# Patient Record
Sex: Female | Born: 1970 | Race: Black or African American | Hispanic: No | Smoking: Former smoker
Health system: Southern US, Community
[De-identification: ages and names within clinical notes are randomized; demographics above are authoritative.]

## PROBLEM LIST (undated history)

## (undated) ENCOUNTER — Emergency Department (HOSPITAL_COMMUNITY): Disposition: A | Discharge status: Home / Self Care | Payer: Self-pay

## (undated) DIAGNOSIS — Z8711 Personal history of peptic ulcer disease: Secondary | ICD-10-CM

## (undated) DIAGNOSIS — G8929 Other chronic pain: Secondary | ICD-10-CM

## (undated) DIAGNOSIS — J45909 Unspecified asthma, uncomplicated: Secondary | ICD-10-CM

## (undated) DIAGNOSIS — Z8719 Personal history of other diseases of the digestive system: Secondary | ICD-10-CM

## (undated) DIAGNOSIS — F4310 Post-traumatic stress disorder, unspecified: Secondary | ICD-10-CM

## (undated) DIAGNOSIS — M549 Dorsalgia, unspecified: Secondary | ICD-10-CM

## (undated) DIAGNOSIS — M1990 Unspecified osteoarthritis, unspecified site: Secondary | ICD-10-CM

## (undated) DIAGNOSIS — A749 Chlamydial infection, unspecified: Secondary | ICD-10-CM

## (undated) DIAGNOSIS — R0789 Other chest pain: Secondary | ICD-10-CM

## (undated) DIAGNOSIS — M542 Cervicalgia: Secondary | ICD-10-CM

## (undated) DIAGNOSIS — K922 Gastrointestinal hemorrhage, unspecified: Secondary | ICD-10-CM

## (undated) DIAGNOSIS — F419 Anxiety disorder, unspecified: Secondary | ICD-10-CM

## (undated) DIAGNOSIS — K219 Gastro-esophageal reflux disease without esophagitis: Secondary | ICD-10-CM

## (undated) HISTORY — DX: Anxiety disorder, unspecified: F41.9

## (undated) HISTORY — PX: ENDOMETRIAL ABLATION: SHX621

## (undated) HISTORY — PX: OTHER SURGICAL HISTORY: SHX169

---

## 2003-11-22 ENCOUNTER — Inpatient Hospital Stay (HOSPITAL_COMMUNITY): Admission: AD | Admit: 2003-11-22 | Discharge: 2003-11-22 | Payer: Self-pay | Admitting: *Deleted

## 2004-01-09 ENCOUNTER — Other Ambulatory Visit: Admission: RE | Admit: 2004-01-09 | Discharge: 2004-01-09 | Payer: Self-pay | Admitting: *Deleted

## 2004-10-14 ENCOUNTER — Emergency Department (HOSPITAL_COMMUNITY): Admission: EM | Admit: 2004-10-14 | Discharge: 2004-10-15 | Payer: Self-pay

## 2004-11-09 ENCOUNTER — Emergency Department (HOSPITAL_COMMUNITY): Admission: EM | Admit: 2004-11-09 | Discharge: 2004-11-09 | Payer: Self-pay | Admitting: Emergency Medicine

## 2004-12-30 DIAGNOSIS — F4310 Post-traumatic stress disorder, unspecified: Secondary | ICD-10-CM

## 2004-12-30 HISTORY — PX: OTHER SURGICAL HISTORY: SHX169

## 2004-12-30 HISTORY — DX: Post-traumatic stress disorder, unspecified: F43.10

## 2005-03-25 ENCOUNTER — Emergency Department (HOSPITAL_COMMUNITY): Admission: EM | Admit: 2005-03-25 | Discharge: 2005-03-26 | Payer: Self-pay | Admitting: Emergency Medicine

## 2005-06-17 ENCOUNTER — Emergency Department (HOSPITAL_COMMUNITY): Admission: EM | Admit: 2005-06-17 | Discharge: 2005-06-17 | Payer: Self-pay | Admitting: Emergency Medicine

## 2005-09-23 ENCOUNTER — Emergency Department (HOSPITAL_COMMUNITY): Admission: EM | Admit: 2005-09-23 | Discharge: 2005-09-23 | Payer: Self-pay | Admitting: Emergency Medicine

## 2005-11-10 ENCOUNTER — Inpatient Hospital Stay (HOSPITAL_COMMUNITY): Admission: EM | Admit: 2005-11-10 | Discharge: 2005-11-13 | Payer: Self-pay | Admitting: *Deleted

## 2005-11-13 ENCOUNTER — Emergency Department (HOSPITAL_COMMUNITY): Admission: EM | Admit: 2005-11-13 | Discharge: 2005-11-13 | Payer: Self-pay | Admitting: Emergency Medicine

## 2010-11-11 ENCOUNTER — Emergency Department (HOSPITAL_COMMUNITY): Admission: EM | Admit: 2010-11-11 | Discharge: 2010-11-11 | Payer: Self-pay | Admitting: Emergency Medicine

## 2011-01-08 ENCOUNTER — Emergency Department (HOSPITAL_COMMUNITY)
Admission: EM | Admit: 2011-01-08 | Discharge: 2011-01-08 | Payer: Self-pay | Source: Home / Self Care | Admitting: Emergency Medicine

## 2011-01-14 LAB — URINALYSIS, ROUTINE W REFLEX MICROSCOPIC
Bilirubin Urine: NEGATIVE
Hgb urine dipstick: NEGATIVE
Ketones, ur: NEGATIVE mg/dL
Nitrite: NEGATIVE
Protein, ur: NEGATIVE mg/dL
Specific Gravity, Urine: 1.017 (ref 1.005–1.030)
Urine Glucose, Fasting: NEGATIVE mg/dL
Urobilinogen, UA: 0.2 mg/dL (ref 0.0–1.0)
pH: 7 (ref 5.0–8.0)

## 2011-03-08 ENCOUNTER — Emergency Department (HOSPITAL_COMMUNITY)
Admission: EM | Admit: 2011-03-08 | Discharge: 2011-03-08 | Disposition: A | Discharge status: Home / Self Care | Payer: Medicaid Other | Attending: Emergency Medicine | Admitting: Emergency Medicine

## 2011-03-08 DIAGNOSIS — S335XXA Sprain of ligaments of lumbar spine, initial encounter: Secondary | ICD-10-CM | POA: Insufficient documentation

## 2011-03-08 DIAGNOSIS — K219 Gastro-esophageal reflux disease without esophagitis: Secondary | ICD-10-CM | POA: Insufficient documentation

## 2011-03-08 DIAGNOSIS — M542 Cervicalgia: Secondary | ICD-10-CM | POA: Insufficient documentation

## 2011-03-08 DIAGNOSIS — F3289 Other specified depressive episodes: Secondary | ICD-10-CM | POA: Insufficient documentation

## 2011-03-08 DIAGNOSIS — F329 Major depressive disorder, single episode, unspecified: Secondary | ICD-10-CM | POA: Insufficient documentation

## 2011-03-08 DIAGNOSIS — M545 Low back pain, unspecified: Secondary | ICD-10-CM | POA: Insufficient documentation

## 2011-03-08 DIAGNOSIS — S139XXA Sprain of joints and ligaments of unspecified parts of neck, initial encounter: Secondary | ICD-10-CM | POA: Insufficient documentation

## 2011-04-10 ENCOUNTER — Ambulatory Visit
Payer: No Typology Code available for payment source | Attending: Physical Medicine and Rehabilitation | Admitting: Physical Therapy

## 2011-04-10 DIAGNOSIS — M545 Low back pain, unspecified: Secondary | ICD-10-CM | POA: Insufficient documentation

## 2011-04-10 DIAGNOSIS — M2560 Stiffness of unspecified joint, not elsewhere classified: Secondary | ICD-10-CM | POA: Insufficient documentation

## 2011-04-10 DIAGNOSIS — IMO0001 Reserved for inherently not codable concepts without codable children: Secondary | ICD-10-CM | POA: Insufficient documentation

## 2011-04-18 ENCOUNTER — Emergency Department (HOSPITAL_COMMUNITY)
Admission: EM | Admit: 2011-04-18 | Discharge: 2011-04-18 | Disposition: A | Discharge status: Home / Self Care | Payer: No Typology Code available for payment source | Attending: Emergency Medicine | Admitting: Emergency Medicine

## 2011-04-18 ENCOUNTER — Emergency Department (HOSPITAL_COMMUNITY): Payer: No Typology Code available for payment source

## 2011-04-18 DIAGNOSIS — R209 Unspecified disturbances of skin sensation: Secondary | ICD-10-CM | POA: Insufficient documentation

## 2011-04-18 DIAGNOSIS — Z87828 Personal history of other (healed) physical injury and trauma: Secondary | ICD-10-CM | POA: Insufficient documentation

## 2011-04-18 DIAGNOSIS — M545 Low back pain, unspecified: Secondary | ICD-10-CM | POA: Insufficient documentation

## 2011-04-18 DIAGNOSIS — F17200 Nicotine dependence, unspecified, uncomplicated: Secondary | ICD-10-CM | POA: Insufficient documentation

## 2011-04-18 DIAGNOSIS — M549 Dorsalgia, unspecified: Secondary | ICD-10-CM | POA: Insufficient documentation

## 2011-04-18 DIAGNOSIS — G8929 Other chronic pain: Secondary | ICD-10-CM | POA: Insufficient documentation

## 2011-04-18 LAB — URINALYSIS, ROUTINE W REFLEX MICROSCOPIC
Bilirubin Urine: NEGATIVE
Glucose, UA: NEGATIVE mg/dL
Ketones, ur: NEGATIVE mg/dL
Leukocytes, UA: NEGATIVE
Nitrite: NEGATIVE
Protein, ur: NEGATIVE mg/dL
Specific Gravity, Urine: 1.007 (ref 1.005–1.030)
Urobilinogen, UA: 0.2 mg/dL (ref 0.0–1.0)
pH: 6.5 (ref 5.0–8.0)

## 2011-04-18 LAB — POCT I-STAT, CHEM 8
BUN: 11 mg/dL (ref 6–23)
Calcium, Ion: 1.04 mmol/L — ABNORMAL LOW (ref 1.12–1.32)
Chloride: 105 mEq/L (ref 96–112)
Creatinine, Ser: 1 mg/dL (ref 0.4–1.2)
Glucose, Bld: 82 mg/dL (ref 70–99)
HCT: 46 % (ref 36.0–46.0)
Hemoglobin: 15.6 g/dL — ABNORMAL HIGH (ref 12.0–15.0)
Potassium: 4.4 mEq/L (ref 3.5–5.1)
Sodium: 139 mEq/L (ref 135–145)
TCO2: 22 mmol/L (ref 0–100)

## 2011-04-18 LAB — URINE MICROSCOPIC-ADD ON

## 2011-04-19 LAB — POCT PREGNANCY, URINE: Preg Test, Ur: NEGATIVE

## 2011-04-22 ENCOUNTER — Ambulatory Visit: Payer: No Typology Code available for payment source | Admitting: Physical Therapy

## 2011-04-24 ENCOUNTER — Encounter: Payer: No Typology Code available for payment source | Admitting: Physical Therapy

## 2011-04-29 ENCOUNTER — Ambulatory Visit: Payer: No Typology Code available for payment source | Admitting: Physical Therapy

## 2011-05-01 ENCOUNTER — Encounter: Payer: No Typology Code available for payment source | Admitting: Physical Therapy

## 2011-05-06 ENCOUNTER — Ambulatory Visit
Payer: No Typology Code available for payment source | Attending: Physical Medicine and Rehabilitation | Admitting: Physical Therapy

## 2011-05-06 DIAGNOSIS — IMO0001 Reserved for inherently not codable concepts without codable children: Secondary | ICD-10-CM | POA: Insufficient documentation

## 2011-05-06 DIAGNOSIS — M2560 Stiffness of unspecified joint, not elsewhere classified: Secondary | ICD-10-CM | POA: Insufficient documentation

## 2011-05-06 DIAGNOSIS — M545 Low back pain, unspecified: Secondary | ICD-10-CM | POA: Insufficient documentation

## 2011-05-15 ENCOUNTER — Inpatient Hospital Stay (INDEPENDENT_AMBULATORY_CARE_PROVIDER_SITE_OTHER)
Admission: RE | Admit: 2011-05-15 | Discharge: 2011-05-15 | Disposition: A | Discharge status: Home / Self Care | Payer: Self-pay | Source: Ambulatory Visit | Attending: Family Medicine | Admitting: Family Medicine

## 2011-05-15 DIAGNOSIS — R6889 Other general symptoms and signs: Secondary | ICD-10-CM

## 2011-05-17 NOTE — Discharge Summary (Signed)
NAME:  MEDRITH, VEILLON NO.:  [id number]   MEDICAL RECORD NO.:  [id number]          PATIENT TYPE:  INP   LOCATION:  5005                         FACILITY:  [facility]   PHYSICIAN:  Sheppard Penton. Stacie Acres, M.D.  DATE OF BIRTH:  07-Oct-1971   DATE OF ADMISSION:  11/10/2005  DATE OF DISCHARGE:  11/13/2005                                 DISCHARGE SUMMARY   ADDENDUM:  Ms. Christell Constant did not to discharge yesterday as planned because of  lack of placement. Today the plan is to discharge her to the [facility] where she can check herself into rehab if she so chooses.  Nothing else changed in her hospital course nor in her discharge planning.  Thank you      Earney Hamburg, P.A.    ______________________________  Sheppard Penton. Stacie Acres, M.D.    MJ/MEDQ  D:  11/13/2005  T:  11/13/2005  Job:  [id number]
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
## 2011-06-03 ENCOUNTER — Emergency Department (HOSPITAL_COMMUNITY)
Admission: EM | Admit: 2011-06-03 | Discharge: 2011-06-03 | Disposition: A | Discharge status: Home / Self Care | Payer: Medicaid Other | Attending: Emergency Medicine | Admitting: Emergency Medicine

## 2011-06-03 ENCOUNTER — Emergency Department (HOSPITAL_COMMUNITY): Payer: Medicaid Other

## 2011-06-03 DIAGNOSIS — R062 Wheezing: Secondary | ICD-10-CM | POA: Insufficient documentation

## 2011-06-03 DIAGNOSIS — R6883 Chills (without fever): Secondary | ICD-10-CM | POA: Insufficient documentation

## 2011-06-03 DIAGNOSIS — J40 Bronchitis, not specified as acute or chronic: Secondary | ICD-10-CM | POA: Insufficient documentation

## 2011-06-03 DIAGNOSIS — R059 Cough, unspecified: Secondary | ICD-10-CM | POA: Insufficient documentation

## 2011-06-03 DIAGNOSIS — R0602 Shortness of breath: Secondary | ICD-10-CM | POA: Insufficient documentation

## 2011-06-03 DIAGNOSIS — R05 Cough: Secondary | ICD-10-CM | POA: Insufficient documentation

## 2011-06-03 DIAGNOSIS — R0989 Other specified symptoms and signs involving the circulatory and respiratory systems: Secondary | ICD-10-CM | POA: Insufficient documentation

## 2011-06-03 DIAGNOSIS — J069 Acute upper respiratory infection, unspecified: Secondary | ICD-10-CM | POA: Insufficient documentation

## 2011-06-03 DIAGNOSIS — F3289 Other specified depressive episodes: Secondary | ICD-10-CM | POA: Insufficient documentation

## 2011-06-03 DIAGNOSIS — R0982 Postnasal drip: Secondary | ICD-10-CM | POA: Insufficient documentation

## 2011-06-03 DIAGNOSIS — F329 Major depressive disorder, single episode, unspecified: Secondary | ICD-10-CM | POA: Insufficient documentation

## 2011-06-03 DIAGNOSIS — R0609 Other forms of dyspnea: Secondary | ICD-10-CM | POA: Insufficient documentation

## 2011-06-03 DIAGNOSIS — J3489 Other specified disorders of nose and nasal sinuses: Secondary | ICD-10-CM | POA: Insufficient documentation

## 2011-06-03 LAB — CBC
HCT: 40.7 % (ref 36.0–46.0)
Hemoglobin: 13.9 g/dL (ref 12.0–15.0)
MCH: 30.5 pg (ref 26.0–34.0)
MCHC: 34.2 g/dL (ref 30.0–36.0)
MCV: 89.5 fL (ref 78.0–100.0)

## 2011-06-03 LAB — BASIC METABOLIC PANEL
BUN: 11 mg/dL (ref 6–23)
CO2: 26 mEq/L (ref 19–32)
Calcium: 8.9 mg/dL (ref 8.4–10.5)
Creatinine, Ser: 0.76 mg/dL (ref 0.4–1.2)
Glucose, Bld: 98 mg/dL (ref 70–99)

## 2011-06-03 LAB — DIFFERENTIAL
Basophils Absolute: 0 10*3/uL (ref 0.0–0.1)
Basophils Relative: 0 % (ref 0–1)
Eosinophils Absolute: 0.2 10*3/uL (ref 0.0–0.7)
Eosinophils Relative: 3 % (ref 0–5)
Lymphocytes Relative: 35 % (ref 12–46)
Lymphs Abs: 2.6 10*3/uL (ref 0.7–4.0)
Monocytes Absolute: 1 10*3/uL (ref 0.1–1.0)
Monocytes Relative: 13 % — ABNORMAL HIGH (ref 3–12)
Neutro Abs: 3.8 10*3/uL (ref 1.7–7.7)
Neutrophils Relative %: 50 % (ref 43–77)

## 2011-06-05 ENCOUNTER — Institutional Professional Consult (permissible substitution): Payer: Self-pay | Admitting: Internal Medicine

## 2011-06-13 ENCOUNTER — Ambulatory Visit: Payer: No Typology Code available for payment source | Admitting: Physical Therapy

## 2011-06-18 ENCOUNTER — Ambulatory Visit
Payer: No Typology Code available for payment source | Attending: Physical Medicine and Rehabilitation | Admitting: Physical Therapy

## 2011-06-18 DIAGNOSIS — M545 Low back pain, unspecified: Secondary | ICD-10-CM | POA: Insufficient documentation

## 2011-06-18 DIAGNOSIS — IMO0001 Reserved for inherently not codable concepts without codable children: Secondary | ICD-10-CM | POA: Insufficient documentation

## 2011-06-18 DIAGNOSIS — M2560 Stiffness of unspecified joint, not elsewhere classified: Secondary | ICD-10-CM | POA: Insufficient documentation

## 2011-06-21 ENCOUNTER — Ambulatory Visit: Payer: No Typology Code available for payment source | Admitting: Physical Therapy

## 2011-06-29 ENCOUNTER — Emergency Department (HOSPITAL_COMMUNITY)
Admission: EM | Admit: 2011-06-29 | Discharge: 2011-06-29 | Disposition: A | Discharge status: Home / Self Care | Payer: Medicaid Other | Attending: Emergency Medicine | Admitting: Emergency Medicine

## 2011-06-29 DIAGNOSIS — R51 Headache: Secondary | ICD-10-CM | POA: Insufficient documentation

## 2011-06-29 DIAGNOSIS — R110 Nausea: Secondary | ICD-10-CM | POA: Insufficient documentation

## 2011-06-29 DIAGNOSIS — H53149 Visual discomfort, unspecified: Secondary | ICD-10-CM | POA: Insufficient documentation

## 2011-07-02 ENCOUNTER — Ambulatory Visit
Payer: No Typology Code available for payment source | Attending: Physical Medicine and Rehabilitation | Admitting: Physical Therapy

## 2011-07-02 DIAGNOSIS — M545 Low back pain, unspecified: Secondary | ICD-10-CM | POA: Insufficient documentation

## 2011-07-02 DIAGNOSIS — IMO0001 Reserved for inherently not codable concepts without codable children: Secondary | ICD-10-CM | POA: Insufficient documentation

## 2011-07-02 DIAGNOSIS — M2560 Stiffness of unspecified joint, not elsewhere classified: Secondary | ICD-10-CM | POA: Insufficient documentation

## 2011-07-05 ENCOUNTER — Ambulatory Visit: Payer: No Typology Code available for payment source | Admitting: Physical Therapy

## 2011-07-09 ENCOUNTER — Ambulatory Visit: Payer: No Typology Code available for payment source | Admitting: Physical Therapy

## 2011-07-22 ENCOUNTER — Emergency Department (HOSPITAL_COMMUNITY)
Admission: EM | Admit: 2011-07-22 | Discharge: 2011-07-22 | Disposition: A | Discharge status: Home / Self Care | Payer: No Typology Code available for payment source | Attending: Emergency Medicine | Admitting: Emergency Medicine

## 2011-07-22 DIAGNOSIS — M542 Cervicalgia: Secondary | ICD-10-CM | POA: Insufficient documentation

## 2011-07-22 DIAGNOSIS — R197 Diarrhea, unspecified: Secondary | ICD-10-CM | POA: Insufficient documentation

## 2011-07-22 DIAGNOSIS — G8929 Other chronic pain: Secondary | ICD-10-CM | POA: Insufficient documentation

## 2011-08-01 ENCOUNTER — Ambulatory Visit
Payer: No Typology Code available for payment source | Attending: Physical Medicine and Rehabilitation | Admitting: Physical Therapy

## 2011-08-01 DIAGNOSIS — M2560 Stiffness of unspecified joint, not elsewhere classified: Secondary | ICD-10-CM | POA: Insufficient documentation

## 2011-08-01 DIAGNOSIS — IMO0001 Reserved for inherently not codable concepts without codable children: Secondary | ICD-10-CM | POA: Insufficient documentation

## 2011-08-01 DIAGNOSIS — M545 Low back pain, unspecified: Secondary | ICD-10-CM | POA: Insufficient documentation

## 2011-08-07 ENCOUNTER — Ambulatory Visit: Payer: No Typology Code available for payment source | Admitting: Physical Therapy

## 2011-08-09 ENCOUNTER — Encounter: Payer: Self-pay | Admitting: Physical Therapy

## 2011-08-21 ENCOUNTER — Inpatient Hospital Stay (HOSPITAL_COMMUNITY)
Admission: AD | Admit: 2011-08-21 | Discharge: 2011-08-21 | Disposition: A | Discharge status: Home / Self Care | Payer: Medicaid Other | Source: Ambulatory Visit | Attending: Obstetrics & Gynecology | Admitting: Obstetrics & Gynecology

## 2011-08-21 ENCOUNTER — Encounter (HOSPITAL_COMMUNITY): Payer: Self-pay | Admitting: *Deleted

## 2011-08-21 DIAGNOSIS — M545 Low back pain, unspecified: Secondary | ICD-10-CM | POA: Insufficient documentation

## 2011-08-21 DIAGNOSIS — R519 Headache, unspecified: Secondary | ICD-10-CM

## 2011-08-21 DIAGNOSIS — M542 Cervicalgia: Secondary | ICD-10-CM | POA: Insufficient documentation

## 2011-08-21 DIAGNOSIS — R51 Headache: Secondary | ICD-10-CM

## 2011-08-21 DIAGNOSIS — G44209 Tension-type headache, unspecified, not intractable: Secondary | ICD-10-CM | POA: Insufficient documentation

## 2011-08-21 HISTORY — DX: Gastro-esophageal reflux disease without esophagitis: K21.9

## 2011-08-21 MED ORDER — KETOROLAC TROMETHAMINE 60 MG/2ML IM SOLN
60.0000 mg | Freq: Once | INTRAMUSCULAR | Status: AC
  Administered 2011-08-21: 60 mg via INTRAMUSCULAR

## 2011-08-21 MED ORDER — NAPROXEN SODIUM 550 MG PO TABS: 550.0000 mg | ORAL_TABLET | Freq: Two times a day (BID) | ORAL | Status: DC

## 2011-08-21 NOTE — Progress Notes (Signed)
Pt in c/o severe migraine, neck ache, and lower back pain.  States she has been dealing with this since January.  States she has been going to hospital every other week for pain shots.  States the pain this time started about a hour ago,  [facility] daughter that just had a baby.
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
## 2011-08-21 NOTE — Progress Notes (Signed)
Pt states she has had a headache  For about an hour pt states she has been up her with her daughter who just had a baby. Pt states she has not been able to sleep.Pt states she has a new doctor who has been trying to get all her information from all her doctors and hospitals before she will prescribe pain medication.Pt states she was told to come to the ER.

## 2011-09-06 ENCOUNTER — Emergency Department (HOSPITAL_COMMUNITY)
Admission: EM | Admit: 2011-09-06 | Discharge: 2011-09-06 | Disposition: A | Discharge status: Home / Self Care | Payer: No Typology Code available for payment source | Attending: Emergency Medicine | Admitting: Emergency Medicine

## 2011-09-06 DIAGNOSIS — M549 Dorsalgia, unspecified: Secondary | ICD-10-CM | POA: Insufficient documentation

## 2011-09-06 DIAGNOSIS — K219 Gastro-esophageal reflux disease without esophagitis: Secondary | ICD-10-CM | POA: Insufficient documentation

## 2011-09-06 DIAGNOSIS — G8929 Other chronic pain: Secondary | ICD-10-CM | POA: Insufficient documentation

## 2011-09-06 DIAGNOSIS — M542 Cervicalgia: Secondary | ICD-10-CM | POA: Insufficient documentation

## 2011-10-31 ENCOUNTER — Emergency Department (HOSPITAL_COMMUNITY)
Admission: EM | Admit: 2011-10-31 | Discharge: 2011-10-31 | Disposition: A | Discharge status: Home / Self Care | Payer: Medicaid Other | Attending: Emergency Medicine | Admitting: Emergency Medicine

## 2011-10-31 DIAGNOSIS — R0789 Other chest pain: Secondary | ICD-10-CM | POA: Insufficient documentation

## 2011-10-31 DIAGNOSIS — R209 Unspecified disturbances of skin sensation: Secondary | ICD-10-CM | POA: Insufficient documentation

## 2011-10-31 DIAGNOSIS — M549 Dorsalgia, unspecified: Secondary | ICD-10-CM | POA: Insufficient documentation

## 2011-10-31 DIAGNOSIS — G8929 Other chronic pain: Secondary | ICD-10-CM | POA: Insufficient documentation

## 2011-10-31 DIAGNOSIS — M542 Cervicalgia: Secondary | ICD-10-CM | POA: Insufficient documentation

## 2011-11-26 ENCOUNTER — Encounter (HOSPITAL_COMMUNITY): Payer: Self-pay | Admitting: *Deleted

## 2011-11-26 ENCOUNTER — Emergency Department (HOSPITAL_COMMUNITY)
Admission: EM | Admit: 2011-11-26 | Discharge: 2011-11-26 | Discharge status: Against Medical Advice | Payer: Medicaid Other | Attending: Emergency Medicine | Admitting: Emergency Medicine

## 2011-11-26 DIAGNOSIS — R070 Pain in throat: Secondary | ICD-10-CM | POA: Insufficient documentation

## 2011-11-26 DIAGNOSIS — M542 Cervicalgia: Secondary | ICD-10-CM | POA: Insufficient documentation

## 2011-11-26 NOTE — ED Notes (Signed)
She has a cold for 2-3 days with sorethroat.  She has also had neck pain for 1-2 years

## 2011-11-26 NOTE — ED Notes (Signed)
Called and no answer.

## 2011-12-05 ENCOUNTER — Emergency Department (HOSPITAL_COMMUNITY)
Admission: EM | Admit: 2011-12-05 | Discharge: 2011-12-05 | Disposition: A | Discharge status: Home / Self Care | Payer: Medicaid Other | Attending: Emergency Medicine | Admitting: Emergency Medicine

## 2011-12-05 ENCOUNTER — Encounter (HOSPITAL_COMMUNITY): Payer: Self-pay | Admitting: Emergency Medicine

## 2011-12-05 DIAGNOSIS — R220 Localized swelling, mass and lump, head: Secondary | ICD-10-CM | POA: Insufficient documentation

## 2011-12-05 DIAGNOSIS — M545 Low back pain, unspecified: Secondary | ICD-10-CM | POA: Insufficient documentation

## 2011-12-05 DIAGNOSIS — F329 Major depressive disorder, single episode, unspecified: Secondary | ICD-10-CM | POA: Insufficient documentation

## 2011-12-05 DIAGNOSIS — M542 Cervicalgia: Secondary | ICD-10-CM | POA: Insufficient documentation

## 2011-12-05 DIAGNOSIS — G8929 Other chronic pain: Secondary | ICD-10-CM

## 2011-12-05 DIAGNOSIS — J45909 Unspecified asthma, uncomplicated: Secondary | ICD-10-CM | POA: Insufficient documentation

## 2011-12-05 DIAGNOSIS — F3289 Other specified depressive episodes: Secondary | ICD-10-CM | POA: Insufficient documentation

## 2011-12-05 DIAGNOSIS — R209 Unspecified disturbances of skin sensation: Secondary | ICD-10-CM | POA: Insufficient documentation

## 2011-12-05 DIAGNOSIS — R070 Pain in throat: Secondary | ICD-10-CM | POA: Insufficient documentation

## 2011-12-05 DIAGNOSIS — M549 Dorsalgia, unspecified: Secondary | ICD-10-CM

## 2011-12-05 DIAGNOSIS — K219 Gastro-esophageal reflux disease without esophagitis: Secondary | ICD-10-CM | POA: Insufficient documentation

## 2011-12-05 HISTORY — DX: Dorsalgia, unspecified: M54.9

## 2011-12-05 HISTORY — DX: Other chronic pain: G89.29

## 2011-12-05 MED ORDER — OXYCODONE-ACETAMINOPHEN 5-325 MG PO TABS
ORAL_TABLET | ORAL | Status: AC
  Filled 2011-12-05: qty 2

## 2011-12-05 MED ORDER — DIPHENHYDRAMINE HCL 50 MG/ML IJ SOLN
25.0000 mg | Freq: Once | INTRAMUSCULAR | Status: AC
  Administered 2011-12-05: 25 mg via INTRAMUSCULAR

## 2011-12-05 MED ORDER — OXYCODONE-ACETAMINOPHEN 5-325 MG PO TABS: 2.0000 | ORAL_TABLET | Freq: Once | ORAL | Status: AC

## 2011-12-05 MED ORDER — HYDROMORPHONE HCL PF 2 MG/ML IJ SOLN
2.0000 mg | Freq: Once | INTRAMUSCULAR | Status: AC
  Administered 2011-12-05: 2 mg via INTRAMUSCULAR
  Filled 2011-12-05: qty 1

## 2011-12-05 MED ORDER — DIPHENHYDRAMINE HCL 50 MG/ML IJ SOLN: 25.0000 mg | Freq: Once | INTRAMUSCULAR | Status: DC

## 2011-12-05 MED ORDER — DIPHENHYDRAMINE HCL 50 MG/ML IJ SOLN
INTRAMUSCULAR | Status: AC
  Administered 2011-12-05: 25 mg via INTRAMUSCULAR
  Filled 2011-12-05: qty 1
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
## 2011-12-05 NOTE — ED Notes (Signed)
Discussed dilaudid allergy with pt, Md and pharmacy,  Pt states normally does well with benadryl.

## 2011-12-05 NOTE — ED Notes (Signed)
Pt ate all of bagged meal and tolerated well. No distress. Waiting for cab voucher.
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
## 2011-12-05 NOTE — Consult Note (Signed)
Clinical Social Work received consult for transportation issue.  Patient reports she cannot walk 2 blocks due to falling frequently, thus needs a cab voucher. CSW approved cab voucher and spoke with supervisor. Called [employer] and patient was dc by cab to home.  No other needs.  Ashley Jacobs, MSW LCSWA [phone]  1133am

## 2011-12-16 ENCOUNTER — Emergency Department (HOSPITAL_COMMUNITY)
Admission: EM | Admit: 2011-12-16 | Discharge: 2011-12-16 | Disposition: A | Discharge status: Home / Self Care | Payer: Medicaid Other | Attending: Emergency Medicine | Admitting: Emergency Medicine

## 2011-12-16 ENCOUNTER — Encounter (HOSPITAL_COMMUNITY): Payer: Self-pay | Admitting: Emergency Medicine

## 2011-12-16 DIAGNOSIS — M545 Low back pain, unspecified: Secondary | ICD-10-CM | POA: Insufficient documentation

## 2011-12-16 DIAGNOSIS — G8929 Other chronic pain: Secondary | ICD-10-CM | POA: Insufficient documentation

## 2011-12-16 DIAGNOSIS — K219 Gastro-esophageal reflux disease without esophagitis: Secondary | ICD-10-CM | POA: Insufficient documentation

## 2011-12-16 DIAGNOSIS — M542 Cervicalgia: Secondary | ICD-10-CM | POA: Insufficient documentation

## 2011-12-16 MED ORDER — DIPHENHYDRAMINE HCL 50 MG/ML IJ SOLN
INTRAMUSCULAR | Status: AC
  Administered 2011-12-16: 25 mg via INTRAVENOUS
  Filled 2011-12-16: qty 1

## 2011-12-16 MED ORDER — DIPHENHYDRAMINE HCL 50 MG/ML IJ SOLN
25.0000 mg | Freq: Once | INTRAMUSCULAR | Status: AC
  Administered 2011-12-16: 25 mg via INTRAVENOUS

## 2011-12-16 MED ORDER — OXYCODONE HCL 15 MG PO TB12: 15.0000 mg | ORAL_TABLET | Freq: Two times a day (BID) | ORAL | Status: DC

## 2011-12-16 MED ORDER — DIPHENHYDRAMINE HCL 50 MG/ML IJ SOLN
25.0000 mg | Freq: Once | INTRAMUSCULAR | Status: AC
  Administered 2011-12-16: 25 mg via INTRAVENOUS
  Filled 2011-12-16: qty 1

## 2011-12-16 MED ORDER — HYDROMORPHONE HCL PF 1 MG/ML IJ SOLN
2.0000 mg | Freq: Once | INTRAMUSCULAR | Status: AC
  Administered 2011-12-16: 2 mg via INTRAVENOUS
  Filled 2011-12-16: qty 2

## 2011-12-16 MED ORDER — HYDROMORPHONE HCL PF 1 MG/ML IJ SOLN
1.0000 mg | Freq: Once | INTRAMUSCULAR | Status: AC
  Administered 2011-12-16: 1 mg via INTRAVENOUS
  Filled 2011-12-16: qty 1

## 2011-12-16 NOTE — ED Notes (Signed)
Pt c/o neck pain; pt sts hx of chronic pain; pt sts lower back pain also
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
## 2011-12-16 NOTE — ED Notes (Signed)
Rx x 1, pt voiced understanding to f/u with PCP. 

## 2011-12-16 NOTE — ED Notes (Signed)
Pt c/o itching after dilaudid.  Asking for food and cab voucher.

## 2011-12-28 ENCOUNTER — Emergency Department (HOSPITAL_COMMUNITY)
Admission: EM | Admit: 2011-12-28 | Discharge: 2011-12-29 | Disposition: A | Discharge status: Home / Self Care | Payer: Medicaid Other | Attending: Emergency Medicine | Admitting: Emergency Medicine

## 2011-12-28 ENCOUNTER — Encounter (HOSPITAL_COMMUNITY): Payer: Self-pay | Admitting: *Deleted

## 2011-12-28 DIAGNOSIS — G8929 Other chronic pain: Secondary | ICD-10-CM

## 2011-12-28 DIAGNOSIS — H53149 Visual discomfort, unspecified: Secondary | ICD-10-CM | POA: Insufficient documentation

## 2011-12-28 DIAGNOSIS — M542 Cervicalgia: Secondary | ICD-10-CM | POA: Insufficient documentation

## 2011-12-28 DIAGNOSIS — R51 Headache: Secondary | ICD-10-CM

## 2011-12-28 DIAGNOSIS — K219 Gastro-esophageal reflux disease without esophagitis: Secondary | ICD-10-CM | POA: Insufficient documentation

## 2011-12-28 DIAGNOSIS — G43909 Migraine, unspecified, not intractable, without status migrainosus: Secondary | ICD-10-CM | POA: Insufficient documentation

## 2011-12-28 MED ORDER — SODIUM CHLORIDE 0.9 % IV BOLUS (SEPSIS)
1000.0000 mL | Freq: Once | INTRAVENOUS | Status: AC
  Administered 2011-12-28: 1000 mL via INTRAVENOUS

## 2011-12-28 MED ORDER — DIPHENHYDRAMINE HCL 50 MG/ML IJ SOLN
25.0000 mg | Freq: Once | INTRAMUSCULAR | Status: AC
  Administered 2011-12-28: 25 mg via INTRAVENOUS
  Filled 2011-12-28: qty 1

## 2011-12-28 MED ORDER — PROMETHAZINE HCL 25 MG/ML IJ SOLN
12.5000 mg | Freq: Once | INTRAMUSCULAR | Status: AC
  Administered 2011-12-28: 12.5 mg via INTRAVENOUS
  Filled 2011-12-28: qty 1

## 2011-12-28 MED ORDER — DEXAMETHASONE SODIUM PHOSPHATE 4 MG/ML IJ SOLN
10.0000 mg | Freq: Once | INTRAMUSCULAR | Status: AC
  Administered 2011-12-28: 10 mg via INTRAVENOUS
  Filled 2011-12-28: qty 3

## 2011-12-28 MED ORDER — HYDROMORPHONE HCL PF 1 MG/ML IJ SOLN
1.0000 mg | Freq: Once | INTRAMUSCULAR | Status: AC
  Administered 2011-12-28: 1 mg via INTRAVENOUS
  Filled 2011-12-28: qty 1
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
## 2011-12-28 NOTE — ED Provider Notes (Signed)
History     CSN: [id number]  Arrival date & time 12/28/11  1910   First MD Initiated Contact with Patient 12/28/11 2045      Chief Complaint  Patient presents with  . Headache    HPI  History provided by the patient. Patient is a 40 year old female with chronic upper neck pains and history of migraines who presents with complaints of uncontrolled neck pain and right-sided migraine headache for the past 2-3 days. Patient has had multiple recent visits to her room for similar complaints of neck pains. She states she has an upcoming appointment on January 2 with her orthopedic specialist. She denies any new symptoms such as the headache is typical for her. Patient tried taking over-the-counter migraine medications and her prescribed Percocets at home for pains without improvement. Patient reports some associated photophobia. She denies any nausea vomiting with decreased appetite. She denies any weakness, numbness or tingling in extremities. Patient has no other significant past medical history.    Past Medical History  Diagnosis Date  . Acid reflux   . Migraine   . Back pain, chronic     History reviewed. No pertinent past surgical history.  No family history on file.  History  Substance Use Topics  . Smoking status: Current Everyday Smoker  . Smokeless tobacco: Never Used  . Alcohol Use: No    OB History    Grav Para Term Preterm Abortions TAB SAB Ect Mult Living                  Review of Systems  Constitutional: Negative for fever and chills.  Eyes: Positive for photophobia.  Gastrointestinal: Negative for nausea and vomiting.  Neurological: Positive for headaches. Negative for weakness and numbness.    Allergies  Dilaudid; Tramadol; Naproxen; and Darvocet  Home Medications   Current Outpatient Rx  Name Route Sig Dispense Refill  . OMEPRAZOLE 40 MG PO CPDR Oral Take 40 mg by mouth daily.      . OXYCODONE-ACETAMINOPHEN 10-325 MG PO TABS Oral Take 1-2 tablets  by mouth 2 (two) times daily as needed. For pain      BP 121/81  Pulse 67  Temp(Src) 98.1 F (36.7 C) (Oral)  Resp 20  SpO2 96%  Physical Exam  Nursing note and vitals reviewed. Constitutional: She is oriented to person, place, and time. She appears well-developed and well-nourished. No distress.  HENT:  Head: Normocephalic.  Eyes: Conjunctivae and EOM are normal. Pupils are equal, round, and reactive to light.  Neck: Normal range of motion. Neck supple. No tracheal deviation present.       Bilateral posterior tenderness to palpation. Mild to moderate pains long cervical spine. No obvious deformity. No crepitus.  Cardiovascular: Normal rate and regular rhythm.   Pulmonary/Chest: Effort normal and breath sounds normal.  Abdominal: Soft.  Musculoskeletal:       Thoracic back: Normal.       Lumbar back: Normal.  Neurological: She is alert and oriented to person, place, and time. She has normal strength. No cranial nerve deficit or sensory deficit.       Normal strength in all extremities. All distal sensations and pulses normal.  Skin: Skin is warm and dry. No rash noted.  Psychiatric: She has a normal mood and affect. Her behavior is normal.    ED Course  Procedures     1. Chronic neck pain   2. Headache       MDM  9:30 PM patient seen and  evaluated. Patient no acute distress.  Patient returns for exacerbation of chronic pains with the addition of right migraine headache. Patient reports having upcoming appointment with her orthopedic back specialists on January 2. Patient states she has prescriptions for Percocets at home but this has not helped. Plan to treat patient's chronic pains with one dose of medications here. She agrees to this plan.  Medical screening examination/treatment/procedure(s) were performed by non-physician practitioner and as supervising physician I was immediately available for consultation/collaboration. Osvaldo Human, M.D.        Angus Seller, PA 12/28/11 2303  Carleene Cooper III, MD 12/29/11 [phone]

## 2011-12-29 ENCOUNTER — Emergency Department (HOSPITAL_COMMUNITY)
Admission: EM | Admit: 2011-12-29 | Discharge: 2011-12-29 | Disposition: A | Discharge status: Home / Self Care | Payer: Medicaid Other | Attending: Emergency Medicine | Admitting: Emergency Medicine

## 2011-12-29 ENCOUNTER — Emergency Department (HOSPITAL_COMMUNITY): Payer: Medicaid Other

## 2011-12-29 ENCOUNTER — Encounter (HOSPITAL_COMMUNITY): Payer: Self-pay | Admitting: *Deleted

## 2011-12-29 DIAGNOSIS — R51 Headache: Secondary | ICD-10-CM

## 2011-12-29 DIAGNOSIS — K219 Gastro-esophageal reflux disease without esophagitis: Secondary | ICD-10-CM | POA: Insufficient documentation

## 2011-12-29 DIAGNOSIS — R110 Nausea: Secondary | ICD-10-CM | POA: Insufficient documentation

## 2011-12-29 DIAGNOSIS — G8929 Other chronic pain: Secondary | ICD-10-CM | POA: Insufficient documentation

## 2011-12-29 DIAGNOSIS — F17200 Nicotine dependence, unspecified, uncomplicated: Secondary | ICD-10-CM | POA: Insufficient documentation

## 2011-12-29 DIAGNOSIS — M542 Cervicalgia: Secondary | ICD-10-CM | POA: Insufficient documentation

## 2011-12-29 DIAGNOSIS — H53149 Visual discomfort, unspecified: Secondary | ICD-10-CM | POA: Insufficient documentation

## 2011-12-29 MED ORDER — HYDROMORPHONE HCL PF 1 MG/ML IJ SOLN
1.0000 mg | Freq: Once | INTRAMUSCULAR | Status: AC
  Administered 2011-12-29: 1 mg via INTRAVENOUS
  Filled 2011-12-29: qty 1

## 2011-12-29 MED ORDER — DIPHENHYDRAMINE HCL 25 MG PO CAPS
25.0000 mg | ORAL_CAPSULE | Freq: Once | ORAL | Status: AC
  Administered 2011-12-29: 25 mg via ORAL
  Filled 2011-12-29: qty 1

## 2011-12-29 MED ORDER — SODIUM CHLORIDE 0.9 % IV BOLUS (SEPSIS)
1000.0000 mL | Freq: Once | INTRAVENOUS | Status: AC
  Administered 2011-12-29: 1000 mL via INTRAVENOUS

## 2011-12-29 MED ORDER — DIPHENHYDRAMINE HCL 50 MG/ML IJ SOLN
25.0000 mg | Freq: Once | INTRAMUSCULAR | Status: AC
  Administered 2011-12-29: 25 mg via INTRAVENOUS
  Filled 2011-12-29: qty 1

## 2011-12-29 MED ORDER — METOCLOPRAMIDE HCL 5 MG/ML IJ SOLN
10.0000 mg | Freq: Once | INTRAMUSCULAR | Status: AC
  Administered 2011-12-29: 10 mg via INTRAVENOUS
  Filled 2011-12-29: qty 2

## 2011-12-29 MED ORDER — DEXAMETHASONE SODIUM PHOSPHATE 10 MG/ML IJ SOLN
10.0000 mg | Freq: Once | INTRAMUSCULAR | Status: AC
  Administered 2011-12-29: 10 mg via INTRAVENOUS
  Filled 2011-12-29: qty 1

## 2011-12-29 NOTE — ED Notes (Signed)
Patient transported to CT 

## 2011-12-29 NOTE — ED Notes (Signed)
Patient with migraine.  Seen last night for same.  Complaining of headache since a day ago, with nausea, no vomiting at this time.  Patient is photophobic and bothered by noises.

## 2011-12-29 NOTE — ED Notes (Signed)
Pt here for same last night, having headache x 4 days. Having n/v and reports nose bleed today. Arrived by [facility].
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
## 2012-01-01 NOTE — ED Provider Notes (Signed)
I saw and evaluated the patient, reviewed the resident's note and I agree with the findings and plan. Migraine. History same. sHe is on narcotics, awakening him from her pain specialist. Also been today's with him. Discharged home. CT unremarkable.  Juliet Rude. Rubin Payor, MD 01/01/12 1438

## 2012-01-18 ENCOUNTER — Encounter (HOSPITAL_COMMUNITY): Payer: Self-pay | Admitting: Emergency Medicine

## 2012-01-18 DIAGNOSIS — G8929 Other chronic pain: Secondary | ICD-10-CM | POA: Insufficient documentation

## 2012-01-18 DIAGNOSIS — M549 Dorsalgia, unspecified: Secondary | ICD-10-CM | POA: Insufficient documentation

## 2012-01-18 DIAGNOSIS — K219 Gastro-esophageal reflux disease without esophagitis: Secondary | ICD-10-CM | POA: Insufficient documentation

## 2012-01-18 DIAGNOSIS — M542 Cervicalgia: Secondary | ICD-10-CM | POA: Insufficient documentation

## 2012-01-19 ENCOUNTER — Emergency Department (HOSPITAL_COMMUNITY): Payer: Medicaid Other

## 2012-01-19 ENCOUNTER — Encounter (HOSPITAL_COMMUNITY): Payer: Self-pay | Admitting: *Deleted

## 2012-01-19 ENCOUNTER — Emergency Department (HOSPITAL_COMMUNITY)
Admission: EM | Admit: 2012-01-19 | Discharge: 2012-01-19 | Disposition: A | Discharge status: Home / Self Care | Payer: Medicaid Other | Attending: Emergency Medicine | Admitting: Emergency Medicine

## 2012-01-19 DIAGNOSIS — Y92009 Unspecified place in unspecified non-institutional (private) residence as the place of occurrence of the external cause: Secondary | ICD-10-CM | POA: Insufficient documentation

## 2012-01-19 DIAGNOSIS — G8929 Other chronic pain: Secondary | ICD-10-CM | POA: Insufficient documentation

## 2012-01-19 DIAGNOSIS — M542 Cervicalgia: Secondary | ICD-10-CM | POA: Insufficient documentation

## 2012-01-19 DIAGNOSIS — K219 Gastro-esophageal reflux disease without esophagitis: Secondary | ICD-10-CM | POA: Insufficient documentation

## 2012-01-19 DIAGNOSIS — M545 Low back pain, unspecified: Secondary | ICD-10-CM | POA: Insufficient documentation

## 2012-01-19 DIAGNOSIS — IMO0001 Reserved for inherently not codable concepts without codable children: Secondary | ICD-10-CM | POA: Insufficient documentation

## 2012-01-19 DIAGNOSIS — W010XXA Fall on same level from slipping, tripping and stumbling without subsequent striking against object, initial encounter: Secondary | ICD-10-CM | POA: Insufficient documentation

## 2012-01-19 DIAGNOSIS — N390 Urinary tract infection, site not specified: Secondary | ICD-10-CM | POA: Insufficient documentation

## 2012-01-19 DIAGNOSIS — R29898 Other symptoms and signs involving the musculoskeletal system: Secondary | ICD-10-CM | POA: Insufficient documentation

## 2012-01-19 DIAGNOSIS — M549 Dorsalgia, unspecified: Secondary | ICD-10-CM

## 2012-01-19 DIAGNOSIS — R109 Unspecified abdominal pain: Secondary | ICD-10-CM | POA: Insufficient documentation

## 2012-01-19 DIAGNOSIS — R350 Frequency of micturition: Secondary | ICD-10-CM | POA: Insufficient documentation

## 2012-01-19 LAB — URINALYSIS, ROUTINE W REFLEX MICROSCOPIC
Glucose, UA: NEGATIVE mg/dL
Ketones, ur: NEGATIVE mg/dL
Protein, ur: NEGATIVE mg/dL
Urobilinogen, UA: 1 mg/dL (ref 0.0–1.0)

## 2012-01-19 MED ORDER — MORPHINE SULFATE 4 MG/ML IJ SOLN
4.0000 mg | Freq: Once | INTRAMUSCULAR | Status: AC
  Administered 2012-01-19: 4 mg via INTRAMUSCULAR
  Filled 2012-01-19: qty 1

## 2012-01-19 MED ORDER — DIPHENHYDRAMINE HCL 50 MG/ML IJ SOLN
25.0000 mg | Freq: Once | INTRAMUSCULAR | Status: AC
  Administered 2012-01-19: 25 mg via INTRAVENOUS
  Filled 2012-01-19: qty 1

## 2012-01-19 MED ORDER — SODIUM CHLORIDE 0.9 % IV SOLN
Freq: Once | INTRAVENOUS | Status: AC
  Administered 2012-01-19: 18:00:00 via INTRAVENOUS

## 2012-01-19 MED ORDER — HYDROMORPHONE HCL PF 2 MG/ML IJ SOLN
2.0000 mg | Freq: Once | INTRAMUSCULAR | Status: AC
  Administered 2012-01-19: 2 mg via INTRAVENOUS
  Filled 2012-01-19: qty 1

## 2012-01-19 MED ORDER — HYDROXYZINE HCL 50 MG PO TABS: 50.0000 mg | ORAL_TABLET | Freq: Every evening | ORAL | Status: DC | PRN

## 2012-01-19 MED ORDER — ONDANSETRON 4 MG PO TBDP
4.0000 mg | ORAL_TABLET | Freq: Once | ORAL | Status: DC
  Filled 2012-01-19: qty 1

## 2012-01-19 MED ORDER — HYDROXYZINE HCL 25 MG PO TABS: 25.0000 mg | ORAL_TABLET | Freq: Once | ORAL | Status: DC

## 2012-01-19 MED ORDER — FENTANYL CITRATE 0.05 MG/ML IJ SOLN
100.0000 ug | Freq: Once | INTRAMUSCULAR | Status: AC
  Administered 2012-01-19: 100 ug via INTRAVENOUS
  Filled 2012-01-19: qty 2

## 2012-01-19 MED ORDER — SODIUM CHLORIDE 0.9 % IV BOLUS (SEPSIS)
1000.0000 mL | Freq: Once | INTRAVENOUS | Status: AC
  Administered 2012-01-19: 1000 mL via INTRAVENOUS

## 2012-01-19 NOTE — ED Notes (Signed)
MD at bedside. 

## 2012-01-19 NOTE — ED Notes (Signed)
Pt presents to department for evaluation of fall this morning. States she was at home and slipped on water in kitchen. States she has chronic back and neck problems. Now states 9/10 neck and back pain. Able to move all extremities without difficulty. No obvious deformities noted. She is conscious alert and oriented x4. Respirations unlabored. Skin warm and dry.

## 2012-01-19 NOTE — ED Notes (Signed)
[id number]<AV> Expected date:01/18/12<BR> Expected time:11:40 PM<BR> Means of arrival:Ambulance<BR> Comments:<BR> Neck pain

## 2012-01-19 NOTE — ED Notes (Signed)
Pt wanted to call 911 for transportation back home. Charge RN contacted [facility], and explained to dispatch that pt, at this she states,with limited ability to ambulate. Pt using cane and walker, for ambulation. Pt, whit +1 assist and cane, was able to go from room to the bathroom. Pt waiting for the [facility] to arrive.
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
## 2012-01-19 NOTE — ED Notes (Signed)
Medicated pt per orders, pt requested medication be pushed in lowest port. notified pt that pain meds will be administered in highest port and pushed slow. Pt states she "cant feel it when you push it high."  Informed pt that the meds are given to control pain not to feel a rush when being administered. Notified provider of pts request.

## 2012-01-19 NOTE — ED Notes (Signed)
Notified provider that pt is requesting pain meds and of itching

## 2012-01-19 NOTE — ED Notes (Signed)
Pt given Fentanyl for pain as ordered.  Pt states she wanted Dilaudid and she does not understand why she wasn't given that instead.  Pt states her throat feels funny as she is eating salad, chicken and ice cream.  Pulse ox applied - O2 sat 100%.  Pt respirations even, unlabored.  Pt noted to be continuously talking.

## 2012-01-19 NOTE — ED Provider Notes (Signed)
History     CSN: [id number]  Arrival date & time 01/19/12  1436   First MD Initiated Contact with Patient 01/19/12 1532      Chief Complaint  Patient presents with  . Fall  . Back Pain    (Consider location/radiation/quality/duration/timing/severity/associated sxs/prior treatment) HPI  Past Medical History  Diagnosis Date  . Acid reflux   . Migraine   . Back pain, chronic     History reviewed. No pertinent past surgical history.  History reviewed. No pertinent family history.  History  Substance Use Topics  . Smoking status: Current Everyday Smoker  . Smokeless tobacco: Never Used  . Alcohol Use: No    OB History    Grav Para Term Preterm Abortions TAB SAB Ect Mult Living                  Review of Systems  Allergies  Tramadol; Naproxen; and Darvocet  Home Medications   Current Outpatient Rx  Name Route Sig Dispense Refill  . HYDROMORPHONE HCL 4 MG PO TABS Oral Take 4 mg by mouth every 4 (four) hours as needed.    Marland Kitchen OMEPRAZOLE 40 MG PO CPDR Oral Take 40 mg by mouth daily.        BP 115/79  Pulse 62  Temp(Src) 97.3 F (36.3 C) (Oral)  Resp 20  SpO2 97%  Physical Exam  ED Course  Procedures (including critical care time)  Labs Reviewed  URINALYSIS, ROUTINE W REFLEX MICROSCOPIC - Abnormal; Notable for the following:    Hgb urine dipstick MODERATE (*)    Leukocytes, UA SMALL (*)    All other components within normal limits  URINE MICROSCOPIC-ADD ON   Dg Cervical Spine Complete  01/19/2012  *RADIOLOGY REPORT*  Clinical Data: Fall, posterior neck pain  CERVICAL SPINE - COMPLETE 4+ VIEW  Comparison: None.  Findings: C1 through the cervical thoracic junction is visualized in its entirety.  Normal alignment. No precervical soft tissue widening is present.  Minimal anterior disc degenerative change at C5-C6 noted.  Neural foramina are widely patent.  No fracture or dislocation. The dens is intact and well situated between the lateral masses.   IMPRESSION: No acute finding.  Original Report Authenticated By: Harrel Lemon, M.D.   Dg Lumbar Spine Complete  01/19/2012  *RADIOLOGY REPORT*  Clinical Data: Fall, low back pain  LUMBAR SPINE - COMPLETE 4+ VIEW  Comparison: CT 04/18/2011  Findings: Normal alignment. Five non-rib bearing lumbar type vertebral bodies are identified.  Intervertebral disc spaces and vertebral body heights are preserved.  No fracture or dislocation.  IMPRESSION: Normal exam.  Original Report Authenticated By: Harrel Lemon, M.D.     1. Back pain     5:02 PM patient from stretcher triaged to CDU. Patient discussed with Dr. Alto Denver. Patient has a history of back pain. She was seen at [facility] yesterday and treated with pain medicine and discharged home. After discharge patient reports she had a fall this morning and now has neck and back pain. X-rays are pending. Patient has been ordered pain medication.  Patient informed of negative results. Patient given additional dose of pain medicine. She has had itching with this that was unresolved with Benadryl however she states some relief of her back pain. Patient has pain medication at home and is requesting discharge. Patient requested medication to help itching and sleep and I prescribed Atarax.  Patient was counseled on back pain precautions and told to do activity as tolerated but do  not lift, push, or pull heavy objects more than 10 pounds.  Patient counseled to use ice or heat on back for no longer than 15 minutes every hour.  Questions answered.  Patient verbalized understanding.  Urged followup with her pain management/rehabilitation physician.   MDM  Back pain after fall. X-rays negative. Patient has chronic back pain. No red flag signs and symptoms of lower back pain. Pain improved in emergency department. Discharged home in good condition.        Eustace Moore [place], [place] 01/19/12 2329

## 2012-01-19 NOTE — ED Provider Notes (Signed)
History     CSN: [id number]  Arrival date & time 01/19/12  1436   First MD Initiated Contact with Patient 01/19/12 1532      Chief Complaint  Patient presents with  . Fall  . Back Pain    (Consider location/radiation/quality/duration/timing/severity/associated sxs/prior treatment) HPI Patient is a 41 yo female with a history of chronic neck pain who presents with neck and back pain that occurred as the result of a mechanical fall when she slipped in a puddle of water in her kitchen.  Patient reports landing flat on her back with no LOC and no reported other injuries.  She has no new neurologic symptoms including incontinence or new focal weakness or paresthesias.  She indicates 10/10 pain in her lumbar and cervical spine as well as the adjacent musculature as well as in her groin region bilaterally.  She also endorses weakness in all of her extremities.  Of note, she indicates this in our conversation while moving both of her upper extremities extensively.  Patient was seen at 0200 today at [facility] for neck pain.  She was transported home by [facility] and then fell in her kitchen.  She then got a ride to be evaluated here.  Patient has her home dosage of dilaudid but reports that this was not helping and she was concerned something was really wrong.  She has no risk factors for pathologic fractures but is morbidly obese and reports hard fall.  Her pain is described as sharp.  There are no other associated or modifying factors. Past Medical History  Diagnosis Date  . Acid reflux   . Migraine   . Back pain, chronic     History reviewed. No pertinent past surgical history.  History reviewed. No pertinent family history.  History  Substance Use Topics  . Smoking status: Current Everyday Smoker  . Smokeless tobacco: Never Used  . Alcohol Use: No    OB History    Grav Para Term Preterm Abortions TAB SAB Ect Mult Living                  Review of Systems  Constitutional:  Negative.   HENT: Positive for neck pain.   Eyes: Negative.   Respiratory: Negative.   Cardiovascular: Negative.   Gastrointestinal: Negative.   Genitourinary: Positive for frequency.  Musculoskeletal: Positive for myalgias and back pain.  Skin: Negative.   Neurological: Negative.   Hematological: Negative.   Psychiatric/Behavioral: Negative.   All other systems reviewed and are negative.    Allergies  Tramadol; Naproxen; and Darvocet  Home Medications   Current Outpatient Rx  Name Route Sig Dispense Refill  . HYDROMORPHONE HCL 4 MG PO TABS Oral Take 4 mg by mouth every 4 (four) hours as needed.    Marland Kitchen OMEPRAZOLE 40 MG PO CPDR Oral Take 40 mg by mouth daily.        BP 115/79  Pulse 62  Temp(Src) 97.3 F (36.3 C) (Oral)  Resp 20  SpO2 97%  Physical Exam  Nursing note and vitals reviewed. Constitutional: She is oriented to person, place, and time. She appears well-developed and well-nourished. No distress.       Morbidly obese  HENT:  Head: Normocephalic and atraumatic.  Eyes: Conjunctivae and EOM are normal. Pupils are equal, round, and reactive to light.  Neck: Normal range of motion.       TTP throughout entire neck   Cardiovascular: Normal rate, regular rhythm, normal heart sounds and intact distal  pulses.  Exam reveals no gallop and no friction rub.   No murmur heard. Pulmonary/Chest: Effort normal and breath sounds normal. No respiratory distress. She has no wheezes. She has no rales.  Abdominal: Soft. Bowel sounds are normal. She exhibits no distension. There is no tenderness. There is no rebound and no guarding.  Musculoskeletal: She exhibits tenderness. She exhibits no edema.       TTP in lumbar spine and adjacent musculature  Neurological: She is alert and oriented to person, place, and time. No cranial nerve deficit. She exhibits normal muscle tone. Coordination normal.  Skin: Skin is warm and dry. No rash noted.  Psychiatric: She has a normal mood and  affect.    ED Course  Procedures (including critical care time)  Labs Reviewed  URINALYSIS, ROUTINE W REFLEX MICROSCOPIC - Abnormal; Notable for the following:    Hgb urine dipstick MODERATE (*)    Leukocytes, UA SMALL (*)    All other components within normal limits  URINE MICROSCOPIC-ADD ON   Dg Cervical Spine Complete  01/19/2012  *RADIOLOGY REPORT*  Clinical Data: Fall, posterior neck pain  CERVICAL SPINE - COMPLETE 4+ VIEW  Comparison: None.  Findings: C1 through the cervical thoracic junction is visualized in its entirety.  Normal alignment. No precervical soft tissue widening is present.  Minimal anterior disc degenerative change at C5-C6 noted.  Neural foramina are widely patent.  No fracture or dislocation. The dens is intact and well situated between the lateral masses.  IMPRESSION: No acute finding.  Original Report Authenticated By: Harrel Lemon, M.D.   Dg Lumbar Spine Complete  01/19/2012  *RADIOLOGY REPORT*  Clinical Data: Fall, low back pain  LUMBAR SPINE - COMPLETE 4+ VIEW  Comparison: CT 04/18/2011  Findings: Normal alignment. Five non-rib bearing lumbar type vertebral bodies are identified.  Intervertebral disc spaces and vertebral body heights are preserved.  No fracture or dislocation.  IMPRESSION: Normal exam.  Original Report Authenticated By: Harrel Lemon, M.D.     1. Back pain   2. UTI (urinary tract infection)       MDM  Patient was in no acute distress but complained of acute injury from mechanical fall following previous ER visit.  There were no red flag symptoms but patient could not control her symptoms with home meds.  She also noted urinary frequency.  Patient had plain films of c-spine and l-spine that were negative.  Urinalysis was ordered from stretcher triage.  Patient was written for IVF as well as IV dilaudid and benadryl as she has resolved previously with 2 doses of this with exacerbations.  Patient was transferred to CDU for back pain  protocol.  UA returned with 11-20 WBC and hematuria.  Patient was cared for in CDU by PA and dispositioned per his note.  Review of this chart during completion of documentation revealed a possible early UTI that was not treated in ED.  Rx for keflex eprescribed to patient pharmacy.  I spoke with patient personally over the phone to notify her of this.         Cyndra Numbers, MD 01/20/12 [phone]

## 2012-01-19 NOTE — ED Notes (Signed)
Pt meal ordered from [facility]. Pt requested grilled chicken with salad and ice cream.

## 2012-01-19 NOTE — ED Notes (Signed)
[facility] arrived, pt placed on the stretcher, report and reason for transfer filled and given to the [facility], explained to pt that this ambulance transportation might not be covered by her insurance. Pt verbalized understanding.

## 2012-01-19 NOTE — ED Notes (Signed)
Received report from Janet, RN.
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
## 2012-01-19 NOTE — ED Notes (Signed)
Received pt after repot pt sitting up in bed watching tv and talking on the telephone. Pt in no acute distress. After being in pts room for aproximately 7 mins pt states she has uncontrolled itching. No rash noted. Pt requesting additional pain meds. Will notify provider.

## 2012-01-20 MED ORDER — CEPHALEXIN 500 MG PO CAPS: 500.0000 mg | ORAL_CAPSULE | Freq: Four times a day (QID) | ORAL | Status: AC

## 2012-01-20 NOTE — ED Provider Notes (Signed)
Medical screening examination/treatment/procedure(s) were performed by non-physician practitioner and as supervising physician I was immediately available for consultation/collaboration.  Cyndra Numbers, MD 01/20/12 [phone]

## 2012-01-23 ENCOUNTER — Encounter (HOSPITAL_COMMUNITY): Payer: Self-pay

## 2012-01-23 ENCOUNTER — Emergency Department (HOSPITAL_COMMUNITY)
Admission: EM | Admit: 2012-01-23 | Discharge: 2012-01-23 | Disposition: A | Discharge status: Home / Self Care | Payer: Medicaid Other | Attending: Emergency Medicine | Admitting: Emergency Medicine

## 2012-01-23 DIAGNOSIS — M545 Low back pain, unspecified: Secondary | ICD-10-CM | POA: Insufficient documentation

## 2012-01-23 DIAGNOSIS — Z79899 Other long term (current) drug therapy: Secondary | ICD-10-CM | POA: Insufficient documentation

## 2012-01-23 DIAGNOSIS — M79609 Pain in unspecified limb: Secondary | ICD-10-CM | POA: Insufficient documentation

## 2012-01-23 DIAGNOSIS — R21 Rash and other nonspecific skin eruption: Secondary | ICD-10-CM | POA: Insufficient documentation

## 2012-01-23 DIAGNOSIS — N390 Urinary tract infection, site not specified: Secondary | ICD-10-CM | POA: Insufficient documentation

## 2012-01-23 DIAGNOSIS — W19XXXA Unspecified fall, initial encounter: Secondary | ICD-10-CM | POA: Insufficient documentation

## 2012-01-23 DIAGNOSIS — M542 Cervicalgia: Secondary | ICD-10-CM | POA: Insufficient documentation

## 2012-01-23 DIAGNOSIS — B009 Herpesviral infection, unspecified: Secondary | ICD-10-CM | POA: Insufficient documentation

## 2012-01-23 DIAGNOSIS — G8929 Other chronic pain: Secondary | ICD-10-CM | POA: Insufficient documentation

## 2012-01-23 DIAGNOSIS — K219 Gastro-esophageal reflux disease without esophagitis: Secondary | ICD-10-CM | POA: Insufficient documentation

## 2012-01-23 LAB — URINALYSIS, ROUTINE W REFLEX MICROSCOPIC
Bilirubin Urine: NEGATIVE
Glucose, UA: NEGATIVE mg/dL
Ketones, ur: NEGATIVE mg/dL
Ketones, ur: NEGATIVE mg/dL
Nitrite: NEGATIVE
Nitrite: NEGATIVE
Protein, ur: NEGATIVE mg/dL
Protein, ur: NEGATIVE mg/dL
Specific Gravity, Urine: 1.028 (ref 1.005–1.030)
Urobilinogen, UA: 1 mg/dL (ref 0.0–1.0)
Urobilinogen, UA: 1 mg/dL (ref 0.0–1.0)
pH: 6 (ref 5.0–8.0)

## 2012-01-23 LAB — URINE MICROSCOPIC-ADD ON

## 2012-01-23 MED ORDER — METRONIDAZOLE 500 MG PO TABS
2000.0000 mg | ORAL_TABLET | Freq: Once | ORAL | Status: AC
  Administered 2012-01-23: 2000 mg via ORAL
  Filled 2012-01-23: qty 4

## 2012-01-23 MED ORDER — ONDANSETRON 4 MG PO TBDP
ORAL_TABLET | ORAL | Status: AC
  Administered 2012-01-23: 8 mg
  Filled 2012-01-23: qty 2

## 2012-01-23 MED ORDER — KETOROLAC TROMETHAMINE 60 MG/2ML IM SOLN
60.0000 mg | Freq: Once | INTRAMUSCULAR | Status: AC
  Administered 2012-01-23: 60 mg via INTRAMUSCULAR
  Filled 2012-01-23: qty 2

## 2012-01-23 MED ORDER — OXYCODONE-ACETAMINOPHEN 5-325 MG PO TABS
2.0000 | ORAL_TABLET | Freq: Once | ORAL | Status: AC
  Administered 2012-01-23: 2 via ORAL
  Filled 2012-01-23: qty 2

## 2012-01-23 MED ORDER — DIPHENHYDRAMINE HCL 50 MG/ML IJ SOLN
INTRAMUSCULAR | Status: AC
  Administered 2012-01-23: 25 mg
  Filled 2012-01-23: qty 1

## 2012-01-23 MED ORDER — HYDROMORPHONE HCL PF 2 MG/ML IJ SOLN
2.0000 mg | Freq: Once | INTRAMUSCULAR | Status: AC
  Administered 2012-01-23: 2 mg via INTRAMUSCULAR
  Filled 2012-01-23: qty 1

## 2012-01-23 MED ORDER — DIPHENHYDRAMINE HCL 25 MG PO CAPS
ORAL_CAPSULE | ORAL | Status: AC
  Administered 2012-01-23: 25 mg
  Filled 2012-01-23: qty 1

## 2012-01-23 NOTE — ED Provider Notes (Signed)
History     CSN: [id number]  Arrival date & time 01/23/12  4010   First MD Initiated Contact with Patient 01/23/12 [phone]      Chief Complaint  Patient presents with  . Back Pain    (Consider location/radiation/quality/duration/timing/severity/associated sxs/prior treatment) HPI Patient is a 41 yo F with chronic neck and lower back pain who was last seen in this ED on 1/20 by myself for exacerbation of her chronic pain following a mechanical fall that day.  She is here as she continues to have persistent pain.  This is despite having oral dilaudid available to her at home.  Patient denies new injury but endorses 10 out of 10 lower back pain as well as pain in her bilateral posterior thighs.  Patient denies incontinence or other neurologic symptoms.  Patient did have UTI at last presentation and was having increased urinary frequency at that time.  A prescription for keflex was called in by myself after the visit but the patient has never picked it up or taken it.  She is scheduled to follow-up with the orthopedist who follows her chronic neck and back issues in 2 weeks.  There are no other associated or modifying factors. Past Medical History  Diagnosis Date  . Acid reflux   . Migraine   . Back pain, chronic     History reviewed. No pertinent past surgical history.  History reviewed. No pertinent family history.  History  Substance Use Topics  . Smoking status: Current Everyday Smoker  . Smokeless tobacco: Never Used  . Alcohol Use: No    OB History    Grav Para Term Preterm Abortions TAB SAB Ect Mult Living                  Review of Systems  Constitutional: Negative.   HENT: Positive for neck pain.   Eyes: Negative.   Respiratory: Negative.   Cardiovascular: Negative.   Gastrointestinal: Negative.   Genitourinary: Negative.   Musculoskeletal: Positive for back pain.  Skin: Positive for rash.       Patient has cold sores over her upper lip  Neurological: Negative.     Hematological: Negative.   Psychiatric/Behavioral: Negative.   All other systems reviewed and are negative.    Allergies  Naproxen; Tramadol hcl; and Darvocet  Home Medications   Current Outpatient Rx  Name Route Sig Dispense Refill  . AMOXICILLIN 500 MG PO TABS Oral Take 500 mg by mouth 2 (two) times daily.    Marland Kitchen HYDROMORPHONE HCL 4 MG PO TABS Oral Take 4 mg by mouth every 4 (four) hours as needed. pain    . NAPROXEN SODIUM 220 MG PO TABS Oral Take 1,760-2,200 mg by mouth daily.    Marland Kitchen OMEPRAZOLE 40 MG PO CPDR Oral Take 40 mg by mouth daily.      . CEPHALEXIN 500 MG PO CAPS Oral Take 1 capsule (500 mg total) by mouth 4 (four) times daily. 40 capsule 0  . HYDROXYZINE HCL 50 MG PO TABS Oral Take 50 mg by mouth at bedtime as needed. itching      BP 108/81  Pulse 71  Temp(Src) 98.2 F (36.8 C) (Oral)  Resp 16  SpO2 95%  Physical Exam  Nursing note and vitals reviewed. Constitutional: She is oriented to person, place, and time. She appears well-developed and well-nourished.  HENT:  Head: Normocephalic and atraumatic.       Patient with 4 small vesicular lesions over the upper lip consistent with  the appearance of cold sores  Eyes: Conjunctivae and EOM are normal. Pupils are equal, round, and reactive to light.  Neck:       TTP over the paraspinal muscles   Cardiovascular: Normal rate, regular rhythm, normal heart sounds and intact distal pulses.  Exam reveals no gallop and no friction rub.   No murmur heard. Pulmonary/Chest: Effort normal and breath sounds normal. No respiratory distress. She has no wheezes. She has no rales.  Abdominal: Soft. Bowel sounds are normal. She exhibits no distension. There is no tenderness. There is no rebound and no guarding.  Musculoskeletal: She exhibits tenderness.       Bilateral lumbar back pain reproducible on palpation of paraspinal musculature.  Patient moves all 4 extremities symmetrically.  Patient has tenderness to palpation over the  hamstrings bilaterally  Neurological: She is alert and oriented to person, place, and time. No cranial nerve deficit. She exhibits normal muscle tone. Coordination normal.  Skin: Skin is warm and dry.  Psychiatric: She has a normal mood and affect.    ED Course  Procedures (including critical care time)  Labs Reviewed  URINALYSIS, ROUTINE W REFLEX MICROSCOPIC - Abnormal; Notable for the following:    APPearance CLOUDY (*)    Hgb urine dipstick SMALL (*)    Leukocytes, UA SMALL (*)    All other components within normal limits  URINE MICROSCOPIC-ADD ON - Abnormal; Notable for the following:    Squamous Epithelial / LPF MANY (*)    Crystals CA OXALATE CRYSTALS (*)    All other components within normal limits  URINALYSIS, ROUTINE W REFLEX MICROSCOPIC - Abnormal; Notable for the following:    Leukocytes, UA SMALL (*)    All other components within normal limits  URINE MICROSCOPIC-ADD ON - Abnormal; Notable for the following:    Squamous Epithelial / LPF FEW (*)    Bacteria, UA FEW (*)    All other components within normal limits   No results found.   1. Chronic back pain   2. UTI (urinary tract infection)       MDM  Patient was here again for treatment of her chronic neck and back issues.  As opposed to last visit patient denies acute injury.  She was neurologically intact without any symptoms or history to require repeat imaging.  Patient no longer has urinary symptoms but has also not had any of the antibiotic she was prescribed after last visit.  Patient had a UA to evaluate for continued presence of UTI.  Patient's initial micro showed trichomonas.  She absolutely denied any abdominal pain, vaginal discharge, or any concern for possible STD after being notified of result.  She requested repeat testing which was performed.  The second sample showed no evidence of trichomonas.  This was also not present on previous micro.  At last visit.  Patient declined pelvic exam but did want to  be treated for trich.  She was given flagyl 2 gm po and told to follow-up on this at her next OBGYN exam.  Patient symptoms were treated today with toradol IM, percocet po and 1 IM dose of dilaudid.  She was able to move her orthopedic follow-up to tomorrow.  Patient was discharged in good condition and can continue her home meds for pain control.        Cyndra Numbers, MD 01/23/12 2212

## 2012-01-23 NOTE — ED Notes (Signed)
Pt presents with complaints of chronic neck and back pain. She states that she was recently told she has uti but has not gotten her abx medication filled. She states that due to the pain and mobility issues she was unable to obtain the prescriptions. Pt states that she normally uses a cane and/or walker to move about. Pt is alert and oriented.
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
## 2012-01-23 NOTE — ED Notes (Signed)
Patient requesting to speak with edp again about rash in nose and under lip. md aware of request and that patient is also requesting additional pain meds. She states that the po and im meds have not helped her back/neck pain.

## 2012-01-23 NOTE — ED Notes (Signed)
Pt also c/o "sores" in bilateral nares, and upper lip since early this morning.

## 2012-03-04 ENCOUNTER — Emergency Department (HOSPITAL_COMMUNITY): Payer: Medicaid Other

## 2012-03-04 ENCOUNTER — Encounter (HOSPITAL_COMMUNITY): Payer: Self-pay | Admitting: *Deleted

## 2012-03-04 ENCOUNTER — Emergency Department (HOSPITAL_COMMUNITY)
Admission: EM | Admit: 2012-03-04 | Discharge: 2012-03-04 | Disposition: A | Discharge status: Home Health | Payer: Medicaid Other | Attending: Emergency Medicine | Admitting: Emergency Medicine

## 2012-03-04 DIAGNOSIS — R0602 Shortness of breath: Secondary | ICD-10-CM | POA: Insufficient documentation

## 2012-03-04 DIAGNOSIS — K219 Gastro-esophageal reflux disease without esophagitis: Secondary | ICD-10-CM | POA: Insufficient documentation

## 2012-03-04 DIAGNOSIS — R079 Chest pain, unspecified: Secondary | ICD-10-CM | POA: Insufficient documentation

## 2012-03-04 DIAGNOSIS — G8929 Other chronic pain: Secondary | ICD-10-CM | POA: Insufficient documentation

## 2012-03-04 DIAGNOSIS — J40 Bronchitis, not specified as acute or chronic: Secondary | ICD-10-CM | POA: Insufficient documentation

## 2012-03-04 DIAGNOSIS — R05 Cough: Secondary | ICD-10-CM | POA: Insufficient documentation

## 2012-03-04 DIAGNOSIS — J029 Acute pharyngitis, unspecified: Secondary | ICD-10-CM | POA: Insufficient documentation

## 2012-03-04 DIAGNOSIS — R197 Diarrhea, unspecified: Secondary | ICD-10-CM | POA: Insufficient documentation

## 2012-03-04 DIAGNOSIS — R059 Cough, unspecified: Secondary | ICD-10-CM | POA: Insufficient documentation

## 2012-03-04 DIAGNOSIS — J3489 Other specified disorders of nose and nasal sinuses: Secondary | ICD-10-CM | POA: Insufficient documentation

## 2012-03-04 DIAGNOSIS — R509 Fever, unspecified: Secondary | ICD-10-CM | POA: Insufficient documentation

## 2012-03-04 DIAGNOSIS — R6889 Other general symptoms and signs: Secondary | ICD-10-CM | POA: Insufficient documentation

## 2012-03-04 DIAGNOSIS — M542 Cervicalgia: Secondary | ICD-10-CM | POA: Insufficient documentation

## 2012-03-04 DIAGNOSIS — M549 Dorsalgia, unspecified: Secondary | ICD-10-CM | POA: Insufficient documentation

## 2012-03-04 DIAGNOSIS — F17200 Nicotine dependence, unspecified, uncomplicated: Secondary | ICD-10-CM | POA: Insufficient documentation

## 2012-03-04 HISTORY — DX: Other chronic pain: G89.29

## 2012-03-04 HISTORY — DX: Cervicalgia: M54.2

## 2012-03-04 LAB — DIFFERENTIAL
Lymphocytes Relative: 25 % (ref 12–46)
Lymphs Abs: 2.3 10*3/uL (ref 0.7–4.0)
Monocytes Relative: 8 % (ref 3–12)
Neutro Abs: 6 10*3/uL (ref 1.7–7.7)
Neutrophils Relative %: 66 % (ref 43–77)

## 2012-03-04 LAB — CBC
HCT: 40.9 % (ref 36.0–46.0)
Hemoglobin: 14 g/dL (ref 12.0–15.0)
MCH: 30.8 pg (ref 26.0–34.0)
MCV: 89.9 fL (ref 78.0–100.0)
RBC: 4.55 MIL/uL (ref 3.87–5.11)

## 2012-03-04 MED ORDER — BENZONATATE 100 MG PO CAPS: 100.0000 mg | ORAL_CAPSULE | Freq: Three times a day (TID) | ORAL | Status: AC

## 2012-03-04 NOTE — ED Provider Notes (Signed)
History     CSN: [id number]  Arrival date & time 03/04/12  0818   First MD Initiated Contact with Patient 03/04/12 [phone]      Chief Complaint  Patient presents with  . Cough, running nose and chest pain for 10 days      (Consider location/radiation/quality/duration/timing/severity/associated sxs/prior treatment) HPI  [name] with PMH of GERD, migraine, chronic back pain, and neck pain, who presents with cough, running nose, sore throat and chest pain. Per patient, her symptoms started 10 days ago. At the beginning, she has URI symptoms with coughing, sneezing, congestion, sore throat. She also had several days of watery diarrhea which resolved now. It is associated with fever, chills and mild SOB. She coughs up teaspoonful yellow-colored sputum without blood in it, which changed to green colored sputum. Patient also reports having chest pain which started on last Friday. The chest pain is located at her frontal chest, pleuritic in nature, worse with deep breath and coughing, but not with exertion. She denies pain or swelling in her calf area.  Patient states that her cough is worsening her pre-existing neck pain.  Denies abdominal pain,diarrhea, constipation, dysuria, urgency, frequency, hematuria or leg swelling.   Past Medical History  Diagnosis Date  . Acid reflux   . Migraine   . Back pain, chronic   . Neck pain, chronic     History reviewed. No pertinent past surgical history.  No family history on file.  History  Substance Use Topics  . Smoking status: Current Everyday Smoker  . Smokeless tobacco: Never Used  . Alcohol Use: No    OB History    Grav Para Term Preterm Abortions TAB SAB Ect Mult Living                  Review of Systems  Constitutional: Positive for fever and chills.  HENT: Positive for congestion, sore throat, rhinorrhea, neck pain, postnasal drip and sinus pressure. Negative for hearing loss, ear pain, nosebleeds, facial swelling,  trouble swallowing, neck stiffness, voice change and ear discharge.   Eyes: Negative for photophobia, pain, discharge and visual disturbance.  Respiratory: Positive for cough and shortness of breath. Negative for apnea, choking, chest tightness, wheezing and stridor.   Cardiovascular: Positive for chest pain. Negative for palpitations and leg swelling.  Gastrointestinal: Negative for nausea, vomiting, abdominal pain, diarrhea, constipation, blood in stool and abdominal distention.  Genitourinary: Negative for dysuria, urgency, frequency, hematuria and difficulty urinating.  Musculoskeletal: Positive for back pain. Negative for myalgias, joint swelling and gait problem.  Skin: Negative for color change and wound.  Neurological: Negative for dizziness, tremors, seizures, syncope, facial asymmetry, weakness and numbness.  Hematological: Negative for adenopathy.  Psychiatric/Behavioral: Negative for behavioral problems and agitation.     Allergies  Naproxen; Tramadol hcl; and Darvocet  Home Medications   Current Outpatient Rx  Name Route Sig Dispense Refill  . OMEPRAZOLE 40 MG PO CPDR Oral Take 40 mg by mouth daily.      . OXYCODONE-ACETAMINOPHEN 10-325 MG PO TABS Oral Take 1 tablet by mouth every 4 (four) hours as needed. For pain      BP 113/75  Pulse 64  Temp(Src) 97.9 F (36.6 C) (Oral)  Resp 16  SpO2 97%  Physical Exam  General: resting in bed, not in acute distress, speaks in full sentences. HEENT: PERRL, EOMI, no scleral icterus. No lymphadenopathy, mild right tonsillar enlargement without exudate. Cardiac: S1/S2, RRR, No murmurs, gallops or rubs Pulm: Good  air movement bilaterally, Clear to auscultation bilaterally, No rales, wheezing, rhonchi or rubs. Chest wall: there is tenderness upon palpation over her chest wall anteriorly.  Abd: Soft,  nondistended, nontender, no rebound pain, no organomegaly, BS present Ext: No rashes or edema, 2+DP/PT pulse bilaterally. No leg  swelling or redness in calf area Neuro: alert and oriented X3, cranial nerves II-XII grossly intact, muscle strength 5/5 in all extremeties,  sensation to light touch intact.    ED Course  Procedures (including critical care time)  CXR Rapid strep screen CBC with diff    Labs Reviewed  RAPID STREP SCREEN   No results found.   No diagnosis found.    MDM          Lorretta Harp, MD 03/04/12 1001  Lorretta Harp, MD 03/04/12 1003

## 2012-03-04 NOTE — ED Notes (Signed)
Pt has had URI symptoms with coughing, sneezing, congestion, sore throat and neck and chest pain (sneezing is worsening pre-existing neck injury).  Pt states that "my coughing has made my chest sore".  Pt has had fatigue, generalized weakness and body aches for a week.  Pt has had fevers and chill

## 2012-03-04 NOTE — ED Provider Notes (Signed)
Patient with uri symptoms complaining of chronic neck pain exacerbated with sneezing and cough. Patient states anterior chest pain for five days with complete reproducibility with palpation.    Hilario Quarry, MD 03/04/12 [phone]

## 2012-03-04 NOTE — ED Notes (Signed)
MD at bedside. 

## 2012-03-04 NOTE — Discharge Instructions (Signed)
Bronchitis Bronchitis is a problem of the air tubes leading to your lungs. This problem makes it hard for air to get in and out of the lungs. You may cough a lot because your air tubes are narrow. Going without care can cause lasting (chronic) bronchitis. HOME CARE   Drink enough fluids to keep your pee (urine) clear or pale yellow.   Use a cool mist humidifier.   Quit smoking if you smoke. If you keep smoking, the bronchitis might not get better.   Only take medicine as told by your doctor.  GET HELP RIGHT AWAY IF:   Coughing keeps you awake.   You start to wheeze.   You become more sick or weak.   You have a hard time breathing or get short of breath.   You cough up blood.   Coughing lasts more than 2 weeks.   You have a fever.   Your baby is older than 3 months with a rectal temperature of 102 F (38.9 C) or higher.   Your baby is 23 months old or younger with a rectal temperature of 100.4 F (38 C) or higher.  MAKE SURE YOU:  Understand these instructions.   Will watch your condition.   Will get help right away if you are not doing well or get worse.  Document Released: 06/03/2008 Document Revised: 12/05/2011 Document Reviewed: 11/17/2009 [facility] Patient Information 2012 [place], [place].Antibiotic Nonuse  Your caregiver felt that the infection or problem was not one that would be helped with an antibiotic. Infections may be caused by viruses or bacteria. Only a caregiver can tell which one of these is the likely cause of an illness. A cold is the most common cause of infection in both adults and children. A cold is a virus. Antibiotic treatment will have no effect on a viral infection. Viruses can lead to many lost days of work caring for sick children and many missed days of school. Children may catch as many as 10 "colds" or "flus" per year during which they can be tearful, cranky, and uncomfortable. The goal of treating a virus is aimed at keeping the ill person  comfortable. Antibiotics are medications used to help the body fight bacterial infections. There are relatively few types of bacteria that cause infections but there are hundreds of viruses. While both viruses and bacteria cause infection they are very different types of germs. A viral infection will typically go away by itself within 7 to 10 days. Bacterial infections may spread or get worse without antibiotic treatment. Examples of bacterial infections are:  Sore throats (like strep throat or tonsillitis).   Infection in the lung (pneumonia).   Ear and skin infections.  Examples of viral infections are:  Colds or flus.   Most coughs and bronchitis.   Sore throats not caused by Strep.   Runny noses.  It is often best not to take an antibiotic when a viral infection is the cause of the problem. Antibiotics can kill off the helpful bacteria that we have inside our body and allow harmful bacteria to start growing. Antibiotics can cause side effects such as allergies, nausea, and diarrhea without helping to improve the symptoms of the viral infection. Additionally, repeated uses of antibiotics can cause bacteria inside of our body to become resistant. That resistance can be passed onto harmful bacterial. The next time you have an infection it may be harder to treat if antibiotics are used when they are not needed. Not treating with antibiotics  allows our own immune system to develop and take care of infections more efficiently. Also, antibiotics will work better for [place] when they are prescribed for bacterial infections. Treatments for a child that is ill may include:  Give extra fluids throughout the day to stay hydrated.   Get plenty of rest.   Only give your child over-the-counter or prescription medicines for pain, discomfort, or fever as directed by your caregiver.   The use of a cool mist humidifier may help stuffy noses.   Cold medications if suggested by your caregiver.  Your  caregiver may decide to start you on an antibiotic if:  The problem you were seen for today continues for a longer length of time than expected.   You develop a secondary bacterial infection.  SEEK MEDICAL CARE IF:  Fever lasts longer than 5 days.   Symptoms continue to get worse after 5 to 7 days or become severe.   Difficulty in breathing develops.   Signs of dehydration develop (poor drinking, rare urinating, dark colored urine).   Changes in behavior or worsening tiredness (listlessness or lethargy).  Document Released: 02/24/2002 Document Revised: 12/05/2011 Document Reviewed: 08/23/2009 [facility] Patient Information 2012 [place], [place].

## 2012-03-06 ENCOUNTER — Emergency Department (HOSPITAL_COMMUNITY)
Admission: EM | Admit: 2012-03-06 | Discharge: 2012-03-06 | Disposition: A | Discharge status: Home / Self Care | Payer: Medicaid Other | Attending: Emergency Medicine | Admitting: Emergency Medicine

## 2012-03-06 ENCOUNTER — Encounter (HOSPITAL_COMMUNITY): Payer: Self-pay | Admitting: Family Medicine

## 2012-03-06 DIAGNOSIS — F17200 Nicotine dependence, unspecified, uncomplicated: Secondary | ICD-10-CM | POA: Insufficient documentation

## 2012-03-06 DIAGNOSIS — R0982 Postnasal drip: Secondary | ICD-10-CM | POA: Insufficient documentation

## 2012-03-06 DIAGNOSIS — R0602 Shortness of breath: Secondary | ICD-10-CM | POA: Insufficient documentation

## 2012-03-06 DIAGNOSIS — K219 Gastro-esophageal reflux disease without esophagitis: Secondary | ICD-10-CM | POA: Insufficient documentation

## 2012-03-06 DIAGNOSIS — R05 Cough: Secondary | ICD-10-CM | POA: Insufficient documentation

## 2012-03-06 DIAGNOSIS — R070 Pain in throat: Secondary | ICD-10-CM | POA: Insufficient documentation

## 2012-03-06 DIAGNOSIS — J069 Acute upper respiratory infection, unspecified: Secondary | ICD-10-CM | POA: Insufficient documentation

## 2012-03-06 DIAGNOSIS — R059 Cough, unspecified: Secondary | ICD-10-CM | POA: Insufficient documentation

## 2012-03-06 DIAGNOSIS — J3489 Other specified disorders of nose and nasal sinuses: Secondary | ICD-10-CM | POA: Insufficient documentation

## 2012-03-06 DIAGNOSIS — R079 Chest pain, unspecified: Secondary | ICD-10-CM | POA: Insufficient documentation

## 2012-03-06 DIAGNOSIS — M549 Dorsalgia, unspecified: Secondary | ICD-10-CM | POA: Insufficient documentation

## 2012-03-06 DIAGNOSIS — M542 Cervicalgia: Secondary | ICD-10-CM | POA: Insufficient documentation

## 2012-03-06 MED ORDER — PSEUDOEPH-BROMPHEN-DM 30-2-10 MG/5ML PO SYRP: 2.5000 mL | ORAL_SOLUTION | Freq: Four times a day (QID) | ORAL | Status: AC | PRN

## 2012-03-06 MED ORDER — DOXYCYCLINE HYCLATE 50 MG PO CAPS: 100.0000 mg | ORAL_CAPSULE | Freq: Two times a day (BID) | ORAL | Status: AC

## 2012-03-06 NOTE — ED Notes (Signed)
MD at bedside talking with pt about discharge status

## 2012-03-06 NOTE — ED Notes (Signed)
Reports cough and congestion x2 weeks. Pt is A/O x4. Skin warm and dry. Respirations even and unlabored. NAD noted at this time.

## 2012-03-06 NOTE — ED Notes (Signed)
Pt refusing to take rx and discharge instructions; refusing vital signs; refusing help out with a wheelchair. Diane, Consulting civil engineer notified.

## 2012-03-06 NOTE — ED Notes (Signed)
MD at bedside. 

## 2012-03-06 NOTE — ED Provider Notes (Signed)
History     CSN: [id number]  Arrival date & time 03/06/12  4098   First MD Initiated Contact with Patient 03/06/12 [phone]      Chief Complaint  Patient presents with  . Cough  . Nasal Congestion    (Consider location/radiation/quality/duration/timing/severity/associated sxs/prior treatment) HPI  [name] with PMH of GERD, migraine, chronic back pain, and neck pain, who presents with cough, running nose, sore throat and chest pain. I saw this patient two days ago in [facility] ED because of similar presentation. Her symptoms started 12 days ago. At the beginning, she has URI symptoms with coughing, sneezing, congestion, sore throat.  It is associated with fever, chills and mild SOB. She coughs up teaspoonful yellow-colored sputum without blood in it, which changed to green colored sputum. Patient also reports having chest pain which started on 7 days ago. Her chest pain is located at her frontal chest, pleuritic in nature, worse with deep breath and coughing, but not with exertion.  She denies pain or swelling in her calf area.  At [facility] ED, she had negative CXR and rapid strep screen test, did not have leukocytosis.  She was discharged home on tessalon. Today, she comes back with worsening symptoms. She states that her cough medication did not help. Patient states that her cough is worsening pre-existing neck pain and asks for pain medications.    Past Medical History  Diagnosis Date  . Acid reflux   . Migraine   . Back pain, chronic   . Neck pain, chronic     History reviewed. No pertinent past surgical history.  History reviewed. No pertinent family history.  History  Substance Use Topics  . Smoking status: Current Everyday Smoker  . Smokeless tobacco: Never Used  . Alcohol Use: No    OB History    Grav Para Term Preterm Abortions TAB SAB Ect Mult Living                  Review of Systems  Constitutional: Positive for fever and chills.  HENT: Positive for  congestion, sore throat, rhinorrhea, neck pain, postnasal drip and sinus pressure. Negative for hearing loss, ear pain, nosebleeds, facial swelling, trouble swallowing, neck stiffness, voice change and ear discharge.   Eyes: Negative for photophobia, pain, discharge and visual disturbance.  Respiratory: Positive for cough and shortness of breath. Negative for apnea, choking, chest tightness, wheezing and stridor.   Cardiovascular: Positive for chest pain. Negative for palpitations and leg swelling.  Gastrointestinal: Negative for nausea, vomiting, abdominal pain, diarrhea, constipation, blood in stool and abdominal distention.  Genitourinary: Negative for dysuria, urgency, frequency, hematuria and difficulty urinating.  Musculoskeletal: Positive for back pain. Negative for myalgias, joint swelling and gait problem. She has neck pain posteriorly. Skin: Negative for color change and wound.  Neurological: Negative for dizziness, tremors, seizures, syncope, facial asymmetry, weakness and numbness.  Hematological: Negative for adenopathy.  Psychiatric/Behavioral: Negative for behavioral problems and agitation.    Allergies  Naproxen; Tramadol hcl; and Darvocet  Home Medications   Current Outpatient Rx  Name Route Sig Dispense Refill  . BENZONATATE 100 MG PO CAPS Oral Take 1 capsule (100 mg total) by mouth every 8 (eight) hours. 10 capsule 0  . OMEPRAZOLE 40 MG PO CPDR Oral Take 40 mg by mouth daily.      . OXYCODONE-ACETAMINOPHEN 10-325 MG PO TABS Oral Take 1 tablet by mouth every 4 (four) hours as needed. For pain      BP  120/83  Pulse 75  Temp(Src) 98 F (36.7 C) (Oral)  Resp 20  SpO2 97%  Physical Exam  General: resting in bed, not in acute distress, speaks in full sentences. HEENT: PERRL, EOMI, no scleral icterus. No lymphadenopathy, mild right tonsillar enlargement without exudate. Cardiac: S1/S2, RRR, No murmurs, gallops or rubs Pulm: Good air movement bilaterally, Clear to  auscultation bilaterally, No rales, wheezing, rhonchi or rubs. Chest wall: there is tenderness upon palpation over her chest wall anteriorly.   Abd: Soft,  nondistended, nontender, no rebound pain, no organomegaly, BS present Ext: No rashes or edema, 2+DP/PT pulse bilaterally. No leg swelling or redness in calf area Neuro: alert and oriented X3, cranial nerves II-XII grossly intact, muscle strength 5/5 in all extremeties,  sensation to light touch intact.    ED Course  Procedures (including critical care time)  Patient's symptoms are most likely caused by URI. She had negative CXR and rapid strep screen test, did not have leukocytosis two days ago. Her lung ausculation is clear. Her temperature is normal. Other vital signs are normal. Patient will be discharged home on doxycycline and cough med.   Labs Reviewed - No data to display Dg Chest 2 View  03/04/2012  *RADIOLOGY REPORT*  Clinical Data: Flu-like symptoms.  CHEST - 2 VIEW  Comparison: 06/03/2011  Findings: Slight peribronchial thickening. Heart and mediastinal contours are within normal limits.  No focal opacities or effusions.  No acute bony abnormality.  IMPRESSION: Slight bronchitic changes.  Original Report Authenticated By: Cyndie Chime, M.D.     No diagnosis found.    MDM          Lorretta Harp, MD 03/06/12 [phone]

## 2012-03-06 NOTE — ED Provider Notes (Signed)
I saw and evaluated the patient, reviewed the resident's note and I agree with the findings and plan.   .Face to face Exam:  General:  Awake HEENT:  Atraumatic Resp:  Normal effort Abd:  Nondistended Neuro:No focal weakness Lymph: No adenopathy   Nelia Shi, MD 03/06/12 [phone]

## 2012-03-16 DIAGNOSIS — K219 Gastro-esophageal reflux disease without esophagitis: Secondary | ICD-10-CM | POA: Insufficient documentation

## 2012-03-16 DIAGNOSIS — G8929 Other chronic pain: Secondary | ICD-10-CM | POA: Insufficient documentation

## 2012-03-26 ENCOUNTER — Emergency Department (HOSPITAL_COMMUNITY): Payer: Medicaid Other

## 2012-03-26 ENCOUNTER — Encounter (HOSPITAL_COMMUNITY): Payer: Self-pay

## 2012-03-26 ENCOUNTER — Emergency Department (HOSPITAL_COMMUNITY)
Admission: EM | Admit: 2012-03-26 | Discharge: 2012-03-26 | Disposition: A | Discharge status: Home / Self Care | Payer: Medicaid Other | Attending: Emergency Medicine | Admitting: Emergency Medicine

## 2012-03-26 DIAGNOSIS — S20219A Contusion of unspecified front wall of thorax, initial encounter: Secondary | ICD-10-CM

## 2012-03-26 DIAGNOSIS — M542 Cervicalgia: Secondary | ICD-10-CM | POA: Insufficient documentation

## 2012-03-26 DIAGNOSIS — K219 Gastro-esophageal reflux disease without esophagitis: Secondary | ICD-10-CM | POA: Insufficient documentation

## 2012-03-26 DIAGNOSIS — M7989 Other specified soft tissue disorders: Secondary | ICD-10-CM | POA: Insufficient documentation

## 2012-03-26 DIAGNOSIS — M25579 Pain in unspecified ankle and joints of unspecified foot: Secondary | ICD-10-CM | POA: Insufficient documentation

## 2012-03-26 DIAGNOSIS — S139XXA Sprain of joints and ligaments of unspecified parts of neck, initial encounter: Secondary | ICD-10-CM | POA: Insufficient documentation

## 2012-03-26 DIAGNOSIS — M79609 Pain in unspecified limb: Secondary | ICD-10-CM | POA: Insufficient documentation

## 2012-03-26 DIAGNOSIS — W19XXXA Unspecified fall, initial encounter: Secondary | ICD-10-CM | POA: Insufficient documentation

## 2012-03-26 DIAGNOSIS — S93401A Sprain of unspecified ligament of right ankle, initial encounter: Secondary | ICD-10-CM

## 2012-03-26 DIAGNOSIS — S93409A Sprain of unspecified ligament of unspecified ankle, initial encounter: Secondary | ICD-10-CM | POA: Insufficient documentation

## 2012-03-26 DIAGNOSIS — F17200 Nicotine dependence, unspecified, uncomplicated: Secondary | ICD-10-CM | POA: Insufficient documentation

## 2012-03-26 DIAGNOSIS — S161XXA Strain of muscle, fascia and tendon at neck level, initial encounter: Secondary | ICD-10-CM

## 2012-03-26 MED ORDER — OXYCODONE-ACETAMINOPHEN 5-325 MG PO TABS: 2.0000 | ORAL_TABLET | ORAL | Status: DC | PRN

## 2012-03-26 MED ORDER — OXYCODONE-ACETAMINOPHEN 5-325 MG PO TABS: 1.0000 | ORAL_TABLET | Freq: Once | ORAL | Status: DC

## 2012-03-26 MED ORDER — OXYCODONE-ACETAMINOPHEN 5-325 MG PO TABS
1.0000 | ORAL_TABLET | Freq: Once | ORAL | Status: AC
  Administered 2012-03-26: 1 via ORAL
  Filled 2012-03-26: qty 1

## 2012-03-26 MED ORDER — DIAZEPAM 5 MG PO TABS: 5.0000 mg | ORAL_TABLET | Freq: Three times a day (TID) | ORAL | Status: AC | PRN

## 2012-03-26 MED ORDER — MORPHINE SULFATE 4 MG/ML IJ SOLN
6.0000 mg | Freq: Once | INTRAMUSCULAR | Status: AC
  Administered 2012-03-26: 6 mg via INTRAMUSCULAR
  Filled 2012-03-26: qty 2

## 2012-03-26 MED ORDER — OXYCODONE-ACETAMINOPHEN 5-325 MG PO TABS
ORAL_TABLET | ORAL | Status: AC
  Filled 2012-03-26: qty 1

## 2012-03-26 NOTE — ED Notes (Signed)
C-,collar was discontinued by C. Lawyer PAC

## 2012-03-26 NOTE — ED Notes (Signed)
Pt received to [facility] with c/o neck,chest  And rt ankle pain onset yesterday after she fell. Swelling noted to the rt ankle. Pt is NAD

## 2012-03-26 NOTE — Discharge Instructions (Signed)
Return here as needed. Ice and heat to your neck and ankle. Call [facility] or [facility] to obtain an Primary Care Doctor.
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
## 2012-03-26 NOTE — ED Provider Notes (Signed)
Medical screening examination/treatment/procedure(s) were performed by non-physician practitioner and as supervising physician I was immediately available for consultation/collaboration.  Geoffery Lyons, MD 03/26/12 [phone]

## 2012-03-26 NOTE — ED Notes (Signed)
Pt claimed that 1 tablet of Percocet will not do anything for her pain. Clover Mealy PAC was made aware.

## 2012-03-26 NOTE — ED Notes (Signed)
C-collar applied

## 2012-03-26 NOTE — ED Notes (Signed)
Pt presented to the ER, S/P fall yesterday with neck pain, chest pain and rt ankle pain. Pt denies any LOC after the fall.

## 2012-03-26 NOTE — Progress Notes (Signed)
Orthopedic Tech Progress Note Patient Details:  Victoria Collier 08/29/1971 [id number]  Other Ortho Devices Type of Ortho Device: Crutches Ortho Device Location: air cast  Ortho Device Interventions: Application   Cammer, Mickie Bail 03/26/2012, 11:21 AM

## 2012-04-11 ENCOUNTER — Encounter (HOSPITAL_COMMUNITY): Payer: Self-pay

## 2012-04-11 ENCOUNTER — Emergency Department (HOSPITAL_COMMUNITY): Payer: Medicaid Other

## 2012-04-11 ENCOUNTER — Emergency Department (HOSPITAL_COMMUNITY)
Admission: EM | Admit: 2012-04-11 | Discharge: 2012-04-11 | Disposition: A | Discharge status: Home / Self Care | Payer: Medicaid Other | Attending: Emergency Medicine | Admitting: Emergency Medicine

## 2012-04-11 DIAGNOSIS — G8929 Other chronic pain: Secondary | ICD-10-CM

## 2012-04-11 DIAGNOSIS — R079 Chest pain, unspecified: Secondary | ICD-10-CM | POA: Insufficient documentation

## 2012-04-11 DIAGNOSIS — S93409A Sprain of unspecified ligament of unspecified ankle, initial encounter: Secondary | ICD-10-CM | POA: Insufficient documentation

## 2012-04-11 DIAGNOSIS — R109 Unspecified abdominal pain: Secondary | ICD-10-CM | POA: Insufficient documentation

## 2012-04-11 DIAGNOSIS — S139XXA Sprain of joints and ligaments of unspecified parts of neck, initial encounter: Secondary | ICD-10-CM

## 2012-04-11 DIAGNOSIS — W19XXXA Unspecified fall, initial encounter: Secondary | ICD-10-CM | POA: Insufficient documentation

## 2012-04-11 MED ORDER — OXYCODONE-ACETAMINOPHEN 5-325 MG PO TABS
2.0000 | ORAL_TABLET | Freq: Once | ORAL | Status: AC
  Administered 2012-04-11: 2 via ORAL
  Filled 2012-04-11: qty 2

## 2012-04-11 MED ORDER — DIAZEPAM 5 MG PO TABS: 5.0000 mg | ORAL_TABLET | Freq: Four times a day (QID) | ORAL | Status: AC | PRN

## 2012-04-11 MED ORDER — DIAZEPAM 5 MG PO TABS
5.0000 mg | ORAL_TABLET | Freq: Once | ORAL | Status: AC
  Administered 2012-04-11: 5 mg via ORAL
  Filled 2012-04-11: qty 1

## 2012-04-11 MED ORDER — OXYCODONE-ACETAMINOPHEN 5-325 MG PO TABS: 2.0000 | ORAL_TABLET | ORAL | Status: AC | PRN

## 2012-04-11 NOTE — Progress Notes (Signed)
Orthopedic Tech Progress Note Patient Details:  Victoria Collier 03/24/1971 [id number]  Other Ortho Devices Type of Ortho Device: ASO Ortho Device Location: right ankle Ortho Device Interventions: Application   Capone Schwinn T 04/11/2012, 1:52 PM
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
## 2012-04-11 NOTE — Discharge Instructions (Signed)
Follow with the pain clinic  Ankle Sprain An ankle sprain is an injury to the strong, fibrous tissues (ligaments) that hold the bones of your ankle joint together.  CAUSES Ankle sprain usually is caused by a fall or by twisting your ankle. People who participate in sports are more prone to these types of injuries.  SYMPTOMS  Symptoms of ankle sprain include:  Pain in your ankle. The pain may be present at rest or only when you are trying to stand or walk.   Swelling.   Bruising. Bruising may develop immediately or within 1 to 2 days after your injury.   Difficulty standing or walking.  DIAGNOSIS  Your caregiver will ask you details about your injury and perform a physical exam of your ankle to determine if you have an ankle sprain. During the physical exam, your caregiver will press and squeeze specific areas of your foot and ankle. Your caregiver will try to move your ankle in certain ways. An X-ray exam may be done to be sure a bone was not broken or a ligament did not separate from one of the bones in your ankle (avulsion).  TREATMENT  Certain types of braces can help stabilize your ankle. Your caregiver can make a recommendation for this. Your caregiver may recommend the use of medication for pain. If your sprain is severe, your caregiver may refer you to a surgeon who helps to restore function to parts of your skeletal system (orthopedist) or a physical therapist. HOME CARE INSTRUCTIONS  Apply ice to your injury for 1 to 2 days or as directed by your caregiver. Applying ice helps to reduce inflammation and pain.  Put ice in a plastic bag.   Place a towel between your skin and the bag.   Leave the ice on for 15 to 20 minutes at a time, every 2 hours while you are awake.   Take over-the-counter or prescription medicines for pain, discomfort, or fever only as directed by your caregiver.   Keep your injured leg elevated, when possible, to lessen swelling.   If your caregiver  recommends crutches, use them as instructed. Gradually, put weight on the affected ankle. Continue to use crutches or a cane until you can walk without feeling pain in your ankle.   If you have a plaster splint, wear the splint as directed by your caregiver. Do not rest it on anything harder than a pillow the first 24 hours. Do not put weight on it. Do not get it wet. You may take it off to take a shower or bath.   You may have been given an elastic bandage to wear around your ankle to provide support. If the elastic bandage is too tight (you have numbness or tingling in your foot or your foot becomes cold and blue), adjust the bandage to make it comfortable.   If you have an air splint, you may blow more air into it or let air out to make it more comfortable. You may take your splint off at night and before taking a shower or bath.   Wiggle your toes in the splint several times per day if you are able.  SEEK MEDICAL CARE IF:   You have an increase in bruising, swelling, or pain.   Your toes feel cold.   Pain relief is not achieved with medication.  SEEK IMMEDIATE MEDICAL CARE IF: Your toes are numb or blue or you have severe pain. MAKE SURE YOU:   Understand these instructions.  Will watch your condition.   Will get help right away if you are not doing well or get worse.  Document Released: 12/16/2005 Document Revised: 12/05/2011 Document Reviewed: 07/20/2008 [facility] Patient Information 2012 [place], [place].Cervical Sprain A cervical sprain is an injury in the neck in which the ligaments are stretched or torn. The ligaments are the tissues that hold the bones of the neck (vertebrae) in place.Cervical sprains can range from very mild to very severe. Most cervical sprains get better in 1 to 3 weeks, but it depends on the cause and extent of the injury. Severe cervical sprains can cause the neck vertebrae to be unstable. This can lead to damage of the spinal cord and can result in serious  nervous system problems. Your caregiver will determine whether your cervical sprain is mild or severe. CAUSES  Severe cervical sprains may be caused by:  Contact sport injuries (football, rugby, wrestling, hockey, auto racing, gymnastics, diving, martial arts, boxing).   Motor vehicle collisions.   Whiplash injuries. This means the neck is forcefully whipped backward and forward.   Falls.  Mild cervical sprains may be caused by:   Awkward positions, such as cradling a telephone between your ear and shoulder.   Sitting in a chair that does not offer proper support.   Working at a poorly Marketing executive station.   Activities that require looking up or down for long periods of time.  SYMPTOMS   Pain, soreness, stiffness, or a burning sensation in the front, back, or sides of the neck. This discomfort may develop immediately after injury or it may develop slowly and not begin for 24 hours or more after an injury.   Pain or tenderness directly in the middle of the back of the neck.   Shoulder or upper back pain.   Limited ability to move the neck.   Headache.   Dizziness.   Weakness, numbness, or tingling in the hands or arms.   Muscle spasms.   Difficulty swallowing or chewing.   Tenderness and swelling of the neck.  DIAGNOSIS  Most of the time, your caregiver can diagnose this problem by taking your history and doing a physical exam. Your caregiver will ask about any known problems, such as arthritis in the neck or a previous neck injury. X-rays may be taken to find out if there are any other problems, such as problems with the bones of the neck. However, an X-ray often does not reveal the full extent of a cervical sprain. Other tests such as a computed tomography (CT) scan or magnetic resonance imaging (MRI) may be needed. TREATMENT  Treatment depends on the severity of the cervical sprain. Mild sprains can be treated with rest, keeping the neck in place (immobilization),  and pain medicines. Severe cervical sprains need immediate immobilization and an appointment with an orthopedist or neurosurgeon. Several treatment options are available to help with pain, muscle spasms, and other symptoms. Your caregiver may prescribe:  Medicines, such as pain relievers, numbing medicines, or muscle relaxants.   Physical therapy. This can include stretching exercises, strengthening exercises, and posture training. Exercises and improved posture can help stabilize the neck, strengthen muscles, and help stop symptoms from returning.   A neck collar to be worn for short periods of time. Often, these collars are worn for comfort. However, certain collars may be worn to protect the neck and prevent further worsening of a serious cervical sprain.  HOME CARE INSTRUCTIONS   Put ice on the injured area.  Put ice in a plastic bag.   Place a towel between your skin and the bag.   Leave the ice on for 15 to 20 minutes, 3 to 4 times a day.   Only take over-the-counter or prescription medicines for pain, discomfort, or fever as directed by your caregiver.   Keep all follow-up appointments as directed by your caregiver.   Keep all physical therapy appointments as directed by your caregiver.   If a neck collar is prescribed, wear it as directed by your caregiver.   Do not drive while wearing a neck collar.   Make any needed adjustments to your work station to promote good posture.   Avoid positions and activities that make your symptoms worse.   Warm up and stretch before being active to help prevent problems.  SEEK MEDICAL CARE IF:   Your pain is not controlled with medicine.   You are unable to decrease your pain medicine over time as planned.   Your activity level is not improving as expected.  SEEK IMMEDIATE MEDICAL CARE IF:   You develop any bleeding, stomach upset, or signs of an allergic reaction to your medicine.   Your symptoms get worse.   You develop new,  unexplained symptoms.   You have numbness, tingling, weakness, or paralysis in any part of your body.  MAKE SURE YOU:   Understand these instructions.   Will watch your condition.   Will get help right away if you are not doing well or get worse.  Document Released: 10/13/2007 Document Revised: 12/05/2011 Document Reviewed: 09/18/2011 [facility] Patient Information 2012 [place], [place].Chronic Pain Chronic pain can be defined as pain that is lasting, off and on, and lasts for 3 to 6 months or longer. Many things cause chronic pain, which can make it difficult to make a discrete diagnosis. There are many treatment options available for chronic pain. However, finding a treatment that works well for you may require trying various approaches until a suitable one is found. CAUSES  In some types of chronic medical conditions, the pain is caused by a normal pain response within the body. A normal pain response helps the body identify illness or injury and prevent further damage from being done. In these cases, the cause of the pain may be identified and treated, even if it may not be cured completely. Examples of chronic conditions which can cause chronic pain include:  Inflammation of the joints (arthritis).   Back pain or neck pain (including bulging or herniated disks).   Migraine headaches.   Cancer.  In some other types of chronic pain syndromes, the pain is caused by an abnormal pain response within the body. An abnormal pain response is present when there is no ongoing cause (or stimulus) for the pain, or when the cause of the pain is arising from the nerves or nervous system itself. Examples of conditions which can cause chronic pain due to an abnormal pain response include:  Fibromyalgia.   Reflex sympathetic dystrophy (RSD).   Neuropathy (when the nerves themselves are damaged, and may cause pain).  DIAGNOSIS  Your caregiver will help diagnose your condition over time. In many cases,  the initial focus will be on excluding conditions that could be causing the pain. Depending on your symptoms, your caregiver may order some tests to diagnose your condition. Some of these tests include:  Blood tests.   Computerized X-ray scans (CT scan).   Computerized magnetic scans (MRI).   X-rays.   Ultrasounds.  Nerve conduction studies.   Consultation with other physicians or specialists.  TREATMENT  There are many treatment options for people suffering from chronic pain. Finding a treatment that works well may take time.   You may be referred to a pain management specialist.   You may be put on medication to help with the pain. Unfortunately, some medications (such as opiate medications) may not be very effective in cases where chronic pain is due to abnormal pain responses. Finding the right medications can take some time.   Adjunctive therapies may be used to provide additional relief and improve a patient's quality of life. These therapies include:   Mindfulness meditation.   Acupuncture.   Biofeedback.   Cognitive-behavioral therapy.   In certain cases, surgical interventions may be attempted.  HOME CARE INSTRUCTIONS   Make sure you understand these instructions prior to discharge.   Ask any questions and share any further concerns you have with your caregiver prior to discharge.   Take all medications as directed by your caregiver.   Keep all follow-up appointments.  SEEK MEDICAL CARE IF:   Your pain gets worse.   You develop a new pain that was not present before.   You cannot tolerate any medications prescribed by your caregiver.   You develop new symptoms since your last visit with your caregiver.  SEEK IMMEDIATE MEDICAL CARE IF:   You develop muscular weakness.   You have decreased sensation or numbness.   You lose control of bowel or bladder function.   Your pain suddenly gets much worse.   You have an oral temperature above 102 F (38.9  C), not controlled by medication.   You develop shaking chills, confusion, chest pain, or shortness of breath.  Document Released: 09/07/2002 Document Revised: 12/05/2011 Document Reviewed: 12/14/2008 [facility] Patient Information 2012 [place], [place].

## 2012-04-11 NOTE — ED Notes (Signed)
Pt.fell last night in her kitchen onto her rt. Side. Having rt. Neck pain and rt. Ankle pain,  Pt. Stated that she fell onto her ankle.  No deformity noted. Mild swelling.

## 2012-05-01 ENCOUNTER — Emergency Department (HOSPITAL_COMMUNITY)
Admission: EM | Admit: 2012-05-01 | Discharge: 2012-05-01 | Disposition: A | Discharge status: Home / Self Care | Payer: Medicaid Other | Attending: Emergency Medicine | Admitting: Emergency Medicine

## 2012-05-01 ENCOUNTER — Emergency Department (HOSPITAL_COMMUNITY): Payer: Medicaid Other

## 2012-05-01 ENCOUNTER — Encounter (HOSPITAL_COMMUNITY): Payer: Self-pay | Admitting: Emergency Medicine

## 2012-05-01 DIAGNOSIS — K219 Gastro-esophageal reflux disease without esophagitis: Secondary | ICD-10-CM | POA: Insufficient documentation

## 2012-05-01 DIAGNOSIS — W010XXA Fall on same level from slipping, tripping and stumbling without subsequent striking against object, initial encounter: Secondary | ICD-10-CM | POA: Insufficient documentation

## 2012-05-01 DIAGNOSIS — S139XXA Sprain of joints and ligaments of unspecified parts of neck, initial encounter: Secondary | ICD-10-CM | POA: Insufficient documentation

## 2012-05-01 DIAGNOSIS — S161XXA Strain of muscle, fascia and tendon at neck level, initial encounter: Secondary | ICD-10-CM

## 2012-05-01 DIAGNOSIS — M542 Cervicalgia: Secondary | ICD-10-CM | POA: Insufficient documentation

## 2012-05-01 MED ORDER — KETOROLAC TROMETHAMINE 30 MG/ML IJ SOLN
30.0000 mg | Freq: Once | INTRAMUSCULAR | Status: AC
  Administered 2012-05-01: 30 mg via INTRAMUSCULAR
  Filled 2012-05-01: qty 1

## 2012-05-01 MED ORDER — HYDROCODONE-ACETAMINOPHEN 5-325 MG PO TABS
2.0000 | ORAL_TABLET | Freq: Once | ORAL | Status: AC
  Administered 2012-05-01: 2 via ORAL
  Filled 2012-05-01: qty 2

## 2012-05-01 MED ORDER — IBUPROFEN 800 MG PO TABS: 800.0000 mg | ORAL_TABLET | Freq: Three times a day (TID) | ORAL | Status: AC

## 2012-05-01 MED ORDER — ACETAMINOPHEN-CODEINE #3 300-30 MG PO TABS: 1.0000 | ORAL_TABLET | Freq: Four times a day (QID) | ORAL | Status: AC | PRN

## 2012-05-01 MED ORDER — DIAZEPAM 5 MG PO TABS
10.0000 mg | ORAL_TABLET | Freq: Once | ORAL | Status: AC
  Administered 2012-05-01: 10 mg via ORAL
  Filled 2012-05-01: qty 2

## 2012-05-01 MED ORDER — DIAZEPAM 5 MG PO TABS: ORAL_TABLET | ORAL | Status: AC

## 2012-05-01 NOTE — ED Notes (Signed)
Pt called for ride. They will [id number] and get her
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
## 2012-05-01 NOTE — ED Notes (Signed)
Pt c/o generalized neck pain after fall on Wednesday; pt is wearing soft collar that she had at home; pt denies other complaint

## 2012-05-01 NOTE — Discharge Instructions (Signed)
Following up with your primary care provider and/or Dr. Ethelene Hal or another chronic pain doctor in the near future is VERY important for ongoing management of your neck pain. Ice neck throughout the day to decrease inflammation and pain. Use ibuprofen as directed for pain and inflammation using tylenol #3 for breakthrough pain. Do not drive or operate machinery with tylenol #3 use. Return to ER for any emergent changing or worsening of symptoms.   Cervical Sprain A cervical sprain is an injury in the neck in which the ligaments are stretched or torn. The ligaments are the tissues that hold the bones of the neck (vertebrae) in place.Cervical sprains can range from very mild to very severe. Most cervical sprains get better in 1 to 3 weeks, but it depends on the cause and extent of the injury. Severe cervical sprains can cause the neck vertebrae to be unstable. This can lead to damage of the spinal cord and can result in serious nervous system problems. Your caregiver will determine whether your cervical sprain is mild or severe. CAUSES  Severe cervical sprains may be caused by:  Contact sport injuries (football, rugby, wrestling, hockey, auto racing, gymnastics, diving, martial arts, boxing).   Motor vehicle collisions.   Whiplash injuries. This means the neck is forcefully whipped backward and forward.   Falls.  Mild cervical sprains may be caused by:   Awkward positions, such as cradling a telephone between your ear and shoulder.   Sitting in a chair that does not offer proper support.   Working at a poorly Marketing executive station.   Activities that require looking up or down for long periods of time.  SYMPTOMS   Pain, soreness, stiffness, or a burning sensation in the front, back, or sides of the neck. This discomfort may develop immediately after injury or it may develop slowly and not begin for 24 hours or more after an injury.   Pain or tenderness directly in the middle of the back of  the neck.   Shoulder or upper back pain.   Limited ability to move the neck.   Headache.   Dizziness.   Weakness, numbness, or tingling in the hands or arms.   Muscle spasms.   Difficulty swallowing or chewing.   Tenderness and swelling of the neck.  DIAGNOSIS  Most of the time, your caregiver can diagnose this problem by taking your history and doing a physical exam. Your caregiver will ask about any known problems, such as arthritis in the neck or a previous neck injury. X-rays may be taken to find out if there are any other problems, such as problems with the bones of the neck. However, an X-ray often does not reveal the full extent of a cervical sprain. Other tests such as a computed tomography (CT) scan or magnetic resonance imaging (MRI) may be needed. TREATMENT  Treatment depends on the severity of the cervical sprain. Mild sprains can be treated with rest, keeping the neck in place (immobilization), and pain medicines. Severe cervical sprains need immediate immobilization and an appointment with an orthopedist or neurosurgeon. Several treatment options are available to help with pain, muscle spasms, and other symptoms. Your caregiver may prescribe:  Medicines, such as pain relievers, numbing medicines, or muscle relaxants.   Physical therapy. This can include stretching exercises, strengthening exercises, and posture training. Exercises and improved posture can help stabilize the neck, strengthen muscles, and help stop symptoms from returning.   A neck collar to be worn for short periods of time.  Often, these collars are worn for comfort. However, certain collars may be worn to protect the neck and prevent further worsening of a serious cervical sprain.  HOME CARE INSTRUCTIONS   Put ice on the injured area.   Put ice in a plastic bag.   Place a towel between your skin and the bag.   Leave the ice on for 15 to 20 minutes, 3 to 4 times a day.   Only take over-the-counter or  prescription medicines for pain, discomfort, or fever as directed by your caregiver.   Keep all follow-up appointments as directed by your caregiver.   Keep all physical therapy appointments as directed by your caregiver.   If a neck collar is prescribed, wear it as directed by your caregiver.   Do not drive while wearing a neck collar.   Make any needed adjustments to your work station to promote good posture.   Avoid positions and activities that make your symptoms worse.   Warm up and stretch before being active to help prevent problems.  SEEK MEDICAL CARE IF:   Your pain is not controlled with medicine.   You are unable to decrease your pain medicine over time as planned.   Your activity level is not improving as expected.  SEEK IMMEDIATE MEDICAL CARE IF:   You develop any bleeding, stomach upset, or signs of an allergic reaction to your medicine.   Your symptoms get worse.   You develop new, unexplained symptoms.   You have numbness, tingling, weakness, or paralysis in any part of your body.  MAKE SURE YOU:   Understand these instructions.   Will watch your condition.   Will get help right away if you are not doing well or get worse.  Document Released: 10/13/2007 Document Revised: 12/05/2011 Document Reviewed: 09/18/2011 [facility] Patient Information 2012 [place], [place].

## 2012-05-04 NOTE — ED Provider Notes (Signed)
Medical screening examination/treatment/procedure(s) were performed by non-physician practitioner and as supervising physician I was immediately available for consultation/collaboration.   Carleene Cooper III, MD 05/04/12 1131

## 2012-05-29 ENCOUNTER — Emergency Department (HOSPITAL_COMMUNITY)
Admission: EM | Admit: 2012-05-29 | Discharge: 2012-05-29 | Disposition: A | Discharge status: Home / Self Care | Payer: Medicaid Other | Attending: Emergency Medicine | Admitting: Emergency Medicine

## 2012-05-29 ENCOUNTER — Emergency Department (HOSPITAL_COMMUNITY): Payer: Medicaid Other

## 2012-05-29 ENCOUNTER — Encounter (HOSPITAL_COMMUNITY): Payer: Self-pay | Admitting: *Deleted

## 2012-05-29 DIAGNOSIS — R110 Nausea: Secondary | ICD-10-CM | POA: Insufficient documentation

## 2012-05-29 DIAGNOSIS — F17200 Nicotine dependence, unspecified, uncomplicated: Secondary | ICD-10-CM | POA: Insufficient documentation

## 2012-05-29 DIAGNOSIS — R059 Cough, unspecified: Secondary | ICD-10-CM | POA: Insufficient documentation

## 2012-05-29 DIAGNOSIS — Z79899 Other long term (current) drug therapy: Secondary | ICD-10-CM | POA: Insufficient documentation

## 2012-05-29 DIAGNOSIS — R079 Chest pain, unspecified: Secondary | ICD-10-CM | POA: Insufficient documentation

## 2012-05-29 DIAGNOSIS — J329 Chronic sinusitis, unspecified: Secondary | ICD-10-CM | POA: Insufficient documentation

## 2012-05-29 DIAGNOSIS — M25569 Pain in unspecified knee: Secondary | ICD-10-CM

## 2012-05-29 DIAGNOSIS — M25579 Pain in unspecified ankle and joints of unspecified foot: Secondary | ICD-10-CM | POA: Insufficient documentation

## 2012-05-29 DIAGNOSIS — R42 Dizziness and giddiness: Secondary | ICD-10-CM

## 2012-05-29 DIAGNOSIS — W19XXXA Unspecified fall, initial encounter: Secondary | ICD-10-CM | POA: Insufficient documentation

## 2012-05-29 DIAGNOSIS — K219 Gastro-esophageal reflux disease without esophagitis: Secondary | ICD-10-CM | POA: Insufficient documentation

## 2012-05-29 DIAGNOSIS — R05 Cough: Secondary | ICD-10-CM | POA: Insufficient documentation

## 2012-05-29 LAB — URINALYSIS, ROUTINE W REFLEX MICROSCOPIC
Glucose, UA: NEGATIVE mg/dL
Ketones, ur: NEGATIVE mg/dL
Protein, ur: NEGATIVE mg/dL
Urobilinogen, UA: 1 mg/dL (ref 0.0–1.0)

## 2012-05-29 LAB — POCT I-STAT, CHEM 8
BUN: 7 mg/dL (ref 6–23)
Calcium, Ion: 1.23 mmol/L (ref 1.12–1.32)
Chloride: 103 mEq/L (ref 96–112)
Creatinine, Ser: 1 mg/dL (ref 0.50–1.10)
Glucose, Bld: 75 mg/dL (ref 70–99)
TCO2: 26 mmol/L (ref 0–100)

## 2012-05-29 LAB — POCT PREGNANCY, URINE: Preg Test, Ur: NEGATIVE

## 2012-05-29 MED ORDER — IBUPROFEN 800 MG PO TABS
800.0000 mg | ORAL_TABLET | Freq: Once | ORAL | Status: AC
  Administered 2012-05-29: 800 mg via ORAL
  Filled 2012-05-29: qty 1

## 2012-05-29 NOTE — Discharge Instructions (Signed)
Please read and follow all provided instructions.  Your diagnoses today include:  1. Sinusitis   2. Lightheadedness   3. Ankle pain   4. Knee pain     Tests performed today include:  EKG - normal  Urine test - normal  Blood test - normal  X-rays of your ankles and knee that did not show any broken bones  Vital signs. See below for your results today.   Medications prescribed:   None   Home care instructions:  Follow any educational materials contained in this packet.  Follow-up instructions: Please follow-up with your primary care provider in the next 3 days for further evaluation of your symptoms. If you do not have a primary care doctor -- see below for referral information.   Return instructions:   Please return to the Emergency Department if you experience worsening symptoms.  Return with chest pain or if you pass out.   Please return if you have any other emergent concerns.  Additional Information:  Your vital signs today were: BP 124/82  Pulse 63  Temp(Src) 98.4 F (36.9 C) (Oral)  Resp 16  Ht 5' 5.5" (1.664 m)  Wt 234 lb (106.142 kg)  BMI 38.35 kg/m2  SpO2 98% If your blood pressure (BP) was elevated above 135/85 this visit, please have this repeated by your doctor within one month. -------------- No Primary Care Doctor Call [facility]  [phone] Other agencies that provide inexpensive medical care    [facility]  [phone]    [facility]  [phone]    [facility]  [phone]    [facility]  [phone]    [facility]  [phone]    [facility]  [phone] -------------- RESOURCE GUIDE:  Dental Problems  Patients with Medicaid: [facility] [phone]                                            [phone] [address] Phone:  [phone]                                                   Phone:  [phone]  If unable to pay or  uninsured, contact:  [facility] or [facility]. to become qualified for the adult dental clinic.  Chronic Pain Problems Contact [facility]  [phone] Patients need to be referred by their primary care doctor.  Insufficient Money for Medicine Contact [employer]:  call "211" or [facility] [phone].  Psychological Services [facility]  [phone] [facility]  [phone] [facility]   [phone] (emergency services [phone])  Substance Abuse Resources [facility]  [phone] [facility] [phone] [facility] [phone] Floyd[facility] Flock [phone] Residential & Outpatient Substance Abuse Program  [phone]  Abuse/Neglect [facility] Child Abuse Hotline [phone] [facility] Child Abuse Hotline [phone] (After Hours)  Emergency Shelter [facility] [phone]  Maternity Homes [facility] [phone] [facility] [phone]  [facility]  Resources  [facility] [place]     [employer]                          [facility]. [address] [place]                       [address]      [address]  Blondell Reveal Phone:  [phone]                                   Phone:  [phone]                 Phone:  [phone]  [facility] Phone:  [phone]  [facility] Child Abuse Hotline [phone] (After Hours)
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
## 2012-05-29 NOTE — ED Provider Notes (Signed)
Medical screening examination/treatment/procedure(s) were performed by non-physician practitioner and as supervising physician I was immediately available for consultation/collaboration.   Celene Kras, MD 05/29/12 [phone]

## 2012-05-29 NOTE — ED Notes (Signed)
MD at bedside. 

## 2012-05-29 NOTE — ED Notes (Signed)
PT REFUSED TO DRINK PO FLUIDS

## 2012-05-29 NOTE — ED Notes (Signed)
Pt refusing to drink fluids. Requesting pain meds.

## 2012-08-09 ENCOUNTER — Encounter (HOSPITAL_COMMUNITY): Payer: Self-pay | Admitting: *Deleted

## 2012-08-09 ENCOUNTER — Emergency Department (HOSPITAL_COMMUNITY)
Admission: EM | Admit: 2012-08-09 | Discharge: 2012-08-09 | Disposition: A | Discharge status: Home / Self Care | Payer: Medicaid Other | Attending: Emergency Medicine | Admitting: Emergency Medicine

## 2012-08-09 ENCOUNTER — Emergency Department (HOSPITAL_COMMUNITY): Payer: Medicaid Other

## 2012-08-09 DIAGNOSIS — F17200 Nicotine dependence, unspecified, uncomplicated: Secondary | ICD-10-CM | POA: Insufficient documentation

## 2012-08-09 DIAGNOSIS — G8929 Other chronic pain: Secondary | ICD-10-CM | POA: Insufficient documentation

## 2012-08-09 DIAGNOSIS — M542 Cervicalgia: Secondary | ICD-10-CM | POA: Insufficient documentation

## 2012-08-09 DIAGNOSIS — W19XXXA Unspecified fall, initial encounter: Secondary | ICD-10-CM | POA: Insufficient documentation

## 2012-08-09 DIAGNOSIS — M546 Pain in thoracic spine: Secondary | ICD-10-CM | POA: Insufficient documentation

## 2012-08-09 DIAGNOSIS — M25579 Pain in unspecified ankle and joints of unspecified foot: Secondary | ICD-10-CM | POA: Insufficient documentation

## 2012-08-09 DIAGNOSIS — Y9229 Other specified public building as the place of occurrence of the external cause: Secondary | ICD-10-CM | POA: Insufficient documentation

## 2012-08-09 DIAGNOSIS — S92009A Unspecified fracture of unspecified calcaneus, initial encounter for closed fracture: Secondary | ICD-10-CM | POA: Insufficient documentation

## 2012-08-09 MED ORDER — DIAZEPAM 5 MG PO TABS: 5.0000 mg | ORAL_TABLET | Freq: Every day | ORAL | Status: DC

## 2012-08-09 MED ORDER — DIAZEPAM 5 MG PO TABS
5.0000 mg | ORAL_TABLET | Freq: Once | ORAL | Status: AC
  Administered 2012-08-09: 5 mg via ORAL
  Filled 2012-08-09: qty 1

## 2012-08-09 NOTE — Progress Notes (Signed)
Orthopedic Tech Progress Note Patient Details:  Victoria Collier [birth year] [id number]  Ortho Devices Type of Ortho Device: Crutches;CAM walker Ortho Device/Splint Location: (R) LE Ortho Device/Splint Interventions: Application   Jennye Moccasin 08/09/2012, 5:51 PM

## 2012-08-09 NOTE — ED Notes (Signed)
Pt returned to room from radiology
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
## 2012-08-09 NOTE — ED Notes (Signed)
Pt did not wait in room for d/c instructions or to sign for them, states her ride was leaving. Pt given d/c instructions in hallway. Pt requests prescriptions for pain medication from MD.

## 2012-08-13 ENCOUNTER — Encounter (HOSPITAL_COMMUNITY): Payer: Self-pay | Admitting: Emergency Medicine

## 2012-08-13 ENCOUNTER — Emergency Department (HOSPITAL_COMMUNITY)
Admission: EM | Admit: 2012-08-13 | Discharge: 2012-08-13 | Disposition: A | Discharge status: Home / Self Care | Payer: Medicaid Other | Attending: Emergency Medicine | Admitting: Emergency Medicine

## 2012-08-13 DIAGNOSIS — F17200 Nicotine dependence, unspecified, uncomplicated: Secondary | ICD-10-CM | POA: Insufficient documentation

## 2012-08-13 DIAGNOSIS — M79671 Pain in right foot: Secondary | ICD-10-CM

## 2012-08-13 DIAGNOSIS — M79609 Pain in unspecified limb: Secondary | ICD-10-CM | POA: Insufficient documentation

## 2012-08-13 DIAGNOSIS — K219 Gastro-esophageal reflux disease without esophagitis: Secondary | ICD-10-CM | POA: Insufficient documentation

## 2012-08-13 MED ORDER — OXYCODONE-ACETAMINOPHEN 5-325 MG PO TABS
2.0000 | ORAL_TABLET | Freq: Once | ORAL | Status: AC
  Administered 2012-08-13: 2 via ORAL
  Filled 2012-08-13: qty 2

## 2012-08-13 MED ORDER — OXYCODONE-ACETAMINOPHEN 5-325 MG PO TABS: 1.0000 | ORAL_TABLET | ORAL | Status: AC | PRN

## 2012-08-13 MED ORDER — DIAZEPAM 5 MG PO TABS: 5.0000 mg | ORAL_TABLET | Freq: Three times a day (TID) | ORAL | Status: AC | PRN

## 2012-08-13 NOTE — ED Notes (Signed)
Pt was here Sunday and was dx with a broken heel pt has been unable to due to medicaid not paying pt here needed something for pain and burning sensation in the ankle
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
## 2012-08-13 NOTE — ED Notes (Signed)
Pt reports falling in church this past Sunday. Has been dx with fracture of ankle. Burning sensation and aching/numbness in toes.  Also reports shooting pain from knee down.  Problems with medicaid pcp and orthopedic md.  Pt out of pain medication and was told by social worker to come back to ED.  Pt alert oriented X4

## 2012-09-08 ENCOUNTER — Encounter (HOSPITAL_COMMUNITY): Payer: Self-pay

## 2012-09-08 ENCOUNTER — Emergency Department (HOSPITAL_COMMUNITY): Payer: Medicaid Other

## 2012-09-08 DIAGNOSIS — R071 Chest pain on breathing: Secondary | ICD-10-CM | POA: Insufficient documentation

## 2012-09-08 DIAGNOSIS — K219 Gastro-esophageal reflux disease without esophagitis: Secondary | ICD-10-CM | POA: Insufficient documentation

## 2012-09-08 DIAGNOSIS — F17200 Nicotine dependence, unspecified, uncomplicated: Secondary | ICD-10-CM | POA: Insufficient documentation

## 2012-09-08 DIAGNOSIS — G8929 Other chronic pain: Secondary | ICD-10-CM | POA: Insufficient documentation

## 2012-09-08 LAB — PRO B NATRIURETIC PEPTIDE: Pro B Natriuretic peptide (BNP): 20.5 pg/mL (ref 0–125)

## 2012-09-08 LAB — CBC
HCT: 41.7 % (ref 36.0–46.0)
Hemoglobin: 14.1 g/dL (ref 12.0–15.0)
MCH: 30.4 pg (ref 26.0–34.0)
MCHC: 33.8 g/dL (ref 30.0–36.0)
MCV: 89.9 fL (ref 78.0–100.0)
RBC: 4.64 MIL/uL (ref 3.87–5.11)

## 2012-09-08 LAB — BASIC METABOLIC PANEL
BUN: 9 mg/dL (ref 6–23)
CO2: 24 mEq/L (ref 19–32)
Calcium: 9.3 mg/dL (ref 8.4–10.5)
Creatinine, Ser: 0.91 mg/dL (ref 0.50–1.10)
GFR calc Af Amer: 88 mL/min — ABNORMAL LOW (ref >90)
Glucose, Bld: 110 mg/dL — ABNORMAL HIGH (ref 70–99)
Sodium: 138 mEq/L (ref 135–145)

## 2012-09-08 NOTE — ED Notes (Signed)
Old and new EKG given to Dr Glick 

## 2012-09-08 NOTE — ED Notes (Addendum)
Pt reports intermittent mid-sternum chest pain radiating to (R) side chest and through to back, bilateral arm weakness, feeling faint, SOB, and "bad case of dry mouth" x3 days. Pt reports chest is tender to touch and increase pain w/deep breath. Pt reports symptoms started after cutting and pulling tree branches Saturday. Pt states "I may have pulled some muscles." nad, EDK competed in triage

## 2012-09-09 ENCOUNTER — Emergency Department (HOSPITAL_COMMUNITY)
Admission: EM | Admit: 2012-09-09 | Discharge: 2012-09-09 | Disposition: A | Discharge status: Home / Self Care | Payer: Medicaid Other | Attending: Emergency Medicine | Admitting: Emergency Medicine

## 2012-09-09 DIAGNOSIS — R0789 Other chest pain: Secondary | ICD-10-CM

## 2012-09-09 MED ORDER — CYCLOBENZAPRINE HCL 10 MG PO TABS: 10.0000 mg | ORAL_TABLET | Freq: Two times a day (BID) | ORAL | Status: AC | PRN

## 2012-09-09 MED ORDER — KETOROLAC TROMETHAMINE 30 MG/ML IJ SOLN
INTRAMUSCULAR | Status: AC
  Filled 2012-09-09: qty 1

## 2012-09-09 NOTE — ED Notes (Signed)
See down time paper charting as well.  Patient continues to complain of pain,  States she thinks it is muscle spasms.  Patient requested not to get vicodin.  ermd has changed her rx to a muscle relaxant

## 2012-09-09 NOTE — ED Notes (Signed)
Patient verbalized understanding of discharge instructions.  Patient encouraged to return as needed
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
## 2012-09-30 ENCOUNTER — Emergency Department (HOSPITAL_COMMUNITY): Payer: Medicaid Other

## 2012-09-30 ENCOUNTER — Observation Stay (HOSPITAL_COMMUNITY)
Admission: EM | Admit: 2012-09-30 | Discharge: 2012-10-03 | Disposition: A | Discharge status: Home / Self Care | Payer: Medicaid Other | Attending: Internal Medicine | Admitting: Internal Medicine

## 2012-09-30 ENCOUNTER — Encounter (HOSPITAL_COMMUNITY): Payer: Self-pay | Admitting: Adult Health

## 2012-09-30 DIAGNOSIS — K648 Other hemorrhoids: Secondary | ICD-10-CM | POA: Insufficient documentation

## 2012-09-30 DIAGNOSIS — K922 Gastrointestinal hemorrhage, unspecified: Secondary | ICD-10-CM

## 2012-09-30 DIAGNOSIS — M542 Cervicalgia: Secondary | ICD-10-CM | POA: Insufficient documentation

## 2012-09-30 DIAGNOSIS — K644 Residual hemorrhoidal skin tags: Secondary | ICD-10-CM | POA: Insufficient documentation

## 2012-09-30 DIAGNOSIS — G8929 Other chronic pain: Secondary | ICD-10-CM | POA: Insufficient documentation

## 2012-09-30 DIAGNOSIS — Z720 Tobacco use: Secondary | ICD-10-CM | POA: Diagnosis present

## 2012-09-30 DIAGNOSIS — F17200 Nicotine dependence, unspecified, uncomplicated: Secondary | ICD-10-CM | POA: Insufficient documentation

## 2012-09-30 DIAGNOSIS — Z79899 Other long term (current) drug therapy: Secondary | ICD-10-CM | POA: Insufficient documentation

## 2012-09-30 DIAGNOSIS — M549 Dorsalgia, unspecified: Secondary | ICD-10-CM | POA: Insufficient documentation

## 2012-09-30 DIAGNOSIS — K625 Hemorrhage of anus and rectum: Principal | ICD-10-CM | POA: Diagnosis present

## 2012-09-30 DIAGNOSIS — R109 Unspecified abdominal pain: Secondary | ICD-10-CM | POA: Insufficient documentation

## 2012-09-30 DIAGNOSIS — K279 Peptic ulcer, site unspecified, unspecified as acute or chronic, without hemorrhage or perforation: Secondary | ICD-10-CM | POA: Diagnosis present

## 2012-09-30 HISTORY — DX: Unspecified asthma, uncomplicated: J45.909

## 2012-09-30 HISTORY — DX: Unspecified osteoarthritis, unspecified site: M19.90

## 2012-09-30 HISTORY — DX: Personal history of peptic ulcer disease: Z87.11

## 2012-09-30 HISTORY — DX: Gastrointestinal hemorrhage, unspecified: K92.2

## 2012-09-30 HISTORY — DX: Post-traumatic stress disorder, unspecified: F43.10

## 2012-09-30 HISTORY — DX: Personal history of other diseases of the digestive system: Z87.19

## 2012-09-30 LAB — SAMPLE TO BLOOD BANK

## 2012-09-30 LAB — COMPREHENSIVE METABOLIC PANEL
AST: 21 U/L (ref 0–37)
BUN: 10 mg/dL (ref 6–23)
CO2: 25 mEq/L (ref 19–32)
Calcium: 9.4 mg/dL (ref 8.4–10.5)
Chloride: 101 mEq/L (ref 96–112)
Creatinine, Ser: 0.9 mg/dL (ref 0.50–1.10)
GFR calc Af Amer: 89 mL/min — ABNORMAL LOW (ref >90)
GFR calc non Af Amer: 77 mL/min — ABNORMAL LOW (ref >90)
Glucose, Bld: 113 mg/dL — ABNORMAL HIGH (ref 70–99)
Total Bilirubin: 0.3 mg/dL (ref 0.3–1.2)

## 2012-09-30 LAB — DIFFERENTIAL
Basophils Absolute: 0 10*3/uL (ref 0.0–0.1)
Basophils Relative: 0 % (ref 0–1)
Lymphocytes Relative: 39 % (ref 12–46)
Monocytes Absolute: 0.7 10*3/uL (ref 0.1–1.0)
Neutro Abs: 3.8 10*3/uL (ref 1.7–7.7)
Neutrophils Relative %: 51 % (ref 43–77)

## 2012-09-30 LAB — CBC
HCT: 43.2 % (ref 36.0–46.0)
Hemoglobin: 14.6 g/dL (ref 12.0–15.0)
MCH: 30.5 pg (ref 26.0–34.0)
MCV: 90.2 fL (ref 78.0–100.0)
Platelets: 117 10*3/uL — ABNORMAL LOW (ref 150–400)
RBC: 4.79 MIL/uL (ref 3.87–5.11)
WBC: 6.5 10*3/uL (ref 4.0–10.5)

## 2012-09-30 LAB — PROTIME-INR: Prothrombin Time: 13.5 seconds (ref 11.6–15.2)

## 2012-09-30 MED ORDER — ONDANSETRON HCL 4 MG/2ML IJ SOLN
4.0000 mg | Freq: Once | INTRAMUSCULAR | Status: AC
  Administered 2012-09-30: 4 mg via INTRAVENOUS
  Filled 2012-09-30: qty 2

## 2012-09-30 MED ORDER — MORPHINE SULFATE 4 MG/ML IJ SOLN
4.0000 mg | Freq: Once | INTRAMUSCULAR | Status: AC
  Administered 2012-09-30: 4 mg via INTRAVENOUS
  Filled 2012-09-30: qty 1

## 2012-09-30 MED ORDER — IOHEXOL 300 MG/ML  SOLN
20.0000 mL | INTRAMUSCULAR | Status: AC
  Administered 2012-09-30 – 2012-10-01 (×2): 20 mL via ORAL

## 2012-09-30 MED ORDER — IOHEXOL 300 MG/ML  SOLN
100.0000 mL | Freq: Once | INTRAMUSCULAR | Status: AC | PRN
  Administered 2012-09-30: 100 mL via INTRAVENOUS

## 2012-09-30 NOTE — ED Notes (Signed)
Pt reports she is unable to drink the second cup of CT contrast.
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
## 2012-09-30 NOTE — ED Notes (Signed)
Explained the admitting process and the prep for a colonoscopy.

## 2012-09-30 NOTE — ED Notes (Addendum)
LAst night had BM that was bright red blood and filled commode has had 3 more BM today the same, noticed some small clots. C/o lower quadrant abdominal pain that is described as constant cramping. Denies use of coumadin, aggrenox, ibuprofen, alcohol or plavixx or ASA. BP 120/94, HR 85, mucous membranes pink.  LAst BM one hour ago, small amount of stool with bright red blood

## 2012-10-01 ENCOUNTER — Encounter (HOSPITAL_COMMUNITY): Payer: Self-pay | Admitting: General Practice

## 2012-10-01 DIAGNOSIS — K625 Hemorrhage of anus and rectum: Principal | ICD-10-CM

## 2012-10-01 DIAGNOSIS — K922 Gastrointestinal hemorrhage, unspecified: Secondary | ICD-10-CM

## 2012-10-01 DIAGNOSIS — F17200 Nicotine dependence, unspecified, uncomplicated: Secondary | ICD-10-CM

## 2012-10-01 DIAGNOSIS — K279 Peptic ulcer, site unspecified, unspecified as acute or chronic, without hemorrhage or perforation: Secondary | ICD-10-CM

## 2012-10-01 LAB — CBC
HCT: 40.2 % (ref 36.0–46.0)
HCT: 41.1 % (ref 36.0–46.0)
Hemoglobin: 13.8 g/dL (ref 12.0–15.0)
Hemoglobin: 14 g/dL (ref 12.0–15.0)
MCH: 30.9 pg (ref 26.0–34.0)
MCH: 30.8 pg (ref 26.0–34.0)
MCHC: 34.3 g/dL (ref 30.0–36.0)
MCHC: 34.1 g/dL (ref 30.0–36.0)
MCV: 89.9 fL (ref 78.0–100.0)
MCV: 90.3 fL (ref 78.0–100.0)
Platelets: 186 10*3/uL (ref 150–400)
Platelets: 196 10*3/uL (ref 150–400)
Platelets: 120 10*3/uL — ABNORMAL LOW (ref 150–400)
RBC: 4.47 MIL/uL (ref 3.87–5.11)
RBC: 4.55 MIL/uL (ref 3.87–5.11)
RBC: 4.59 MIL/uL (ref 3.87–5.11)
RDW: 12.9 % (ref 11.5–15.5)
RDW: 12.9 % (ref 11.5–15.5)
RDW: 12.9 % (ref 11.5–15.5)
WBC: 7.8 10*3/uL (ref 4.0–10.5)
WBC: 6.6 10*3/uL (ref 4.0–10.5)
WBC: 6.3 10*3/uL (ref 4.0–10.5)

## 2012-10-01 LAB — COMPREHENSIVE METABOLIC PANEL
ALT: 20 U/L (ref 0–35)
AST: 18 U/L (ref 0–37)
Albumin: 3.6 g/dL (ref 3.5–5.2)
Alkaline Phosphatase: 60 U/L (ref 39–117)
CO2: 25 mEq/L (ref 19–32)
Chloride: 105 mEq/L (ref 96–112)
Creatinine, Ser: 0.86 mg/dL (ref 0.50–1.10)
GFR calc non Af Amer: 82 mL/min — ABNORMAL LOW (ref >90)
Potassium: 3.7 mEq/L (ref 3.5–5.1)
Sodium: 138 mEq/L (ref 135–145)
Total Bilirubin: 0.4 mg/dL (ref 0.3–1.2)

## 2012-10-01 LAB — TYPE AND SCREEN: Antibody Screen: NEGATIVE

## 2012-10-01 LAB — ABO/RH: ABO/RH(D): O POS

## 2012-10-01 MED ORDER — SODIUM CHLORIDE 0.9 % IV SOLN
INTRAVENOUS | Status: DC
Start: 2012-10-01 — End: 2012-10-03
  Administered 2012-10-01: 02:00:00 via INTRAVENOUS
  Administered 2012-10-02: 1000 mL via INTRAVENOUS

## 2012-10-01 MED ORDER — MORPHINE SULFATE 2 MG/ML IJ SOLN
2.0000 mg | INTRAMUSCULAR | Status: DC | PRN
  Administered 2012-10-01: 2 mg via INTRAVENOUS

## 2012-10-01 MED ORDER — PROCHLORPERAZINE EDISYLATE 5 MG/ML IJ SOLN
10.0000 mg | Freq: Four times a day (QID) | INTRAMUSCULAR | Status: DC | PRN
  Filled 2012-10-01: qty 2

## 2012-10-01 MED ORDER — ONDANSETRON HCL 4 MG PO TABS: 4.0000 mg | ORAL_TABLET | Freq: Four times a day (QID) | ORAL | Status: DC | PRN

## 2012-10-01 MED ORDER — ALBUTEROL SULFATE HFA 108 (90 BASE) MCG/ACT IN AERS: 2.0000 | INHALATION_SPRAY | Freq: Four times a day (QID) | RESPIRATORY_TRACT | Status: DC | PRN

## 2012-10-01 MED ORDER — ACETAMINOPHEN 325 MG PO TABS: 650.0000 mg | ORAL_TABLET | Freq: Four times a day (QID) | ORAL | Status: DC | PRN

## 2012-10-01 MED ORDER — ACETAMINOPHEN 650 MG RE SUPP: 650.0000 mg | Freq: Four times a day (QID) | RECTAL | Status: DC | PRN

## 2012-10-01 MED ORDER — POLYETHYLENE GLYCOL 3350 17 GM/SCOOP PO POWD
1.0000 | Freq: Once | ORAL | Status: AC
  Administered 2012-10-01: 1 via ORAL
  Filled 2012-10-01: qty 255

## 2012-10-01 MED ORDER — SODIUM CHLORIDE 0.9 % IV SOLN: INTRAVENOUS | Status: DC

## 2012-10-01 MED ORDER — ONDANSETRON HCL 4 MG/2ML IJ SOLN: 4.0000 mg | Freq: Three times a day (TID) | INTRAMUSCULAR | Status: DC | PRN

## 2012-10-01 MED ORDER — DIPHENHYDRAMINE HCL 25 MG PO CAPS
25.0000 mg | ORAL_CAPSULE | Freq: Four times a day (QID) | ORAL | Status: DC | PRN
  Administered 2012-10-01 – 2012-10-03 (×4): 25 mg via ORAL
  Filled 2012-10-01 (×4): qty 1

## 2012-10-01 MED ORDER — SODIUM CHLORIDE 0.9 % IJ SOLN
3.0000 mL | Freq: Two times a day (BID) | INTRAMUSCULAR | Status: DC
  Administered 2012-10-01 – 2012-10-02 (×3): 3 mL via INTRAVENOUS

## 2012-10-01 MED ORDER — PANTOPRAZOLE SODIUM 40 MG IV SOLR
40.0000 mg | Freq: Two times a day (BID) | INTRAVENOUS | Status: DC
  Administered 2012-10-01: 40 mg via INTRAVENOUS
  Administered 2012-10-01: via INTRAVENOUS
  Administered 2012-10-01 – 2012-10-02 (×3): 40 mg via INTRAVENOUS
  Filled 2012-10-01 (×8): qty 40

## 2012-10-01 MED ORDER — MORPHINE SULFATE 2 MG/ML IJ SOLN
1.0000 mg | INTRAMUSCULAR | Status: DC | PRN
  Administered 2012-10-01 (×2): 1 mg via INTRAVENOUS
  Filled 2012-10-01 (×3): qty 1

## 2012-10-01 MED ORDER — HYDROMORPHONE HCL PF 1 MG/ML IJ SOLN
1.0000 mg | INTRAMUSCULAR | Status: DC | PRN
  Administered 2012-10-01 – 2012-10-03 (×8): 1 mg via INTRAVENOUS
  Filled 2012-10-01 (×8): qty 1

## 2012-10-01 MED ORDER — CYCLOBENZAPRINE HCL 10 MG PO TABS
10.0000 mg | ORAL_TABLET | Freq: Three times a day (TID) | ORAL | Status: DC | PRN
  Administered 2012-10-01 – 2012-10-02 (×4): 10 mg via ORAL
  Filled 2012-10-01 (×4): qty 1

## 2012-10-01 MED ORDER — TEMAZEPAM 15 MG PO CAPS: 15.0000 mg | ORAL_CAPSULE | Freq: Every evening | ORAL | Status: DC | PRN

## 2012-10-01 MED ORDER — ONDANSETRON HCL 4 MG/2ML IJ SOLN
4.0000 mg | Freq: Four times a day (QID) | INTRAMUSCULAR | Status: DC | PRN
  Administered 2012-10-01 – 2012-10-03 (×2): 4 mg via INTRAVENOUS
  Filled 2012-10-01 (×2): qty 2
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
## 2012-10-01 NOTE — Progress Notes (Signed)
Nursing Admission Note  Pt arrived to 6710 via stretcher from the ED. A&Ox4. No distress noted, VSS. Pt c/o lower abdominal pain and bloody stools; states this has been going on for several days. Telemetry in place. Pt placed on r/o C.diff precautions due to loose stools. Oriented to unit and surroundings. Call bell within reach. Will continue to monitor. C.Quintarius Ferns, RN.

## 2012-10-01 NOTE — Progress Notes (Signed)
Pt refusing bed alarm at this time. States she's going to get washed up and doesn't need someone "hovering over her." Will continue to monitor. C.Jeronica Stlouis, RN.

## 2012-10-01 NOTE — ED Provider Notes (Signed)
Medical screening examination/treatment/procedure(s) were conducted as a shared visit with non-physician practitioner(s) and myself.  I personally evaluated the patient during the encounter 41 yo woman with several episodes of bright red rectal bleeding in the setting of cramping lower abdominal pain.  Exam show no abdominal mass or tenderness.  Lab workup and CT of abdomen/pelvis were negative.  She continues with abdominal pain.  I advised admission to treat her pain, complete her workup, probably will need colonoscopy.  Carleene Cooper III, MD 10/01/12 [phone]

## 2012-10-01 NOTE — Progress Notes (Signed)
[facility]                                       Progress note  I have seen and examined patient with history of peptic ulcer disease and tobacco abuse admitted with rectal bleeding over the last 3 days per Dr. Kakarandy and lower abdominal pain. CT scan of abdomen and pelvis negative, her hemoglobin is stable at 14 and she remains hemodynamically stable. She continues to complain of lower abdominal pain this a.m., denies any further diarrhea or rectal bleeding. I have consulted GI for further recommendations. We'll continue current management as per Dr. Kakarandy.  Zamani Crocker C.Veleria Barnhardt [facility] [phone]  

## 2012-10-02 ENCOUNTER — Encounter (HOSPITAL_COMMUNITY)
Admission: EM | Disposition: A | Discharge status: Home / Self Care | Payer: Self-pay | Source: Home / Self Care | Attending: Internal Medicine

## 2012-10-02 ENCOUNTER — Encounter (HOSPITAL_COMMUNITY): Payer: Self-pay | Admitting: *Deleted

## 2012-10-02 HISTORY — PX: COLONOSCOPY: SHX5424

## 2012-10-02 LAB — CBC
HCT: 40.4 % (ref 36.0–46.0)
Hemoglobin: 13.8 g/dL (ref 12.0–15.0)
MCHC: 34.2 g/dL (ref 30.0–36.0)
MCV: 89.8 fL (ref 78.0–100.0)
RDW: 12.6 % (ref 11.5–15.5)

## 2012-10-02 SURGERY — COLONOSCOPY
Anesthesia: Moderate Sedation

## 2012-10-02 MED ORDER — MIDAZOLAM HCL 5 MG/ML IJ SOLN
INTRAMUSCULAR | Status: AC
  Filled 2012-10-02: qty 4

## 2012-10-02 MED ORDER — FENTANYL CITRATE 0.05 MG/ML IJ SOLN
INTRAMUSCULAR | Status: AC
  Filled 2012-10-02: qty 4

## 2012-10-02 MED ORDER — DIPHENHYDRAMINE HCL 50 MG/ML IJ SOLN
INTRAMUSCULAR | Status: DC | PRN
  Administered 2012-10-02: 25 mg via INTRAVENOUS

## 2012-10-02 MED ORDER — FENTANYL CITRATE 0.05 MG/ML IJ SOLN
INTRAMUSCULAR | Status: DC | PRN
  Administered 2012-10-02 (×3): 25 ug via INTRAVENOUS

## 2012-10-02 MED ORDER — DIPHENHYDRAMINE HCL 50 MG/ML IJ SOLN
INTRAMUSCULAR | Status: AC
  Filled 2012-10-02: qty 1

## 2012-10-02 MED ORDER — MIDAZOLAM HCL 5 MG/5ML IJ SOLN
INTRAMUSCULAR | Status: DC | PRN
  Administered 2012-10-02: 2.5 mg via INTRAVENOUS
  Administered 2012-10-02 (×2): 1 mg via INTRAVENOUS
  Administered 2012-10-02: 2.5 mg via INTRAVENOUS
  Administered 2012-10-02: 1 mg via INTRAVENOUS

## 2012-10-02 NOTE — Op Note (Signed)
Eligha Bridegroom [facility] [address] [place], [address]   OPERATIVE PROCEDURE REPORT  PATIENT: Victoria Collier, Victoria Collier  MR#: [id number] BIRTHDATE: 1971/10/29  GENDER: Female ENDOSCOPIST: Jeani Hawking, MD ASSISTANT:   Karie Soda, Technician Dwain Sarna, RN CGRN PROCEDURE DATE: 10/02/2012 PROCEDURE:   Colonoscopy, diagnostic ASA CLASS:   Class III INDICATIONS:hematochezia. MEDICATIONS: Fentanyl 75 mcg IV, Versed 8 mg IV, and Benadryl 25 mg IV  DESCRIPTION OF PROCEDURE:   After the risks benefits and alternatives of the procedure were thoroughly explained, informed consent was obtained.  A digital rectal exam revealed no abnormalities of the rectum.    The     endoscope was introduced through the anus  and advanced to the cecum, which was identified by both the appendix and ileocecal valve , No adverse events experienced.    The quality of the prep was good. .  The instrument was then slowly withdrawn as the colon was fully examined.     COLON FINDINGS: A normal appearing cecum, ileocecal valve, and appendiceal orifice were identified.  The ascending, hepatic flexure, transverse, splenic flexure, descending, sigmoid colon and rectum appeared unremarkable.  No polyps or cancers were seen. Retroflexed views revealed internal/external hemorrhoids.     The scope was then withdrawn from the patient and the procedure terminated.  COMPLICATIONS: There were no complications.  IMPRESSION: 1) Normal colon.  Probable bleeding from hemorrhoids.  RECOMMENDATIONS: 1) Follow HGB. 2) No further GI intervention.  Signing off.   _______________________________ Rosalie DoctorJeani Hawking, MD 10/02/2012 12:02 PM

## 2012-10-02 NOTE — Progress Notes (Signed)
[facility] PROGRESS NOTE  Victoria Collier AVW:[id number] DOB: Apr 04, 1971 DOA: 09/30/2012 PCP: Sheila Oats, MD  Assessment/Plan: #1. Rectal bleeding with lower abdominal pain  -Recheck hemoglobin, so far remaining stable, C. difficile negative. -Appreciate GI assistance, colonoscopy today-follow. #2. History of peptic ulcer disease-continue PPI #3. Tobacco abuse -Was strongly advised to quit smoking.   Code Status: full Family Communication: none available Disposition Plan: To home when stable   Consultants:  GI-Dr.Hung  Procedures:  Colonoscopy-planned for today  Antibiotics:  None  HPI/Subjective: Complaining of nausea overnight,+ lower abdominal pain. Awaiting colonoscopy this a.m.  Objective: Filed Vitals:   10/01/12 1342 10/01/12 1735 10/01/12 2150 10/02/12 0520  BP: 100/54 113/78 128/87 106/74  Pulse: 60 67 70 64  Temp: 97.6 F (36.4 C) 98.3 F (36.8 C) 98.1 F (36.7 C) 98.1 F (36.7 C)  TempSrc: Oral Oral Oral Oral  Resp: 18 17 20 18   Height:      Weight:   104.962 kg (231 lb 6.4 oz)   SpO2: 96% 95% 96% 100%    Intake/Output Summary (Last 24 hours) at 10/02/12 0913 Last data filed at 10/01/12 1300  Gross per 24 hour  Intake    480 ml  Output      0 ml  Net    480 ml   Filed Weights   10/01/12 0122 10/01/12 2150  Weight: 105.2 kg (231 lb 14.8 oz) 104.962 kg (231 lb 6.4 oz)    Exam:   General:  Alert and oriented x3, in no apparent distress.  Cardiovascular: Regular rate and rhythm, S1-S2  Respiratory: Clear to auscultation bilaterally crackles or wheezes  Abdomen: Soft, bowel sounds present,+ lower abdominal tenderness, no rebound and no masses palpable  Data Reviewed: Basic Metabolic Panel:  Lab 10/01/12 8295 09/30/12 1646  NA 138 136  K 3.7 3.7  CL 105 101  CO2 25 25  GLUCOSE 95 113*  BUN 8 10  CREATININE 0.86 0.90  CALCIUM 8.7 9.4  MG -- --  PHOS -- --   Liver Function Tests:  Lab 10/01/12 0514 09/30/12 1646    AST 18 21  ALT 20 23  ALKPHOS 60 73  BILITOT 0.4 0.3  PROT 6.2 7.1  ALBUMIN 3.6 4.0   No results found for this basename: LIPASE:5,AMYLASE:5 in the last 168 hours No results found for this basename: AMMONIA:5 in the last 168 hours CBC:  Lab 10/01/12 0844 10/01/12 0514 10/01/12 0139 09/30/12 1850 09/30/12 1646  WBC 6.3 6.6 7.8 -- 6.5  NEUTROABS -- -- -- 3.8 --  HGB 13.8 14.0 13.8 -- 14.6  HCT 41.7 41.1 40.2 -- 43.2  MCV 90.8 90.3 89.9 -- 90.2  PLT 120* 196 186 -- 117*   Cardiac Enzymes: No results found for this basename: CKTOTAL:5,CKMB:5,CKMBINDEX:5,TROPONINI:5 in the last 168 hours BNP (last 3 results)  Basename 09/08/12 2116  PROBNP 20.5   CBG: No results found for this basename: GLUCAP:5 in the last 168 hours  No results found for this or any previous visit (from the past 240 hour(s)).   Studies: Ct Abdomen Pelvis W Contrast  09/30/2012  *RADIOLOGY REPORT*  Clinical Data: 41 year old female with abdominal and pelvic pain and GI bleed.  CT ABDOMEN AND PELVIS WITH CONTRAST  Technique:  Multidetector CT imaging of the abdomen and pelvis was performed following the standard protocol during bolus administration of intravenous contrast.  Contrast: OMNIPAQUE IOHEXOL 300 MG/ML  SOLN  Comparison: 04/18/2011 CT  Findings: The liver, gallbladder, spleen, adrenal glands, pancreas, and kidneys  are unremarkable. No free fluid, enlarged lymph nodes, biliary dilation or abdominal aortic aneurysm identified. The bowel, appendix and bladder are unremarkable. There is no evidence of bowel obstruction or pneumoperitoneum.  No acute or suspicious bony abnormalities are identified.  IMPRESSION: No acute or significant abnormalities identified.   Original Report Authenticated By: Rosendo Gros, M.D.     Scheduled Meds:   . pantoprazole (PROTONIX) IV  40 mg Intravenous Q12H  . polyethylene glycol powder  1 Container Oral Once  . sodium chloride  3 mL Intravenous Q12H   Continuous  Infusions:   . sodium chloride 75 mL/hr at 10/01/12 0152  . sodium chloride      Principal Problem:  *Rectal bleeding Active Problems:  PUD (peptic ulcer disease)  Tobacco abuse    Time spent:    Kela Millin  [facility] Pager [phone]. If 8PM-8AM, please contact night-coverage at [email or web address], password [email or web address] 10/02/2012, 9:13 AM  LOS: 2 days

## 2012-10-02 NOTE — H&P (View-Only) (Signed)
[facility]                                       Progress note  I have seen and examined patient with history of peptic ulcer disease and tobacco abuse admitted with rectal bleeding over the last 3 days per Dr. Eilene Ghazi and lower abdominal pain. CT scan of abdomen and pelvis negative, her hemoglobin is stable at 14 and she remains hemodynamically stable. She continues to complain of lower abdominal pain this a.m., denies any further diarrhea or rectal bleeding. I have consulted GI for further recommendations. We'll continue current management as per Dr. Eilene Ghazi.  Jasnoor Trussell C.Omere Marti [facility] [phone]

## 2012-10-02 NOTE — Interval H&P Note (Signed)
History and Physical Interval Note:  10/02/2012 11:16 AM  Victoria Collier  has presented today for surgery, with the diagnosis of Hematochezia  The various methods of treatment have been discussed with the patient and family. After consideration of risks, benefits and other options for treatment, the patient has consented to  Procedure(s) (LRB) with comments: COLONOSCOPY (N/A) as a surgical intervention .  The patient's history has been reviewed, patient examined, no change in status, stable for surgery.  I have reviewed the patient's chart and labs.  Questions were answered to the patient's satisfaction.     Victoria Collier D

## 2012-10-03 LAB — CBC
HCT: 41.5 % (ref 36.0–46.0)
Hemoglobin: 13.9 g/dL (ref 12.0–15.0)
MCH: 30 pg (ref 26.0–34.0)
MCHC: 33.5 g/dL (ref 30.0–36.0)
RDW: 12.7 % (ref 11.5–15.5)

## 2012-10-03 LAB — BASIC METABOLIC PANEL
BUN: 8 mg/dL (ref 6–23)
Creatinine, Ser: 0.93 mg/dL (ref 0.50–1.10)
GFR calc Af Amer: 86 mL/min — ABNORMAL LOW (ref >90)
GFR calc non Af Amer: 74 mL/min — ABNORMAL LOW (ref >90)
Glucose, Bld: 89 mg/dL (ref 70–99)
Potassium: 4 mEq/L (ref 3.5–5.1)

## 2012-10-03 MED ORDER — DICYCLOMINE HCL 20 MG PO TABS: 20.0000 mg | ORAL_TABLET | Freq: Three times a day (TID) | ORAL | Status: DC | PRN

## 2012-10-03 MED ORDER — PSYLLIUM 58.6 % PO POWD: 1.0000 | Freq: Three times a day (TID) | ORAL | Status: DC

## 2012-10-03 MED ORDER — CYCLOBENZAPRINE HCL 10 MG PO TABS: 10.0000 mg | ORAL_TABLET | Freq: Three times a day (TID) | ORAL | Status: DC | PRN

## 2012-10-03 NOTE — Discharge Summary (Signed)
Physician Discharge Summary  Victoria Collier ZOX:[id number] DOB: [birth year] DOA: 09/30/2012  PCP: Sheila Oats, MD  Admit date: 09/30/2012 Discharge date: 10/03/2012  Recommendations for Outpatient Follow-up:      Follow-up Information    Please follow up. (PCP-[facility] in 1-2weeks, call for appt upon discharge)          Discharge Diagnoses:  Principal Problem:  *Rectal bleeding Active Problems:  PUD (peptic ulcer disease)  Tobacco abuse  internal/external hemorrhoids  Discharge Condition: Improved/stable  Diet recommendation: High-fiber diet  Filed Weights   10/01/12 0122 10/01/12 2150 10/02/12 2104  Weight: 105.2 kg (231 lb 14.8 oz) 104.962 kg (231 lb 6.4 oz) 104.962 kg (231 lb 6.4 oz)    History of present illness:  41 year old female with history of peptic ulcer disease and tobacco abuse presents with complaints of rectal bleeding over the last 3 days. 3 days ago she had one bowel movement which was bloody. Over last 2 days it worsened and yesterday she had 3-4 bowel movements is frankly bloody and also started developing lower bowel pain. Denies any dizziness or loss of consciousness. Denies having similar symptoms previously. Denies any fever chills. Denies using NSAIDs or any recent antibiotics. In the ER patient was found to be hemodynamically stable with hemoglobin around 14 but stool for guaiac was positive. Patient at this time as been admitted for further management. Since patient complained of abdominal pain CT abdomen and pelvis was done which was unremarkable.   Hospital Course by problem list:  #1. Rectal bleeding with lower abdominal pain  -Upon admission the patient had serial hemoglobins ordered and they remained stable. GI was consulted and Dr. Elnoria Howard saw the patient and a colonoscopy was done which revealed internal/external hemorrhoids but was otherwise negative. She was started on a regular diet which she tolerated. Her hemoglobin on Recheck  hemoglobin has remained stable and she will be discharged at this time to follow up outpatient. She reported that she has constipation most of the time but also diarrhea sometimes as on admission, ,with a lower abdominal cramping the symptoms could be due to possible irritable bowel syndrome. she has been  started on Bentyl and high-fiber diet, Metamucil also recommended, she is to follow up outpatient with PCP .she had a C. difficile in the hospital and it came back negative.  #2. History of peptic ulcer disease-continue PPI upon discharge  #3. Tobacco abuse -Was strongly advised to quit smoking.   Procedures:  Colonoscopy-internal/external hemorrhoids, otherwise negative.  Consultations:  GI-Dr. Elnoria Howard  Discharge Exam: Filed Vitals:   10/02/12 1429 10/02/12 1800 10/02/12 2104 10/03/12 0533  BP: 116/85 121/70 113/81 111/80  Pulse:  68 70 65  Temp: 98.1 F (36.7 C) 98.2 F (36.8 C) 97.5 F (36.4 C) 98.4 F (36.9 C)  TempSrc: Oral Oral Oral Oral  Resp:  18 18 18   Height:   5\' 5"  (1.651 m)   Weight:   104.962 kg (231 lb 6.4 oz)   SpO2: 93% 94% 93% 93%  Exam:  General: Alert and oriented x3, in no apparent distress.  Cardiovascular: Regular rate and rhythm, S1-S2  Respiratory: Clear to auscultation bilaterally crackles or wheezes  Abdomen: Soft, bowel sounds present,decreased lower abdominal tenderness, no rebound and no masses palpable      Discharge Instructions  Discharge Orders    Future Orders Please Complete By Expires   Diet high fiber      Increase activity slowly          Medication  List     As of 10/03/2012  9:57 AM    STOP taking these medications         albuterol 108 (90 BASE) MCG/ACT inhaler   Commonly known as: PROVENTIL HFA;VENTOLIN HFA      TAKE these medications         cyclobenzaprine 10 MG tablet   Commonly known as: FLEXERIL   Take 1 tablet (10 mg total) by mouth 3 (three) times daily as needed. For muscle spasms      dicyclomine 20 MG  tablet   Commonly known as: BENTYL   Take 1 tablet (20 mg total) by mouth 3 (three) times daily as needed (cramping).      omeprazole 40 MG capsule   Commonly known as: PRILOSEC   Take 40 mg by mouth daily.      psyllium 58.6 % powder   Commonly known as: METAMUCIL   Take 1 packet by mouth 3 (three) times daily.           Follow-up Information    Please follow up. (PCP-[facility] in 1-2weeks, call for appt upon discharge)           The results of significant diagnostics from this hospitalization (including imaging, microbiology, ancillary and laboratory) are listed below for reference.    Significant Diagnostic Studies: Dg Chest 2 View  09/08/2012  *RADIOLOGY REPORT*  Clinical Data: Right chest pain.  CHEST - 2 VIEW  Comparison: 03/26/2012  Findings: Diffuse interstitial pattern in the lungs suggesting fibrosis or chronic bronchitic changes.  No focal airspace consolidation.  No blunting of costophrenic angles.  No pneumothorax.  Mediastinal contours appear intact.  Normal heart size and pulmonary vascularity.  No significant change since previous study.  IMPRESSION: No evidence of active pulmonary disease.   Original Report Authenticated By: Marlon Pel, M.D.    Ct Abdomen Pelvis W Contrast  09/30/2012  *RADIOLOGY REPORT*  Clinical Data: 41 year old female with abdominal and pelvic pain and GI bleed.  CT ABDOMEN AND PELVIS WITH CONTRAST  Technique:  Multidetector CT imaging of the abdomen and pelvis was performed following the standard protocol during bolus administration of intravenous contrast.  Contrast: OMNIPAQUE IOHEXOL 300 MG/ML  SOLN  Comparison: 04/18/2011 CT  Findings: The liver, gallbladder, spleen, adrenal glands, pancreas, and kidneys are unremarkable. No free fluid, enlarged lymph nodes, biliary dilation or abdominal aortic aneurysm identified. The bowel, appendix and bladder are unremarkable. There is no evidence of bowel obstruction or  pneumoperitoneum.  No acute or suspicious bony abnormalities are identified.  IMPRESSION: No acute or significant abnormalities identified.   Original Report Authenticated By: Rosendo Gros, M.D.     Microbiology: Recent Results (from the past 240 hour(s))  CLOSTRIDIUM DIFFICILE BY PCR     Status: Normal   Collection Time   10/01/12  9:58 PM      Component Value Range Status Comment   C difficile by pcr NEGATIVE  NEGATIVE Final      Labs: Basic Metabolic Panel:  Lab 10/03/12 1610 10/01/12 0514 09/30/12 1646  NA 136 138 136  K 4.0 3.7 3.7  CL 102 105 101  CO2 23 25 25   GLUCOSE 89 95 113*  BUN 8 8 10   CREATININE 0.93 0.86 0.90  CALCIUM 9.2 8.7 9.4  MG -- -- --  PHOS -- -- --   Liver Function Tests:  Lab 10/01/12 0514 09/30/12 1646  AST 18 21  ALT 20 23  ALKPHOS 60 73  BILITOT 0.4 0.3  PROT 6.2 7.1  ALBUMIN 3.6 4.0   No results found for this basename: LIPASE:5,AMYLASE:5 in the last 168 hours No results found for this basename: AMMONIA:5 in the last 168 hours CBC:  Lab 10/03/12 0500 10/02/12 1527 10/01/12 0844 10/01/12 0514 10/01/12 0139 09/30/12 1850  WBC 8.7 6.6 6.3 6.6 7.8 --  NEUTROABS -- -- -- -- -- 3.8  HGB 13.9 13.8 13.8 14.0 13.8 --  HCT 41.5 40.4 41.7 41.1 40.2 --  MCV 89.6 89.8 90.8 90.3 89.9 --  PLT 150 186 120* 196 186 --   Cardiac Enzymes: No results found for this basename: CKTOTAL:5,CKMB:5,CKMBINDEX:5,TROPONINI:5 in the last 168 hours BNP: BNP (last 3 results)  Basename 09/08/12 2116  PROBNP 20.5   CBG: No results found for this basename: GLUCAP:5 in the last 168 hours  Time coordinating discharge: <17minutes  Signed:  Vahan Wadsworth C  [facility] 10/03/2012, 9:57 AM

## 2012-10-03 NOTE — Progress Notes (Signed)
Patient discharge home, discharge instruction given and explained to patient, copies of all form given and explained.

## 2012-10-05 ENCOUNTER — Encounter (HOSPITAL_COMMUNITY): Payer: Self-pay

## 2012-10-05 ENCOUNTER — Encounter (HOSPITAL_COMMUNITY): Payer: Self-pay | Admitting: Gastroenterology

## 2012-11-01 ENCOUNTER — Emergency Department (HOSPITAL_COMMUNITY)
Admission: EM | Admit: 2012-11-01 | Discharge: 2012-11-01 | Disposition: A | Discharge status: Home / Self Care | Payer: Medicaid Other | Attending: Emergency Medicine | Admitting: Emergency Medicine

## 2012-11-01 ENCOUNTER — Emergency Department (HOSPITAL_COMMUNITY): Payer: Medicaid Other

## 2012-11-01 ENCOUNTER — Encounter (HOSPITAL_COMMUNITY): Payer: Self-pay | Admitting: Emergency Medicine

## 2012-11-01 DIAGNOSIS — W010XXA Fall on same level from slipping, tripping and stumbling without subsequent striking against object, initial encounter: Secondary | ICD-10-CM | POA: Insufficient documentation

## 2012-11-01 DIAGNOSIS — Z8719 Personal history of other diseases of the digestive system: Secondary | ICD-10-CM | POA: Insufficient documentation

## 2012-11-01 DIAGNOSIS — Z8739 Personal history of other diseases of the musculoskeletal system and connective tissue: Secondary | ICD-10-CM | POA: Insufficient documentation

## 2012-11-01 DIAGNOSIS — Y929 Unspecified place or not applicable: Secondary | ICD-10-CM | POA: Insufficient documentation

## 2012-11-01 DIAGNOSIS — S139XXA Sprain of joints and ligaments of unspecified parts of neck, initial encounter: Secondary | ICD-10-CM | POA: Insufficient documentation

## 2012-11-01 DIAGNOSIS — IMO0002 Reserved for concepts with insufficient information to code with codable children: Secondary | ICD-10-CM | POA: Insufficient documentation

## 2012-11-01 DIAGNOSIS — Y9389 Activity, other specified: Secondary | ICD-10-CM | POA: Insufficient documentation

## 2012-11-01 DIAGNOSIS — Z8679 Personal history of other diseases of the circulatory system: Secondary | ICD-10-CM | POA: Insufficient documentation

## 2012-11-01 DIAGNOSIS — Z8659 Personal history of other mental and behavioral disorders: Secondary | ICD-10-CM | POA: Insufficient documentation

## 2012-11-01 DIAGNOSIS — F17200 Nicotine dependence, unspecified, uncomplicated: Secondary | ICD-10-CM | POA: Insufficient documentation

## 2012-11-01 DIAGNOSIS — J45909 Unspecified asthma, uncomplicated: Secondary | ICD-10-CM | POA: Insufficient documentation

## 2012-11-01 DIAGNOSIS — S46912A Strain of unspecified muscle, fascia and tendon at shoulder and upper arm level, left arm, initial encounter: Secondary | ICD-10-CM

## 2012-11-01 DIAGNOSIS — K219 Gastro-esophageal reflux disease without esophagitis: Secondary | ICD-10-CM | POA: Insufficient documentation

## 2012-11-01 DIAGNOSIS — S161XXA Strain of muscle, fascia and tendon at neck level, initial encounter: Secondary | ICD-10-CM

## 2012-11-01 MED ORDER — METHOCARBAMOL 500 MG PO TABS: 500.0000 mg | ORAL_TABLET | Freq: Two times a day (BID) | ORAL | Status: DC

## 2012-11-01 MED ORDER — KETOROLAC TROMETHAMINE 60 MG/2ML IM SOLN
60.0000 mg | Freq: Once | INTRAMUSCULAR | Status: AC
  Administered 2012-11-01: 60 mg via INTRAMUSCULAR
  Filled 2012-11-01: qty 2

## 2012-11-01 MED ORDER — METHOCARBAMOL 500 MG PO TABS
1000.0000 mg | ORAL_TABLET | ORAL | Status: AC
  Administered 2012-11-01: 1000 mg via ORAL
  Filled 2012-11-01: qty 2

## 2012-11-01 MED ORDER — METHOCARBAMOL 100 MG/ML IJ SOLN
1000.0000 mg | Freq: Once | INTRAMUSCULAR | Status: DC
  Filled 2012-11-01: qty 10
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
## 2012-11-01 NOTE — ED Notes (Signed)
Collar can be removed per NP
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
## 2012-12-17 ENCOUNTER — Emergency Department (HOSPITAL_COMMUNITY): Payer: Medicaid Other

## 2012-12-17 ENCOUNTER — Encounter (HOSPITAL_COMMUNITY): Payer: Self-pay | Admitting: *Deleted

## 2012-12-17 ENCOUNTER — Emergency Department (HOSPITAL_COMMUNITY)
Admission: EM | Admit: 2012-12-17 | Discharge: 2012-12-17 | Disposition: A | Discharge status: Home / Self Care | Payer: Medicaid Other | Attending: Emergency Medicine | Admitting: Emergency Medicine

## 2012-12-17 DIAGNOSIS — R059 Cough, unspecified: Secondary | ICD-10-CM | POA: Insufficient documentation

## 2012-12-17 DIAGNOSIS — Z8739 Personal history of other diseases of the musculoskeletal system and connective tissue: Secondary | ICD-10-CM | POA: Insufficient documentation

## 2012-12-17 DIAGNOSIS — J45909 Unspecified asthma, uncomplicated: Secondary | ICD-10-CM | POA: Insufficient documentation

## 2012-12-17 DIAGNOSIS — R079 Chest pain, unspecified: Secondary | ICD-10-CM

## 2012-12-17 DIAGNOSIS — K219 Gastro-esophageal reflux disease without esophagitis: Secondary | ICD-10-CM | POA: Insufficient documentation

## 2012-12-17 DIAGNOSIS — G8929 Other chronic pain: Secondary | ICD-10-CM | POA: Insufficient documentation

## 2012-12-17 DIAGNOSIS — F17200 Nicotine dependence, unspecified, uncomplicated: Secondary | ICD-10-CM | POA: Insufficient documentation

## 2012-12-17 DIAGNOSIS — M549 Dorsalgia, unspecified: Secondary | ICD-10-CM | POA: Insufficient documentation

## 2012-12-17 DIAGNOSIS — Z79899 Other long term (current) drug therapy: Secondary | ICD-10-CM | POA: Insufficient documentation

## 2012-12-17 DIAGNOSIS — R05 Cough: Secondary | ICD-10-CM | POA: Insufficient documentation

## 2012-12-17 DIAGNOSIS — Z8659 Personal history of other mental and behavioral disorders: Secondary | ICD-10-CM | POA: Insufficient documentation

## 2012-12-17 DIAGNOSIS — M940 Chondrocostal junction syndrome [Tietze]: Secondary | ICD-10-CM | POA: Insufficient documentation

## 2012-12-17 DIAGNOSIS — M542 Cervicalgia: Secondary | ICD-10-CM | POA: Insufficient documentation

## 2012-12-17 DIAGNOSIS — Z8679 Personal history of other diseases of the circulatory system: Secondary | ICD-10-CM | POA: Insufficient documentation

## 2012-12-17 DIAGNOSIS — Z8719 Personal history of other diseases of the digestive system: Secondary | ICD-10-CM | POA: Insufficient documentation

## 2012-12-17 HISTORY — DX: Other chest pain: R07.89

## 2012-12-17 LAB — BASIC METABOLIC PANEL
BUN: 10 mg/dL (ref 6–23)
GFR calc non Af Amer: 84 mL/min — ABNORMAL LOW (ref >90)
Glucose, Bld: 105 mg/dL — ABNORMAL HIGH (ref 70–99)
Potassium: 3.7 mEq/L (ref 3.5–5.1)

## 2012-12-17 LAB — CBC
HCT: 42.2 % (ref 36.0–46.0)
Hemoglobin: 14.2 g/dL (ref 12.0–15.0)
MCH: 30.1 pg (ref 26.0–34.0)
MCHC: 33.6 g/dL (ref 30.0–36.0)

## 2012-12-17 LAB — POCT I-STAT TROPONIN I

## 2012-12-17 MED ORDER — NITROGLYCERIN 0.4 MG SL SUBL: 0.4000 mg | SUBLINGUAL_TABLET | SUBLINGUAL | Status: DC | PRN

## 2012-12-17 MED ORDER — NITROGLYCERIN 0.4 MG SL SUBL
SUBLINGUAL_TABLET | SUBLINGUAL | Status: AC
  Filled 2012-12-17: qty 25

## 2012-12-17 MED ORDER — PREDNISONE 20 MG PO TABS: ORAL_TABLET | ORAL | Status: DC

## 2012-12-17 MED ORDER — MORPHINE SULFATE 4 MG/ML IJ SOLN
4.0000 mg | Freq: Once | INTRAMUSCULAR | Status: AC
  Administered 2012-12-17: 4 mg via INTRAMUSCULAR
  Filled 2012-12-17: qty 1
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
## 2012-12-17 NOTE — ED Notes (Signed)
Pt refuses additional nitro, c/o headache rated 7/10 throbbing. A&Ox4, ambulatory with cane and 1 person assist. CP tightness, 8/10.

## 2012-12-17 NOTE — ED Notes (Signed)
Pt states that while watching TV, began having central CP with radiation to her back. Describes pain as a pressure, "like someone has their foot on my chest".  Nausea, weakness.

## 2012-12-17 NOTE — ED Notes (Signed)
Pt discharged.Vital signs stable and GCS 15 

## 2012-12-20 NOTE — ED Provider Notes (Signed)
I saw and evaluated the patient, reviewed the resident's note and I agree with the findings and plan.  Patient with history of Chest wall pain, Reflux, and chronic back pain. Presents with chest pain onset earlier today anterior chest radiating to back. Work up negative for acute cardiac event, pneumonia or pneumothorax.   EKG without acute changes, sinus, rate 82. Nl axis.    Results for orders placed during the hospital encounter of 12/17/12  CBC      Component Value Range   WBC 8.6  4.0 - 10.5 K/uL   RBC 4.71  3.87 - 5.11 MIL/uL   Hemoglobin 14.2  12.0 - 15.0 g/dL   HCT 16.1  09.6 - 04.5 %   MCV 89.6  78.0 - 100.0 fL   MCH 30.1  26.0 - 34.0 pg   MCHC 33.6  30.0 - 36.0 g/dL   RDW 40.9  81.1 - 91.4 %   Platelets 160  150 - 400 K/uL  BASIC METABOLIC PANEL      Component Value Range   Sodium 135  135 - 145 mEq/L   Potassium 3.7  3.5 - 5.1 mEq/L   Chloride 101  96 - 112 mEq/L   CO2 23  19 - 32 mEq/L   Glucose, Bld 105 (*) 70 - 99 mg/dL   BUN 10  6 - 23 mg/dL   Creatinine, Ser 7.82  0.50 - 1.10 mg/dL   Calcium 9.3  8.4 - 95.6 mg/dL   GFR calc non Af Amer 84 (*) >90 mL/min   GFR calc Af Amer >90  >90 mL/min  PRO B NATRIURETIC PEPTIDE      Component Value Range   Pro B Natriuretic peptide (BNP) <5.0  0 - 125 pg/mL  POCT I-STAT TROPONIN I      Component Value Range   Troponin i, poc 0.00  0.00 - 0.08 ng/mL   Comment 3            Dg Chest 2 View  12/17/2012  *RADIOLOGY REPORT*  Clinical Data: Chest pain  CHEST - 2 VIEW  Comparison: 09/08/2012  Findings: Lungs are essentially clear.  No focal consolidation. No pleural effusion or pneumothorax.  Cardiomediastinal silhouette is within normal limits.  Visualized osseous structures are within normal limits.  IMPRESSION: No evidence of acute cardiopulmonary disease.   Original Report Authenticated By: Charline Bills, M.D.        Shelda Jakes, MD 12/20/12 1950

## 2012-12-30 ENCOUNTER — Emergency Department (HOSPITAL_COMMUNITY)
Admission: EM | Admit: 2012-12-30 | Discharge: 2012-12-30 | Disposition: A | Discharge status: Home / Self Care | Payer: Medicaid Other | Attending: Emergency Medicine | Admitting: Emergency Medicine

## 2012-12-30 ENCOUNTER — Emergency Department (HOSPITAL_COMMUNITY): Payer: Medicaid Other

## 2012-12-30 ENCOUNTER — Encounter (HOSPITAL_COMMUNITY): Payer: Self-pay | Admitting: *Deleted

## 2012-12-30 DIAGNOSIS — R6889 Other general symptoms and signs: Secondary | ICD-10-CM | POA: Insufficient documentation

## 2012-12-30 DIAGNOSIS — G8929 Other chronic pain: Secondary | ICD-10-CM | POA: Insufficient documentation

## 2012-12-30 DIAGNOSIS — R5383 Other fatigue: Secondary | ICD-10-CM | POA: Insufficient documentation

## 2012-12-30 DIAGNOSIS — R062 Wheezing: Secondary | ICD-10-CM | POA: Insufficient documentation

## 2012-12-30 DIAGNOSIS — M549 Dorsalgia, unspecified: Secondary | ICD-10-CM | POA: Insufficient documentation

## 2012-12-30 DIAGNOSIS — Z8719 Personal history of other diseases of the digestive system: Secondary | ICD-10-CM | POA: Insufficient documentation

## 2012-12-30 DIAGNOSIS — M129 Arthropathy, unspecified: Secondary | ICD-10-CM | POA: Insufficient documentation

## 2012-12-30 DIAGNOSIS — J40 Bronchitis, not specified as acute or chronic: Secondary | ICD-10-CM | POA: Insufficient documentation

## 2012-12-30 DIAGNOSIS — J029 Acute pharyngitis, unspecified: Secondary | ICD-10-CM | POA: Insufficient documentation

## 2012-12-30 DIAGNOSIS — Z8659 Personal history of other mental and behavioral disorders: Secondary | ICD-10-CM | POA: Insufficient documentation

## 2012-12-30 DIAGNOSIS — R509 Fever, unspecified: Secondary | ICD-10-CM | POA: Insufficient documentation

## 2012-12-30 DIAGNOSIS — K219 Gastro-esophageal reflux disease without esophagitis: Secondary | ICD-10-CM | POA: Insufficient documentation

## 2012-12-30 DIAGNOSIS — G43909 Migraine, unspecified, not intractable, without status migrainosus: Secondary | ICD-10-CM | POA: Insufficient documentation

## 2012-12-30 DIAGNOSIS — M542 Cervicalgia: Secondary | ICD-10-CM | POA: Insufficient documentation

## 2012-12-30 DIAGNOSIS — R5381 Other malaise: Secondary | ICD-10-CM | POA: Insufficient documentation

## 2012-12-30 DIAGNOSIS — R0982 Postnasal drip: Secondary | ICD-10-CM | POA: Insufficient documentation

## 2012-12-30 DIAGNOSIS — J45909 Unspecified asthma, uncomplicated: Secondary | ICD-10-CM | POA: Insufficient documentation

## 2012-12-30 DIAGNOSIS — J3489 Other specified disorders of nose and nasal sinuses: Secondary | ICD-10-CM | POA: Insufficient documentation

## 2012-12-30 DIAGNOSIS — F17200 Nicotine dependence, unspecified, uncomplicated: Secondary | ICD-10-CM | POA: Insufficient documentation

## 2012-12-30 DIAGNOSIS — Z79899 Other long term (current) drug therapy: Secondary | ICD-10-CM | POA: Insufficient documentation

## 2012-12-30 LAB — COMPREHENSIVE METABOLIC PANEL
BUN: 13 mg/dL (ref 6–23)
CO2: 27 mEq/L (ref 19–32)
Chloride: 102 mEq/L (ref 96–112)
Creatinine, Ser: 0.86 mg/dL (ref 0.50–1.10)
GFR calc non Af Amer: 82 mL/min — ABNORMAL LOW (ref >90)
Glucose, Bld: 110 mg/dL — ABNORMAL HIGH (ref 70–99)
Total Bilirubin: 0.2 mg/dL — ABNORMAL LOW (ref 0.3–1.2)

## 2012-12-30 LAB — CBC WITH DIFFERENTIAL/PLATELET
Basophils Relative: 0 % (ref 0–1)
HCT: 41.4 % (ref 36.0–46.0)
Hemoglobin: 13.7 g/dL (ref 12.0–15.0)
Lymphocytes Relative: 37 % (ref 12–46)
Lymphs Abs: 2.1 10*3/uL (ref 0.7–4.0)
MCHC: 33.1 g/dL (ref 30.0–36.0)
Monocytes Absolute: 0.6 10*3/uL (ref 0.1–1.0)
Monocytes Relative: 11 % (ref 3–12)
Neutro Abs: 2.9 10*3/uL (ref 1.7–7.7)

## 2012-12-30 LAB — POCT I-STAT TROPONIN I: Troponin i, poc: 0 ng/mL (ref 0.00–0.08)

## 2012-12-30 LAB — POCT PREGNANCY, URINE: Preg Test, Ur: NEGATIVE

## 2012-12-30 MED ORDER — AZITHROMYCIN 250 MG PO TABS: ORAL_TABLET | ORAL | Status: DC

## 2012-12-30 MED ORDER — GUAIFENESIN-CODEINE 100-10 MG/5ML PO SYRP: 5.0000 mL | ORAL_SOLUTION | Freq: Three times a day (TID) | ORAL | Status: DC | PRN
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
## 2012-12-30 NOTE — ED Notes (Addendum)
Pt states seen here last week for CP. Pt states diagnosed with Chest wall pain from her lungs rubbing together and was sent home with muscle relaxants. Pt taking them regularly with no relief. Pt states that she is also been having cough, sneezing, migraines, weakness, loss of appetite, sweats/chills, dizziness, wheezing, loss of voice, blurred vision, body aches... etc since friday or a week and a half after christmas (pt unsure just been trying to fight it) pt also been urinating frequently.

## 2012-12-30 NOTE — ED Notes (Signed)
Reports squeezing central chest pain x10 days, was seen here previously for same. Pt. Reports chills, body aches, productive cough, migraines, weakness, decreased appetite, increased urination.

## 2013-01-31 ENCOUNTER — Encounter (HOSPITAL_COMMUNITY): Payer: Self-pay | Admitting: Physical Medicine and Rehabilitation

## 2013-01-31 ENCOUNTER — Emergency Department (HOSPITAL_COMMUNITY)
Admission: EM | Admit: 2013-01-31 | Discharge: 2013-01-31 | Disposition: A | Discharge status: Home / Self Care | Payer: Medicaid Other | Attending: Emergency Medicine | Admitting: Emergency Medicine

## 2013-01-31 DIAGNOSIS — J45909 Unspecified asthma, uncomplicated: Secondary | ICD-10-CM | POA: Insufficient documentation

## 2013-01-31 DIAGNOSIS — Z8739 Personal history of other diseases of the musculoskeletal system and connective tissue: Secondary | ICD-10-CM | POA: Insufficient documentation

## 2013-01-31 DIAGNOSIS — H109 Unspecified conjunctivitis: Secondary | ICD-10-CM

## 2013-01-31 DIAGNOSIS — Z8679 Personal history of other diseases of the circulatory system: Secondary | ICD-10-CM | POA: Insufficient documentation

## 2013-01-31 DIAGNOSIS — L299 Pruritus, unspecified: Secondary | ICD-10-CM | POA: Insufficient documentation

## 2013-01-31 DIAGNOSIS — R51 Headache: Secondary | ICD-10-CM | POA: Insufficient documentation

## 2013-01-31 DIAGNOSIS — Z8659 Personal history of other mental and behavioral disorders: Secondary | ICD-10-CM | POA: Insufficient documentation

## 2013-01-31 DIAGNOSIS — K219 Gastro-esophageal reflux disease without esophagitis: Secondary | ICD-10-CM | POA: Insufficient documentation

## 2013-01-31 DIAGNOSIS — F17200 Nicotine dependence, unspecified, uncomplicated: Secondary | ICD-10-CM | POA: Insufficient documentation

## 2013-01-31 DIAGNOSIS — Z79899 Other long term (current) drug therapy: Secondary | ICD-10-CM | POA: Insufficient documentation

## 2013-01-31 DIAGNOSIS — H5789 Other specified disorders of eye and adnexa: Secondary | ICD-10-CM | POA: Insufficient documentation

## 2013-01-31 DIAGNOSIS — H53149 Visual discomfort, unspecified: Secondary | ICD-10-CM | POA: Insufficient documentation

## 2013-01-31 DIAGNOSIS — Z87828 Personal history of other (healed) physical injury and trauma: Secondary | ICD-10-CM | POA: Insufficient documentation

## 2013-01-31 DIAGNOSIS — Z8719 Personal history of other diseases of the digestive system: Secondary | ICD-10-CM | POA: Insufficient documentation

## 2013-01-31 DIAGNOSIS — H538 Other visual disturbances: Secondary | ICD-10-CM | POA: Insufficient documentation

## 2013-01-31 MED ORDER — TETRACAINE HCL 0.5 % OP SOLN
2.0000 [drp] | Freq: Once | OPHTHALMIC | Status: AC
  Administered 2013-01-31: 2 [drp] via OPHTHALMIC
  Filled 2013-01-31: qty 2

## 2013-01-31 MED ORDER — FLUORESCEIN SODIUM 1 MG OP STRP
1.0000 | ORAL_STRIP | Freq: Once | OPHTHALMIC | Status: AC
  Administered 2013-01-31: 14:00:00 via OPHTHALMIC
  Filled 2013-01-31: qty 1

## 2013-01-31 MED ORDER — ERYTHROMYCIN 5 MG/GM OP OINT
1.0000 "application " | TOPICAL_OINTMENT | Freq: Once | OPHTHALMIC | Status: AC
  Administered 2013-01-31: 1 via OPHTHALMIC
  Filled 2013-01-31: qty 1
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
## 2013-01-31 NOTE — ED Notes (Signed)
Pt presents to department for evaluation of L eye redness and irritation. Ongoing x2 weeks. Pt states yellow drainage from eye, redness and itching. 5/10 discomfort at the time. Also states blurred vision. She is alert and oriented x4.

## 2013-02-03 NOTE — ED Provider Notes (Signed)
Medical screening examination/treatment/procedure(s) were performed by non-physician practitioner and as supervising physician I was immediately available for consultation/collaboration.   Gavin Pound. Duyen Beckom, MD 02/03/13 1478

## 2013-02-04 ENCOUNTER — Emergency Department (HOSPITAL_COMMUNITY)
Admission: EM | Admit: 2013-02-04 | Discharge: 2013-02-04 | Disposition: A | Discharge status: Home / Self Care | Payer: Medicaid Other | Attending: Emergency Medicine | Admitting: Emergency Medicine

## 2013-02-04 ENCOUNTER — Encounter (HOSPITAL_COMMUNITY): Payer: Self-pay | Admitting: *Deleted

## 2013-02-04 ENCOUNTER — Emergency Department (HOSPITAL_COMMUNITY): Payer: Medicaid Other

## 2013-02-04 DIAGNOSIS — G8929 Other chronic pain: Secondary | ICD-10-CM | POA: Insufficient documentation

## 2013-02-04 DIAGNOSIS — M549 Dorsalgia, unspecified: Secondary | ICD-10-CM | POA: Insufficient documentation

## 2013-02-04 DIAGNOSIS — Z8739 Personal history of other diseases of the musculoskeletal system and connective tissue: Secondary | ICD-10-CM | POA: Insufficient documentation

## 2013-02-04 DIAGNOSIS — J45909 Unspecified asthma, uncomplicated: Secondary | ICD-10-CM | POA: Insufficient documentation

## 2013-02-04 DIAGNOSIS — R0789 Other chest pain: Secondary | ICD-10-CM

## 2013-02-04 DIAGNOSIS — Z8679 Personal history of other diseases of the circulatory system: Secondary | ICD-10-CM | POA: Insufficient documentation

## 2013-02-04 DIAGNOSIS — R071 Chest pain on breathing: Secondary | ICD-10-CM | POA: Insufficient documentation

## 2013-02-04 DIAGNOSIS — F17200 Nicotine dependence, unspecified, uncomplicated: Secondary | ICD-10-CM | POA: Insufficient documentation

## 2013-02-04 DIAGNOSIS — Z8719 Personal history of other diseases of the digestive system: Secondary | ICD-10-CM | POA: Insufficient documentation

## 2013-02-04 DIAGNOSIS — Z8659 Personal history of other mental and behavioral disorders: Secondary | ICD-10-CM | POA: Insufficient documentation

## 2013-02-04 DIAGNOSIS — M542 Cervicalgia: Secondary | ICD-10-CM | POA: Insufficient documentation

## 2013-02-04 DIAGNOSIS — K219 Gastro-esophageal reflux disease without esophagitis: Secondary | ICD-10-CM | POA: Insufficient documentation

## 2013-02-04 LAB — COMPREHENSIVE METABOLIC PANEL
AST: 21 U/L (ref 0–37)
Albumin: 3.7 g/dL (ref 3.5–5.2)
BUN: 13 mg/dL (ref 6–23)
Calcium: 9 mg/dL (ref 8.4–10.5)
Creatinine, Ser: 0.9 mg/dL (ref 0.50–1.10)

## 2013-02-04 LAB — POCT I-STAT TROPONIN I: Troponin i, poc: 0 ng/mL (ref 0.00–0.08)

## 2013-02-04 LAB — CBC
MCH: 30.8 pg (ref 26.0–34.0)
MCHC: 33.8 g/dL (ref 30.0–36.0)
MCV: 91 fL (ref 78.0–100.0)
Platelets: 215 10*3/uL (ref 150–400)
RDW: 13.2 % (ref 11.5–15.5)

## 2013-02-04 MED ORDER — DIPHENHYDRAMINE HCL 25 MG PO CAPS
25.0000 mg | ORAL_CAPSULE | Freq: Once | ORAL | Status: AC
  Administered 2013-02-04: 25 mg via ORAL
  Filled 2013-02-04: qty 1

## 2013-02-04 MED ORDER — TRAMADOL HCL 50 MG PO TABS: 50.0000 mg | ORAL_TABLET | Freq: Four times a day (QID) | ORAL | Status: DC | PRN

## 2013-02-04 MED ORDER — TRAMADOL HCL 50 MG PO TABS
50.0000 mg | ORAL_TABLET | Freq: Once | ORAL | Status: AC
  Administered 2013-02-04: 50 mg via ORAL
  Filled 2013-02-04: qty 1

## 2013-02-04 NOTE — ED Notes (Signed)
Pt back from X-ray.  
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
## 2013-02-04 NOTE — ED Notes (Signed)
Pt states she just found out her daughter is having surgery and now she is having sternal chest pain that radiates to her back.  States she has had this kind of pain before and was dx with "chest wall pain".

## 2013-02-04 NOTE — ED Notes (Signed)
Pt given discharge paperwork; pt verbalized understanding of discharge; no additional questions by pt; e-signature obtained; 

## 2013-02-04 NOTE — ED Notes (Signed)
Pt returned from xray. Pt back on monitor.

## 2013-02-24 ENCOUNTER — Other Ambulatory Visit (HOSPITAL_COMMUNITY): Payer: Self-pay | Admitting: Obstetrics

## 2013-02-24 DIAGNOSIS — Z1231 Encounter for screening mammogram for malignant neoplasm of breast: Secondary | ICD-10-CM

## 2013-03-03 ENCOUNTER — Ambulatory Visit (HOSPITAL_COMMUNITY): Payer: Medicaid Other | Attending: Obstetrics

## 2013-03-22 ENCOUNTER — Ambulatory Visit (HOSPITAL_COMMUNITY)
Admission: RE | Admit: 2013-03-22 | Discharge: 2013-03-22 | Disposition: A | Discharge status: Home / Self Care | Payer: Medicaid Other | Source: Ambulatory Visit | Attending: Obstetrics | Admitting: Obstetrics

## 2013-03-22 DIAGNOSIS — Z1231 Encounter for screening mammogram for malignant neoplasm of breast: Secondary | ICD-10-CM | POA: Insufficient documentation

## 2013-03-23 ENCOUNTER — Emergency Department (HOSPITAL_COMMUNITY)
Admission: EM | Admit: 2013-03-23 | Discharge: 2013-03-23 | Disposition: A | Discharge status: Home / Self Care | Payer: Medicaid Other | Attending: Emergency Medicine | Admitting: Emergency Medicine

## 2013-03-23 ENCOUNTER — Encounter (HOSPITAL_COMMUNITY): Payer: Self-pay | Admitting: Emergency Medicine

## 2013-03-23 ENCOUNTER — Emergency Department (HOSPITAL_COMMUNITY): Payer: Medicaid Other

## 2013-03-23 DIAGNOSIS — R079 Chest pain, unspecified: Secondary | ICD-10-CM | POA: Insufficient documentation

## 2013-03-23 DIAGNOSIS — M549 Dorsalgia, unspecified: Secondary | ICD-10-CM | POA: Insufficient documentation

## 2013-03-23 DIAGNOSIS — Z8719 Personal history of other diseases of the digestive system: Secondary | ICD-10-CM | POA: Insufficient documentation

## 2013-03-23 DIAGNOSIS — J069 Acute upper respiratory infection, unspecified: Secondary | ICD-10-CM | POA: Insufficient documentation

## 2013-03-23 DIAGNOSIS — Z79899 Other long term (current) drug therapy: Secondary | ICD-10-CM | POA: Insufficient documentation

## 2013-03-23 DIAGNOSIS — J029 Acute pharyngitis, unspecified: Secondary | ICD-10-CM | POA: Insufficient documentation

## 2013-03-23 DIAGNOSIS — M542 Cervicalgia: Secondary | ICD-10-CM | POA: Insufficient documentation

## 2013-03-23 DIAGNOSIS — B9789 Other viral agents as the cause of diseases classified elsewhere: Secondary | ICD-10-CM

## 2013-03-23 DIAGNOSIS — G8929 Other chronic pain: Secondary | ICD-10-CM | POA: Insufficient documentation

## 2013-03-23 DIAGNOSIS — J45909 Unspecified asthma, uncomplicated: Secondary | ICD-10-CM | POA: Insufficient documentation

## 2013-03-23 DIAGNOSIS — R6889 Other general symptoms and signs: Secondary | ICD-10-CM | POA: Insufficient documentation

## 2013-03-23 DIAGNOSIS — R5381 Other malaise: Secondary | ICD-10-CM | POA: Insufficient documentation

## 2013-03-23 DIAGNOSIS — J988 Other specified respiratory disorders: Secondary | ICD-10-CM

## 2013-03-23 DIAGNOSIS — Z8679 Personal history of other diseases of the circulatory system: Secondary | ICD-10-CM | POA: Insufficient documentation

## 2013-03-23 DIAGNOSIS — F17200 Nicotine dependence, unspecified, uncomplicated: Secondary | ICD-10-CM | POA: Insufficient documentation

## 2013-03-23 DIAGNOSIS — Z8659 Personal history of other mental and behavioral disorders: Secondary | ICD-10-CM | POA: Insufficient documentation

## 2013-03-23 DIAGNOSIS — Z8739 Personal history of other diseases of the musculoskeletal system and connective tissue: Secondary | ICD-10-CM | POA: Insufficient documentation

## 2013-03-23 DIAGNOSIS — R197 Diarrhea, unspecified: Secondary | ICD-10-CM | POA: Insufficient documentation

## 2013-03-23 MED ORDER — HYDROCOD POLST-CHLORPHEN POLST 10-8 MG/5ML PO LQCR: 5.0000 mL | Freq: Two times a day (BID) | ORAL | Status: DC | PRN

## 2013-03-23 MED ORDER — ALBUTEROL SULFATE HFA 108 (90 BASE) MCG/ACT IN AERS: 2.0000 | INHALATION_SPRAY | RESPIRATORY_TRACT | Status: DC | PRN
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
## 2013-03-23 NOTE — ED Notes (Signed)
Pt c/o generalized weakness and body aches with URI sx with productive cough and pain with cough x 3 days

## 2013-03-24 NOTE — ED Provider Notes (Signed)
Medical screening examination/treatment/procedure(s) were performed by non-physician practitioner and as supervising physician I was immediately available for consultation/collaboration.   Ellarae Nevitt L Naw Lasala, MD 03/24/13 2247 

## 2013-04-05 ENCOUNTER — Emergency Department (HOSPITAL_COMMUNITY)
Admission: EM | Admit: 2013-04-05 | Discharge: 2013-04-05 | Disposition: A | Discharge status: Home / Self Care | Payer: Medicaid Other | Attending: Emergency Medicine | Admitting: Emergency Medicine

## 2013-04-05 ENCOUNTER — Encounter (HOSPITAL_COMMUNITY): Payer: Self-pay

## 2013-04-05 DIAGNOSIS — J45909 Unspecified asthma, uncomplicated: Secondary | ICD-10-CM | POA: Insufficient documentation

## 2013-04-05 DIAGNOSIS — F17200 Nicotine dependence, unspecified, uncomplicated: Secondary | ICD-10-CM | POA: Insufficient documentation

## 2013-04-05 DIAGNOSIS — Z8739 Personal history of other diseases of the musculoskeletal system and connective tissue: Secondary | ICD-10-CM | POA: Insufficient documentation

## 2013-04-05 DIAGNOSIS — Z8659 Personal history of other mental and behavioral disorders: Secondary | ICD-10-CM | POA: Insufficient documentation

## 2013-04-05 DIAGNOSIS — K529 Noninfective gastroenteritis and colitis, unspecified: Secondary | ICD-10-CM

## 2013-04-05 DIAGNOSIS — K219 Gastro-esophageal reflux disease without esophagitis: Secondary | ICD-10-CM | POA: Insufficient documentation

## 2013-04-05 DIAGNOSIS — K625 Hemorrhage of anus and rectum: Secondary | ICD-10-CM | POA: Insufficient documentation

## 2013-04-05 DIAGNOSIS — R112 Nausea with vomiting, unspecified: Secondary | ICD-10-CM | POA: Insufficient documentation

## 2013-04-05 DIAGNOSIS — R1084 Generalized abdominal pain: Secondary | ICD-10-CM | POA: Insufficient documentation

## 2013-04-05 DIAGNOSIS — Z79899 Other long term (current) drug therapy: Secondary | ICD-10-CM | POA: Insufficient documentation

## 2013-04-05 DIAGNOSIS — Z9889 Other specified postprocedural states: Secondary | ICD-10-CM | POA: Insufficient documentation

## 2013-04-05 DIAGNOSIS — K5289 Other specified noninfective gastroenteritis and colitis: Secondary | ICD-10-CM | POA: Insufficient documentation

## 2013-04-05 DIAGNOSIS — G8929 Other chronic pain: Secondary | ICD-10-CM | POA: Insufficient documentation

## 2013-04-05 DIAGNOSIS — Z3202 Encounter for pregnancy test, result negative: Secondary | ICD-10-CM | POA: Insufficient documentation

## 2013-04-05 DIAGNOSIS — Z8679 Personal history of other diseases of the circulatory system: Secondary | ICD-10-CM | POA: Insufficient documentation

## 2013-04-05 DIAGNOSIS — Z8719 Personal history of other diseases of the digestive system: Secondary | ICD-10-CM | POA: Insufficient documentation

## 2013-04-05 LAB — COMPREHENSIVE METABOLIC PANEL
ALT: 27 U/L (ref 0–35)
AST: 25 U/L (ref 0–37)
Albumin: 3.9 g/dL (ref 3.5–5.2)
Alkaline Phosphatase: 77 U/L (ref 39–117)
BUN: 8 mg/dL (ref 6–23)
CO2: 24 mEq/L (ref 19–32)
Calcium: 9.1 mg/dL (ref 8.4–10.5)
Chloride: 103 mEq/L (ref 96–112)
Creatinine, Ser: 0.98 mg/dL (ref 0.50–1.10)
GFR calc Af Amer: 80 mL/min — ABNORMAL LOW (ref >90)
GFR calc non Af Amer: 69 mL/min — ABNORMAL LOW (ref >90)
Glucose, Bld: 105 mg/dL — ABNORMAL HIGH (ref 70–99)
Potassium: 3.8 mEq/L (ref 3.5–5.1)
Sodium: 138 mEq/L (ref 135–145)
Total Bilirubin: 0.2 mg/dL — ABNORMAL LOW (ref 0.3–1.2)
Total Protein: 7 g/dL (ref 6.0–8.3)

## 2013-04-05 LAB — URINALYSIS, MICROSCOPIC ONLY
Bilirubin Urine: NEGATIVE
Glucose, UA: NEGATIVE mg/dL
Ketones, ur: NEGATIVE mg/dL
Protein, ur: NEGATIVE mg/dL
pH: 6.5 (ref 5.0–8.0)

## 2013-04-05 LAB — CBC WITH DIFFERENTIAL/PLATELET
Basophils Absolute: 0 10*3/uL (ref 0.0–0.1)
Basophils Relative: 0 % (ref 0–1)
Eosinophils Absolute: 0.1 10*3/uL (ref 0.0–0.7)
Eosinophils Relative: 1 % (ref 0–5)
HCT: 44.8 % (ref 36.0–46.0)
Hemoglobin: 15.2 g/dL — ABNORMAL HIGH (ref 12.0–15.0)
Lymphocytes Relative: 23 % (ref 12–46)
Lymphs Abs: 2.4 10*3/uL (ref 0.7–4.0)
MCH: 30.9 pg (ref 26.0–34.0)
MCHC: 33.9 g/dL (ref 30.0–36.0)
MCV: 91.1 fL (ref 78.0–100.0)
Monocytes Absolute: 0.9 10*3/uL (ref 0.1–1.0)
Monocytes Relative: 9 % (ref 3–12)
Neutro Abs: 6.9 10*3/uL (ref 1.7–7.7)
Neutrophils Relative %: 68 % (ref 43–77)
Platelets: 214 10*3/uL (ref 150–400)
RBC: 4.92 MIL/uL (ref 3.87–5.11)
RDW: 13 % (ref 11.5–15.5)
WBC: 10.2 10*3/uL (ref 4.0–10.5)

## 2013-04-05 LAB — OCCULT BLOOD, POC DEVICE: Fecal Occult Bld: NEGATIVE

## 2013-04-05 LAB — LIPASE, BLOOD: Lipase: 16 U/L (ref 11–59)

## 2013-04-05 LAB — POCT PREGNANCY, URINE: Preg Test, Ur: NEGATIVE

## 2013-04-05 MED ORDER — ONDANSETRON HCL 4 MG/2ML IJ SOLN
4.0000 mg | Freq: Once | INTRAMUSCULAR | Status: AC
  Administered 2013-04-05: 4 mg via INTRAVENOUS
  Filled 2013-04-05: qty 2

## 2013-04-05 MED ORDER — MORPHINE SULFATE 4 MG/ML IJ SOLN
4.0000 mg | Freq: Once | INTRAMUSCULAR | Status: AC
  Administered 2013-04-05: 4 mg via INTRAVENOUS
  Filled 2013-04-05: qty 1

## 2013-04-05 MED ORDER — HYDROCODONE-ACETAMINOPHEN 5-325 MG PO TABS: 2.0000 | ORAL_TABLET | ORAL | Status: DC | PRN

## 2013-04-05 MED ORDER — PROMETHAZINE HCL 25 MG PO TABS: 25.0000 mg | ORAL_TABLET | Freq: Four times a day (QID) | ORAL | Status: DC | PRN

## 2013-04-05 MED ORDER — PROMETHAZINE HCL 25 MG/ML IJ SOLN
25.0000 mg | INTRAMUSCULAR | Status: AC
Start: 2013-04-05 — End: 2013-04-05
  Administered 2013-04-05: 25 mg via INTRAVENOUS
  Filled 2013-04-05: qty 1

## 2013-04-05 NOTE — ED Provider Notes (Signed)
Medical screening examination/treatment/procedure(s) were performed by non-physician practitioner and as supervising physician I was immediately available for consultation/collaboration.    Myalynn Lingle R Lula Michaux, MD 04/05/13 1622 

## 2013-04-05 NOTE — ED Notes (Signed)
Pt c/o diarrhea since 8pm with nausea/vomiting, c/o rectum bleeding

## 2013-04-05 NOTE — ED Notes (Signed)
[id number]<AV> Expected date:<BR> Expected time:<BR> Means of arrival:<BR> Comments:<BR> 42yo-f- n/v/d

## 2013-04-05 NOTE — ED Notes (Signed)
Pt. Is not able to use the restroom at this, but she is aware that we need a urine specimen.

## 2013-04-05 NOTE — ED Notes (Signed)
Pt states that last night at 8pm she started having diarrhea.  Pt states that she has had dark red blood in her stool this am three times and c/o lower abd pain from naval down and back pain.
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
## 2013-05-11 ENCOUNTER — Emergency Department (HOSPITAL_COMMUNITY): Payer: Medicaid Other

## 2013-05-11 ENCOUNTER — Emergency Department (HOSPITAL_COMMUNITY)
Admission: EM | Admit: 2013-05-11 | Discharge: 2013-05-11 | Disposition: A | Discharge status: Home / Self Care | Payer: Medicaid Other | Attending: Emergency Medicine | Admitting: Emergency Medicine

## 2013-05-11 ENCOUNTER — Encounter (HOSPITAL_COMMUNITY): Payer: Self-pay | Admitting: Emergency Medicine

## 2013-05-11 DIAGNOSIS — F17200 Nicotine dependence, unspecified, uncomplicated: Secondary | ICD-10-CM | POA: Insufficient documentation

## 2013-05-11 DIAGNOSIS — Z8739 Personal history of other diseases of the musculoskeletal system and connective tissue: Secondary | ICD-10-CM | POA: Insufficient documentation

## 2013-05-11 DIAGNOSIS — Z720 Tobacco use: Secondary | ICD-10-CM

## 2013-05-11 DIAGNOSIS — R059 Cough, unspecified: Secondary | ICD-10-CM | POA: Insufficient documentation

## 2013-05-11 DIAGNOSIS — J45909 Unspecified asthma, uncomplicated: Secondary | ICD-10-CM | POA: Insufficient documentation

## 2013-05-11 DIAGNOSIS — R05 Cough: Secondary | ICD-10-CM | POA: Insufficient documentation

## 2013-05-11 DIAGNOSIS — R197 Diarrhea, unspecified: Secondary | ICD-10-CM | POA: Insufficient documentation

## 2013-05-11 DIAGNOSIS — J40 Bronchitis, not specified as acute or chronic: Secondary | ICD-10-CM

## 2013-05-11 DIAGNOSIS — K219 Gastro-esophageal reflux disease without esophagitis: Secondary | ICD-10-CM | POA: Insufficient documentation

## 2013-05-11 DIAGNOSIS — Z8679 Personal history of other diseases of the circulatory system: Secondary | ICD-10-CM | POA: Insufficient documentation

## 2013-05-11 DIAGNOSIS — Z79899 Other long term (current) drug therapy: Secondary | ICD-10-CM | POA: Insufficient documentation

## 2013-05-11 DIAGNOSIS — J209 Acute bronchitis, unspecified: Secondary | ICD-10-CM | POA: Insufficient documentation

## 2013-05-11 DIAGNOSIS — Z8659 Personal history of other mental and behavioral disorders: Secondary | ICD-10-CM | POA: Insufficient documentation

## 2013-05-11 DIAGNOSIS — J3489 Other specified disorders of nose and nasal sinuses: Secondary | ICD-10-CM | POA: Insufficient documentation

## 2013-05-11 DIAGNOSIS — J329 Chronic sinusitis, unspecified: Secondary | ICD-10-CM

## 2013-05-11 DIAGNOSIS — Z8711 Personal history of peptic ulcer disease: Secondary | ICD-10-CM | POA: Insufficient documentation

## 2013-05-11 MED ORDER — ALBUTEROL SULFATE (5 MG/ML) 0.5% IN NEBU
5.0000 mg | INHALATION_SOLUTION | Freq: Once | RESPIRATORY_TRACT | Status: AC
  Administered 2013-05-11: 5 mg via RESPIRATORY_TRACT
  Filled 2013-05-11: qty 1

## 2013-05-11 MED ORDER — IPRATROPIUM BROMIDE 0.02 % IN SOLN
0.5000 mg | Freq: Once | RESPIRATORY_TRACT | Status: AC
  Administered 2013-05-11: 0.5 mg via RESPIRATORY_TRACT
  Filled 2013-05-11: qty 2.5

## 2013-05-11 MED ORDER — PREDNISONE 20 MG PO TABS
60.0000 mg | ORAL_TABLET | Freq: Once | ORAL | Status: AC
  Administered 2013-05-11: 60 mg via ORAL
  Filled 2013-05-11: qty 3

## 2013-05-11 MED ORDER — ALBUTEROL SULFATE (2.5 MG/3ML) 0.083% IN NEBU: 2.5000 mg | INHALATION_SOLUTION | Freq: Four times a day (QID) | RESPIRATORY_TRACT | Status: DC | PRN

## 2013-05-11 MED ORDER — AMOXICILLIN-POT CLAVULANATE 875-125 MG PO TABS: 1.0000 | ORAL_TABLET | Freq: Two times a day (BID) | ORAL | Status: DC

## 2013-05-11 MED ORDER — PREDNISONE 10 MG PO TABS: ORAL_TABLET | ORAL | Status: DC

## 2013-05-11 MED ORDER — HYDROCODONE-ACETAMINOPHEN 5-325 MG PO TABS
2.0000 | ORAL_TABLET | Freq: Once | ORAL | Status: AC
  Administered 2013-05-11: 2 via ORAL
  Filled 2013-05-11: qty 2

## 2013-05-11 NOTE — ED Notes (Signed)
Pt c/o CP, cough, aches and diarrhea x 5 days

## 2013-05-11 NOTE — ED Notes (Signed)
Reports cough since last week, productive.  States that the sputum has been greenish yellow.  Pt reports CP on palpation and when coughing and when deep breathing.  States that she has had body aches since last week.
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
## 2013-06-21 ENCOUNTER — Emergency Department (HOSPITAL_COMMUNITY)
Admission: EM | Admit: 2013-06-21 | Discharge: 2013-06-21 | Disposition: A | Discharge status: Home / Self Care | Payer: Medicaid Other | Attending: Emergency Medicine | Admitting: Emergency Medicine

## 2013-06-21 ENCOUNTER — Encounter (HOSPITAL_COMMUNITY): Payer: Self-pay | Admitting: Emergency Medicine

## 2013-06-21 DIAGNOSIS — Z8719 Personal history of other diseases of the digestive system: Secondary | ICD-10-CM | POA: Insufficient documentation

## 2013-06-21 DIAGNOSIS — M549 Dorsalgia, unspecified: Secondary | ICD-10-CM | POA: Insufficient documentation

## 2013-06-21 DIAGNOSIS — K219 Gastro-esophageal reflux disease without esophagitis: Secondary | ICD-10-CM | POA: Insufficient documentation

## 2013-06-21 DIAGNOSIS — G8929 Other chronic pain: Secondary | ICD-10-CM | POA: Insufficient documentation

## 2013-06-21 DIAGNOSIS — X500XXA Overexertion from strenuous movement or load, initial encounter: Secondary | ICD-10-CM | POA: Insufficient documentation

## 2013-06-21 DIAGNOSIS — M129 Arthropathy, unspecified: Secondary | ICD-10-CM | POA: Insufficient documentation

## 2013-06-21 DIAGNOSIS — R209 Unspecified disturbances of skin sensation: Secondary | ICD-10-CM | POA: Insufficient documentation

## 2013-06-21 DIAGNOSIS — Z8679 Personal history of other diseases of the circulatory system: Secondary | ICD-10-CM | POA: Insufficient documentation

## 2013-06-21 DIAGNOSIS — Y92009 Unspecified place in unspecified non-institutional (private) residence as the place of occurrence of the external cause: Secondary | ICD-10-CM | POA: Insufficient documentation

## 2013-06-21 DIAGNOSIS — M542 Cervicalgia: Secondary | ICD-10-CM

## 2013-06-21 DIAGNOSIS — F17200 Nicotine dependence, unspecified, uncomplicated: Secondary | ICD-10-CM | POA: Insufficient documentation

## 2013-06-21 DIAGNOSIS — Y998 Other external cause status: Secondary | ICD-10-CM | POA: Insufficient documentation

## 2013-06-21 DIAGNOSIS — J45909 Unspecified asthma, uncomplicated: Secondary | ICD-10-CM | POA: Insufficient documentation

## 2013-06-21 DIAGNOSIS — M5412 Radiculopathy, cervical region: Secondary | ICD-10-CM | POA: Insufficient documentation

## 2013-06-21 DIAGNOSIS — Z888 Allergy status to other drugs, medicaments and biological substances status: Secondary | ICD-10-CM | POA: Insufficient documentation

## 2013-06-21 DIAGNOSIS — M62838 Other muscle spasm: Secondary | ICD-10-CM | POA: Insufficient documentation

## 2013-06-21 DIAGNOSIS — Z79899 Other long term (current) drug therapy: Secondary | ICD-10-CM | POA: Insufficient documentation

## 2013-06-21 MED ORDER — HYDROMORPHONE HCL PF 1 MG/ML IJ SOLN
1.0000 mg | Freq: Once | INTRAMUSCULAR | Status: AC
  Administered 2013-06-21: 1 mg via INTRAMUSCULAR
  Filled 2013-06-21: qty 1

## 2013-06-21 MED ORDER — CYCLOBENZAPRINE HCL 10 MG PO TABS
5.0000 mg | ORAL_TABLET | Freq: Once | ORAL | Status: AC
  Administered 2013-06-21: 5 mg via ORAL
  Filled 2013-06-21: qty 1

## 2013-06-21 MED ORDER — HYDROCODONE-ACETAMINOPHEN 5-325 MG PO TABS: 1.0000 | ORAL_TABLET | ORAL | Status: DC | PRN

## 2013-06-21 MED ORDER — CYCLOBENZAPRINE HCL 10 MG PO TABS: 10.0000 mg | ORAL_TABLET | Freq: Two times a day (BID) | ORAL | Status: DC | PRN
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
## 2013-07-11 ENCOUNTER — Encounter (HOSPITAL_COMMUNITY): Payer: Self-pay | Admitting: Family

## 2013-07-11 ENCOUNTER — Inpatient Hospital Stay (HOSPITAL_COMMUNITY)
Admission: AD | Admit: 2013-07-11 | Discharge: 2013-07-11 | Disposition: A | Discharge status: Home / Self Care | Payer: Medicaid Other | Source: Ambulatory Visit | Attending: Obstetrics | Admitting: Obstetrics

## 2013-07-11 ENCOUNTER — Inpatient Hospital Stay (HOSPITAL_COMMUNITY): Payer: Medicaid Other

## 2013-07-11 DIAGNOSIS — A499 Bacterial infection, unspecified: Secondary | ICD-10-CM | POA: Insufficient documentation

## 2013-07-11 DIAGNOSIS — N760 Acute vaginitis: Secondary | ICD-10-CM | POA: Insufficient documentation

## 2013-07-11 DIAGNOSIS — K5900 Constipation, unspecified: Secondary | ICD-10-CM

## 2013-07-11 DIAGNOSIS — IMO0002 Reserved for concepts with insufficient information to code with codable children: Secondary | ICD-10-CM | POA: Insufficient documentation

## 2013-07-11 DIAGNOSIS — B9689 Other specified bacterial agents as the cause of diseases classified elsewhere: Secondary | ICD-10-CM | POA: Insufficient documentation

## 2013-07-11 DIAGNOSIS — R1031 Right lower quadrant pain: Secondary | ICD-10-CM | POA: Insufficient documentation

## 2013-07-11 HISTORY — DX: Chlamydial infection, unspecified: A74.9

## 2013-07-11 LAB — CBC
HCT: 43.7 % (ref 36.0–46.0)
Hemoglobin: 15 g/dL (ref 12.0–15.0)
MCH: 31.1 pg (ref 26.0–34.0)
MCHC: 34.3 g/dL (ref 30.0–36.0)
MCV: 90.5 fL (ref 78.0–100.0)
Platelets: 215 10*3/uL (ref 150–400)
RBC: 4.83 MIL/uL (ref 3.87–5.11)
RDW: 13.7 % (ref 11.5–15.5)
WBC: 9.5 10*3/uL (ref 4.0–10.5)

## 2013-07-11 LAB — URINALYSIS, ROUTINE W REFLEX MICROSCOPIC
Glucose, UA: NEGATIVE mg/dL
Leukocytes, UA: NEGATIVE
Nitrite: NEGATIVE
Protein, ur: NEGATIVE mg/dL
pH: 6 (ref 5.0–8.0)

## 2013-07-11 LAB — WET PREP, GENITAL
Trich, Wet Prep: NONE SEEN
Yeast Wet Prep HPF POC: NONE SEEN

## 2013-07-11 LAB — URINE MICROSCOPIC-ADD ON

## 2013-07-11 LAB — POCT PREGNANCY, URINE: Preg Test, Ur: NEGATIVE

## 2013-07-11 MED ORDER — KETOROLAC TROMETHAMINE 60 MG/2ML IM SOLN
60.0000 mg | Freq: Once | INTRAMUSCULAR | Status: AC
  Administered 2013-07-11: 60 mg via INTRAMUSCULAR
  Filled 2013-07-11: qty 2

## 2013-07-11 MED ORDER — METRONIDAZOLE 500 MG PO TABS: 500.0000 mg | ORAL_TABLET | Freq: Two times a day (BID) | ORAL | Status: DC

## 2013-07-11 NOTE — MAU Note (Signed)
Patient presents to MAU with c/o intermittent LRQ abdominal cramping x 1 month; reports pain is alleviated by pressure at site.

## 2013-07-11 NOTE — MAU Note (Signed)
Pt reports she stared having RLQ on and off for the past month. Stated she had an abnormal pap smear a few months ago was told she needed further testing to r/o ovarian CA the patient has not gone back for testing.  Pt had an unterine ablation 5 years ago.
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
## 2013-07-12 LAB — GC/CHLAMYDIA PROBE AMP
CT Probe RNA: NEGATIVE
GC Probe RNA: NEGATIVE

## 2013-07-13 ENCOUNTER — Emergency Department (HOSPITAL_COMMUNITY)
Admission: EM | Admit: 2013-07-13 | Discharge: 2013-07-13 | Disposition: A | Discharge status: Home / Self Care | Payer: Medicaid Other | Attending: Emergency Medicine | Admitting: Emergency Medicine

## 2013-07-13 ENCOUNTER — Encounter (HOSPITAL_COMMUNITY): Payer: Self-pay | Admitting: Family Medicine

## 2013-07-13 DIAGNOSIS — Z8659 Personal history of other mental and behavioral disorders: Secondary | ICD-10-CM | POA: Insufficient documentation

## 2013-07-13 DIAGNOSIS — R110 Nausea: Secondary | ICD-10-CM | POA: Insufficient documentation

## 2013-07-13 DIAGNOSIS — Z8619 Personal history of other infectious and parasitic diseases: Secondary | ICD-10-CM | POA: Insufficient documentation

## 2013-07-13 DIAGNOSIS — J45909 Unspecified asthma, uncomplicated: Secondary | ICD-10-CM | POA: Insufficient documentation

## 2013-07-13 DIAGNOSIS — G8929 Other chronic pain: Secondary | ICD-10-CM | POA: Insufficient documentation

## 2013-07-13 DIAGNOSIS — R1031 Right lower quadrant pain: Secondary | ICD-10-CM | POA: Insufficient documentation

## 2013-07-13 DIAGNOSIS — Z8739 Personal history of other diseases of the musculoskeletal system and connective tissue: Secondary | ICD-10-CM | POA: Insufficient documentation

## 2013-07-13 DIAGNOSIS — K219 Gastro-esophageal reflux disease without esophagitis: Secondary | ICD-10-CM | POA: Insufficient documentation

## 2013-07-13 DIAGNOSIS — M549 Dorsalgia, unspecified: Secondary | ICD-10-CM | POA: Insufficient documentation

## 2013-07-13 DIAGNOSIS — F17200 Nicotine dependence, unspecified, uncomplicated: Secondary | ICD-10-CM | POA: Insufficient documentation

## 2013-07-13 DIAGNOSIS — R630 Anorexia: Secondary | ICD-10-CM | POA: Insufficient documentation

## 2013-07-13 DIAGNOSIS — M542 Cervicalgia: Secondary | ICD-10-CM | POA: Insufficient documentation

## 2013-07-13 DIAGNOSIS — Z8719 Personal history of other diseases of the digestive system: Secondary | ICD-10-CM | POA: Insufficient documentation

## 2013-07-13 DIAGNOSIS — G43909 Migraine, unspecified, not intractable, without status migrainosus: Secondary | ICD-10-CM | POA: Insufficient documentation

## 2013-07-13 LAB — URINALYSIS, ROUTINE W REFLEX MICROSCOPIC
Nitrite: NEGATIVE
Protein, ur: NEGATIVE mg/dL
Specific Gravity, Urine: 1.017 (ref 1.005–1.030)
Urobilinogen, UA: 0.2 mg/dL (ref 0.0–1.0)

## 2013-07-13 LAB — CBC WITH DIFFERENTIAL/PLATELET
Basophils Absolute: 0 10*3/uL (ref 0.0–0.1)
Basophils Relative: 0 % (ref 0–1)
Eosinophils Absolute: 0.1 10*3/uL (ref 0.0–0.7)
Eosinophils Relative: 1 % (ref 0–5)
MCH: 31.7 pg (ref 26.0–34.0)
MCHC: 35.1 g/dL (ref 30.0–36.0)
MCV: 90.4 fL (ref 78.0–100.0)
Neutrophils Relative %: 56 % (ref 43–77)
Platelets: 185 10*3/uL (ref 150–400)
RBC: 4.7 MIL/uL (ref 3.87–5.11)
RDW: 13.6 % (ref 11.5–15.5)

## 2013-07-13 LAB — BASIC METABOLIC PANEL
Calcium: 8.9 mg/dL (ref 8.4–10.5)
GFR calc Af Amer: 69 mL/min — ABNORMAL LOW (ref >90)
GFR calc non Af Amer: 59 mL/min — ABNORMAL LOW (ref >90)
Glucose, Bld: 111 mg/dL — ABNORMAL HIGH (ref 70–99)
Potassium: 4.1 mEq/L (ref 3.5–5.1)
Sodium: 138 mEq/L (ref 135–145)

## 2013-07-13 LAB — URINE MICROSCOPIC-ADD ON

## 2013-07-13 MED ORDER — ACETAMINOPHEN 325 MG PO TABS
650.0000 mg | ORAL_TABLET | Freq: Once | ORAL | Status: AC
  Administered 2013-07-13: 650 mg via ORAL
  Filled 2013-07-13: qty 2

## 2013-07-13 MED ORDER — OXYCODONE-ACETAMINOPHEN 5-325 MG PO TABS
1.0000 | ORAL_TABLET | Freq: Once | ORAL | Status: AC
  Administered 2013-07-13: 1 via ORAL
  Filled 2013-07-13: qty 1

## 2013-07-13 MED ORDER — HYDROCODONE-ACETAMINOPHEN 5-500 MG PO TABS: 1.0000 | ORAL_TABLET | Freq: Four times a day (QID) | ORAL | Status: DC | PRN

## 2013-07-13 NOTE — ED Notes (Signed)
Per pt sts intermittent abdominal cramping x 1 month. sts RLQ pain. [facility]. sts some diarrhea.
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
## 2013-07-14 NOTE — ED Provider Notes (Signed)
Medical screening examination/treatment/procedure(s) were performed by non-physician practitioner and as supervising physician I was immediately available for consultation/collaboration.  Manasa Spease T Ollis Daudelin, MD 07/14/13 1533 

## 2013-07-19 ENCOUNTER — Other Ambulatory Visit (HOSPITAL_COMMUNITY): Payer: Self-pay | Admitting: Interventional Radiology

## 2013-07-19 ENCOUNTER — Encounter: Payer: Self-pay | Admitting: Obstetrics

## 2013-07-19 ENCOUNTER — Ambulatory Visit (INDEPENDENT_AMBULATORY_CARE_PROVIDER_SITE_OTHER): Payer: Medicaid Other | Admitting: Obstetrics

## 2013-07-19 VITALS — BP 134/85 | HR 64 | Temp 98.1°F | Wt 227.0 lb

## 2013-07-19 DIAGNOSIS — R1031 Right lower quadrant pain: Secondary | ICD-10-CM

## 2013-07-19 DIAGNOSIS — N949 Unspecified condition associated with female genital organs and menstrual cycle: Secondary | ICD-10-CM

## 2013-07-19 LAB — POCT URINALYSIS DIPSTICK
Blood, UA: NEGATIVE
Ketones, UA: NEGATIVE
Protein, UA: NEGATIVE
Spec Grav, UA: 1.01
Urobilinogen, UA: NEGATIVE

## 2013-07-19 MED ORDER — OXYCODONE-ACETAMINOPHEN 10-325 MG PO TABS: 1.0000 | ORAL_TABLET | Freq: Four times a day (QID) | ORAL | Status: DC | PRN

## 2013-07-19 NOTE — Progress Notes (Signed)
Subjective:     Sheretta Grumbine is a 42 y.o. female here for a routine exam.  Current complaints: pain due to fibroid. Pt states she has recently been in the emergency room for the pain. Pt states the pain is on her lower right side. Pt states she is taking Vicodin and it is not helping her pain.    Personal health questionnaire reviewed: yes.   Gynecologic History No LMP recorded. Patient has had an ablation. Contraception: none Last Pap: 2013. Results were: unsure Last mammogram: 2014. Results were: normal  Obstetric History OB History   Grav Para Term Preterm Abortions TAB SAB Ect Mult Living                   The following portions of the patient's history were reviewed and updated as appropriate: allergies, current medications, past family history, past medical history, past social history, past surgical history and problem list.  Review of Systems Pertinent items are noted in HPI.    Objective:    General appearance: alert and no distress Abdomen: abnormal findings:  obese and Pain in right inguinal area, no bulging with standing and valsalva maneuver. Pelvic: cervix normal in appearance, external genitalia normal, no adnexal masses or tenderness, no cervical motion tenderness, uterus normal size, shape, and consistency and vagina normal without discharge    Assessment:    Right inguinal pain.  R/O inguinal hernia.   Plan:    Education reviewed: Hernias..    CT with contrast ordered.

## 2013-07-20 ENCOUNTER — Encounter: Payer: Self-pay | Admitting: Obstetrics

## 2013-07-20 DIAGNOSIS — R1031 Right lower quadrant pain: Secondary | ICD-10-CM | POA: Insufficient documentation

## 2013-07-21 ENCOUNTER — Ambulatory Visit (HOSPITAL_COMMUNITY)
Admission: RE | Admit: 2013-07-21 | Discharge: 2013-07-21 | Disposition: A | Discharge status: Home / Self Care | Payer: Medicaid Other | Source: Ambulatory Visit | Attending: Obstetrics | Admitting: Obstetrics

## 2013-07-21 DIAGNOSIS — R109 Unspecified abdominal pain: Secondary | ICD-10-CM | POA: Insufficient documentation

## 2013-07-21 DIAGNOSIS — N949 Unspecified condition associated with female genital organs and menstrual cycle: Secondary | ICD-10-CM | POA: Insufficient documentation

## 2013-07-21 MED ORDER — IOHEXOL 300 MG/ML  SOLN
100.0000 mL | Freq: Once | INTRAMUSCULAR | Status: AC | PRN
  Administered 2013-07-21: 100 mL via INTRAVENOUS

## 2013-07-27 ENCOUNTER — Ambulatory Visit (INDEPENDENT_AMBULATORY_CARE_PROVIDER_SITE_OTHER): Payer: Medicaid Other | Admitting: Obstetrics

## 2013-07-27 ENCOUNTER — Encounter: Payer: Self-pay | Admitting: Obstetrics

## 2013-07-27 VITALS — BP 118/83 | HR 75 | Temp 98.0°F | Wt 226.0 lb

## 2013-07-27 DIAGNOSIS — N949 Unspecified condition associated with female genital organs and menstrual cycle: Secondary | ICD-10-CM

## 2013-07-27 DIAGNOSIS — R102 Pelvic and perineal pain: Secondary | ICD-10-CM

## 2013-07-27 MED ORDER — HYDROMORPHONE HCL 4 MG PO TABS: 4.0000 mg | ORAL_TABLET | Freq: Four times a day (QID) | ORAL | Status: DC | PRN

## 2013-07-27 NOTE — Progress Notes (Signed)
Subjective:     Jennise Both is a 42 y.o. female here for results.  Current complaints: pain unchanged.  Personal health questionnaire reviewed: not asked.   Gynecologic History No LMP recorded. Patient has had an ablation. Contraception: none Per Dr. Clearance Coots: Ketorolac Tromethamine 60 mg/37mL given IM in Left GM Lot [id number] Exp. 12/2014 [place], lpn   The following portions of the patient's history were reviewed and updated as appropriate: allergies, current medications, past family history, past medical history, past social history, past surgical history and problem list.  Review of Systems Pertinent items are noted in HPI.     Objective:    No exam performed today, Consult only, for results..    Assessment:    Right inguinal pain, severe and persistent.  Ultrasound and CT of abdomen and pelvis WNL's.  Etiology of pain unknown.   Plan:      Will refer to General Surgery to R/O hernia.

## 2013-07-28 ENCOUNTER — Encounter: Payer: Self-pay | Admitting: Obstetrics

## 2013-08-03 ENCOUNTER — Emergency Department (HOSPITAL_COMMUNITY)
Admission: EM | Admit: 2013-08-03 | Discharge: 2013-08-03 | Disposition: A | Discharge status: Home / Self Care | Payer: Medicaid Other | Attending: Emergency Medicine | Admitting: Emergency Medicine

## 2013-08-03 ENCOUNTER — Emergency Department (HOSPITAL_COMMUNITY): Payer: Medicaid Other

## 2013-08-03 ENCOUNTER — Ambulatory Visit: Payer: No Typology Code available for payment source | Admitting: Obstetrics

## 2013-08-03 ENCOUNTER — Encounter (HOSPITAL_COMMUNITY): Payer: Self-pay | Admitting: *Deleted

## 2013-08-03 DIAGNOSIS — F17200 Nicotine dependence, unspecified, uncomplicated: Secondary | ICD-10-CM | POA: Insufficient documentation

## 2013-08-03 DIAGNOSIS — M542 Cervicalgia: Secondary | ICD-10-CM | POA: Insufficient documentation

## 2013-08-03 DIAGNOSIS — M549 Dorsalgia, unspecified: Secondary | ICD-10-CM | POA: Insufficient documentation

## 2013-08-03 DIAGNOSIS — R109 Unspecified abdominal pain: Secondary | ICD-10-CM

## 2013-08-03 DIAGNOSIS — Z8679 Personal history of other diseases of the circulatory system: Secondary | ICD-10-CM | POA: Insufficient documentation

## 2013-08-03 DIAGNOSIS — M129 Arthropathy, unspecified: Secondary | ICD-10-CM | POA: Insufficient documentation

## 2013-08-03 DIAGNOSIS — J45909 Unspecified asthma, uncomplicated: Secondary | ICD-10-CM | POA: Insufficient documentation

## 2013-08-03 DIAGNOSIS — Z87828 Personal history of other (healed) physical injury and trauma: Secondary | ICD-10-CM | POA: Insufficient documentation

## 2013-08-03 DIAGNOSIS — F4310 Post-traumatic stress disorder, unspecified: Secondary | ICD-10-CM | POA: Insufficient documentation

## 2013-08-03 DIAGNOSIS — Z3202 Encounter for pregnancy test, result negative: Secondary | ICD-10-CM | POA: Insufficient documentation

## 2013-08-03 DIAGNOSIS — Z8719 Personal history of other diseases of the digestive system: Secondary | ICD-10-CM | POA: Insufficient documentation

## 2013-08-03 DIAGNOSIS — Z79899 Other long term (current) drug therapy: Secondary | ICD-10-CM | POA: Insufficient documentation

## 2013-08-03 DIAGNOSIS — K219 Gastro-esophageal reflux disease without esophagitis: Secondary | ICD-10-CM | POA: Insufficient documentation

## 2013-08-03 DIAGNOSIS — G8929 Other chronic pain: Secondary | ICD-10-CM

## 2013-08-03 DIAGNOSIS — Z8742 Personal history of other diseases of the female genital tract: Secondary | ICD-10-CM | POA: Insufficient documentation

## 2013-08-03 LAB — URINALYSIS, ROUTINE W REFLEX MICROSCOPIC
Glucose, UA: NEGATIVE mg/dL
Ketones, ur: NEGATIVE mg/dL
Leukocytes, UA: NEGATIVE
pH: 6 (ref 5.0–8.0)

## 2013-08-03 LAB — COMPREHENSIVE METABOLIC PANEL
BUN: 15 mg/dL (ref 6–23)
CO2: 24 mEq/L (ref 19–32)
Chloride: 103 mEq/L (ref 96–112)
Creatinine, Ser: 1.03 mg/dL (ref 0.50–1.10)
GFR calc Af Amer: 75 mL/min — ABNORMAL LOW (ref >90)
GFR calc non Af Amer: 65 mL/min — ABNORMAL LOW (ref >90)
Glucose, Bld: 88 mg/dL (ref 70–99)
Total Bilirubin: 0.3 mg/dL (ref 0.3–1.2)

## 2013-08-03 LAB — POCT PREGNANCY, URINE: Preg Test, Ur: NEGATIVE

## 2013-08-03 LAB — CBC WITH DIFFERENTIAL/PLATELET
Eosinophils Relative: 1 % (ref 0–5)
HCT: 44.1 % (ref 36.0–46.0)
Hemoglobin: 15.5 g/dL — ABNORMAL HIGH (ref 12.0–15.0)
Lymphocytes Relative: 30 % (ref 12–46)
MCV: 90.9 fL (ref 78.0–100.0)
Monocytes Absolute: 0.8 10*3/uL (ref 0.1–1.0)
Monocytes Relative: 10 % (ref 3–12)
Neutro Abs: 4.7 10*3/uL (ref 1.7–7.7)
WBC: 8 10*3/uL (ref 4.0–10.5)

## 2013-08-03 LAB — URINE MICROSCOPIC-ADD ON

## 2013-08-03 LAB — LACTIC ACID, PLASMA: Lactic Acid, Venous: 0.9 mmol/L (ref 0.5–2.2)

## 2013-08-03 MED ORDER — SODIUM CHLORIDE 0.9 % IV BOLUS (SEPSIS)
1000.0000 mL | Freq: Once | INTRAVENOUS | Status: AC
  Administered 2013-08-03: 1000 mL via INTRAVENOUS

## 2013-08-03 MED ORDER — ONDANSETRON HCL 4 MG/2ML IJ SOLN
4.0000 mg | Freq: Once | INTRAMUSCULAR | Status: AC
  Administered 2013-08-03: 4 mg via INTRAVENOUS
  Filled 2013-08-03: qty 2

## 2013-08-03 MED ORDER — OMEPRAZOLE 20 MG PO CPDR: 20.0000 mg | DELAYED_RELEASE_CAPSULE | Freq: Every day | ORAL | Status: DC

## 2013-08-03 MED ORDER — HYDROMORPHONE HCL PF 1 MG/ML IJ SOLN
1.0000 mg | Freq: Once | INTRAMUSCULAR | Status: AC
  Administered 2013-08-03: 1 mg via INTRAVENOUS
  Filled 2013-08-03: qty 1

## 2013-08-03 MED ORDER — DICYCLOMINE HCL 20 MG PO TABS: 20.0000 mg | ORAL_TABLET | Freq: Two times a day (BID) | ORAL | Status: DC

## 2013-08-03 MED ORDER — DICYCLOMINE HCL 10 MG PO CAPS
10.0000 mg | ORAL_CAPSULE | Freq: Once | ORAL | Status: AC
  Administered 2013-08-03: 10 mg via ORAL
  Filled 2013-08-03: qty 1

## 2013-08-03 MED ORDER — PROMETHAZINE HCL 25 MG/ML IJ SOLN
25.0000 mg | Freq: Once | INTRAMUSCULAR | Status: AC
  Administered 2013-08-03: 25 mg via INTRAVENOUS
  Filled 2013-08-03: qty 1
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
## 2013-08-03 NOTE — ED Notes (Signed)
Pt discharged.Vital signs stable and GCS 15.Pt not happy and wanted more pain medications.

## 2013-08-03 NOTE — ED Notes (Signed)
Pt is here right lower quadrant constant abdominal pain since June.  Pt complains of back pain.  No urinary symptoms or vaginal symptoms.

## 2013-08-04 NOTE — ED Provider Notes (Signed)
Medical screening examination/treatment/procedure(s) were performed by non-physician practitioner and as supervising physician I was immediately available for consultation/collaboration.   Junius Argyle, MD 08/04/13 [phone]

## 2013-08-20 ENCOUNTER — Encounter: Payer: Self-pay | Admitting: Obstetrics

## 2013-08-23 ENCOUNTER — Encounter: Payer: Self-pay | Admitting: Obstetrics

## 2013-08-31 ENCOUNTER — Encounter (HOSPITAL_COMMUNITY): Payer: Self-pay | Admitting: Emergency Medicine

## 2013-08-31 ENCOUNTER — Emergency Department (HOSPITAL_COMMUNITY)
Admission: EM | Admit: 2013-08-31 | Discharge: 2013-08-31 | Disposition: A | Discharge status: Home / Self Care | Payer: Medicaid Other | Attending: Emergency Medicine | Admitting: Emergency Medicine

## 2013-08-31 ENCOUNTER — Emergency Department (HOSPITAL_COMMUNITY): Payer: Medicaid Other

## 2013-08-31 DIAGNOSIS — Z8619 Personal history of other infectious and parasitic diseases: Secondary | ICD-10-CM | POA: Insufficient documentation

## 2013-08-31 DIAGNOSIS — Z8659 Personal history of other mental and behavioral disorders: Secondary | ICD-10-CM | POA: Insufficient documentation

## 2013-08-31 DIAGNOSIS — Z79899 Other long term (current) drug therapy: Secondary | ICD-10-CM | POA: Insufficient documentation

## 2013-08-31 DIAGNOSIS — R197 Diarrhea, unspecified: Secondary | ICD-10-CM | POA: Insufficient documentation

## 2013-08-31 DIAGNOSIS — K219 Gastro-esophageal reflux disease without esophagitis: Secondary | ICD-10-CM | POA: Insufficient documentation

## 2013-08-31 DIAGNOSIS — R5381 Other malaise: Secondary | ICD-10-CM | POA: Insufficient documentation

## 2013-08-31 DIAGNOSIS — R61 Generalized hyperhidrosis: Secondary | ICD-10-CM | POA: Insufficient documentation

## 2013-08-31 DIAGNOSIS — Z8679 Personal history of other diseases of the circulatory system: Secondary | ICD-10-CM | POA: Insufficient documentation

## 2013-08-31 DIAGNOSIS — G8929 Other chronic pain: Secondary | ICD-10-CM | POA: Insufficient documentation

## 2013-08-31 DIAGNOSIS — J40 Bronchitis, not specified as acute or chronic: Secondary | ICD-10-CM

## 2013-08-31 DIAGNOSIS — Z8739 Personal history of other diseases of the musculoskeletal system and connective tissue: Secondary | ICD-10-CM | POA: Insufficient documentation

## 2013-08-31 DIAGNOSIS — F17200 Nicotine dependence, unspecified, uncomplicated: Secondary | ICD-10-CM | POA: Insufficient documentation

## 2013-08-31 DIAGNOSIS — J45901 Unspecified asthma with (acute) exacerbation: Secondary | ICD-10-CM | POA: Insufficient documentation

## 2013-08-31 LAB — CBC
Platelets: 159 10*3/uL (ref 150–400)
RBC: 4.64 MIL/uL (ref 3.87–5.11)
RDW: 13.6 % (ref 11.5–15.5)
WBC: 8.5 10*3/uL (ref 4.0–10.5)

## 2013-08-31 LAB — BASIC METABOLIC PANEL
CO2: 21 mEq/L (ref 19–32)
Chloride: 105 mEq/L (ref 96–112)
GFR calc Af Amer: >90 mL/min (ref >90)
Potassium: 3.9 mEq/L (ref 3.5–5.1)

## 2013-08-31 LAB — POCT I-STAT TROPONIN I: Troponin i, poc: 0 ng/mL (ref 0.00–0.08)

## 2013-08-31 LAB — TROPONIN I: Troponin I: <0.3 ng/mL (ref <0.30)

## 2013-08-31 MED ORDER — OXYCODONE-ACETAMINOPHEN 5-325 MG PO TABS
1.0000 | ORAL_TABLET | Freq: Once | ORAL | Status: AC
  Administered 2013-08-31: 1 via ORAL
  Filled 2013-08-31: qty 1

## 2013-08-31 MED ORDER — ALBUTEROL SULFATE (5 MG/ML) 0.5% IN NEBU
5.0000 mg | INHALATION_SOLUTION | Freq: Once | RESPIRATORY_TRACT | Status: AC
  Administered 2013-08-31: 5 mg via RESPIRATORY_TRACT
  Filled 2013-08-31: qty 1

## 2013-08-31 MED ORDER — ALBUTEROL SULFATE HFA 108 (90 BASE) MCG/ACT IN AERS: 1.0000 | INHALATION_SPRAY | Freq: Four times a day (QID) | RESPIRATORY_TRACT | Status: DC | PRN

## 2013-08-31 MED ORDER — ZOLPIDEM TARTRATE 5 MG PO TABS: 5.0000 mg | ORAL_TABLET | Freq: Every evening | ORAL | Status: DC | PRN

## 2013-08-31 NOTE — ED Notes (Signed)
Pt reports cold like symptoms that started 4 days ago, having nasal congestion/sneezing and a cough, pt reports this morning she felt like she was wheezing and c/o central CP. sts she has also been having diarrhea X 2 days, denies bld in stool. sts CP started around 7am this morning, sts she was just laying around the house this morning when it started, reports she felt sweaty, lightheaded and weak, denies n/v/SOB. Pt is everyday smoker. Pt in nad, skin warm and dry, resp e/u.
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
## 2013-08-31 NOTE — ED Notes (Signed)
Pt c/o mid sternal CP with SOB and diarrhea; pt sts cough with clear sputum

## 2013-09-03 NOTE — ED Provider Notes (Signed)
Medical screening examination/treatment/procedure(s) were performed by non-physician practitioner and as supervising physician I was immediately available for consultation/collaboration.  Toy Baker, MD 09/03/13 [phone]

## 2013-09-05 ENCOUNTER — Encounter (HOSPITAL_COMMUNITY): Payer: Self-pay | Admitting: *Deleted

## 2013-09-05 ENCOUNTER — Emergency Department (HOSPITAL_COMMUNITY): Payer: Medicaid Other

## 2013-09-05 ENCOUNTER — Emergency Department (HOSPITAL_COMMUNITY)
Admission: EM | Admit: 2013-09-05 | Discharge: 2013-09-05 | Disposition: A | Discharge status: Home / Self Care | Payer: Medicaid Other | Attending: Emergency Medicine | Admitting: Emergency Medicine

## 2013-09-05 DIAGNOSIS — IMO0002 Reserved for concepts with insufficient information to code with codable children: Secondary | ICD-10-CM | POA: Insufficient documentation

## 2013-09-05 DIAGNOSIS — Z8739 Personal history of other diseases of the musculoskeletal system and connective tissue: Secondary | ICD-10-CM | POA: Insufficient documentation

## 2013-09-05 DIAGNOSIS — Z79899 Other long term (current) drug therapy: Secondary | ICD-10-CM | POA: Insufficient documentation

## 2013-09-05 DIAGNOSIS — J45901 Unspecified asthma with (acute) exacerbation: Secondary | ICD-10-CM | POA: Insufficient documentation

## 2013-09-05 DIAGNOSIS — Z8619 Personal history of other infectious and parasitic diseases: Secondary | ICD-10-CM | POA: Insufficient documentation

## 2013-09-05 DIAGNOSIS — Z87828 Personal history of other (healed) physical injury and trauma: Secondary | ICD-10-CM | POA: Insufficient documentation

## 2013-09-05 DIAGNOSIS — M549 Dorsalgia, unspecified: Secondary | ICD-10-CM | POA: Insufficient documentation

## 2013-09-05 DIAGNOSIS — J3489 Other specified disorders of nose and nasal sinuses: Secondary | ICD-10-CM | POA: Insufficient documentation

## 2013-09-05 DIAGNOSIS — R05 Cough: Secondary | ICD-10-CM

## 2013-09-05 DIAGNOSIS — R059 Cough, unspecified: Secondary | ICD-10-CM

## 2013-09-05 DIAGNOSIS — Z8679 Personal history of other diseases of the circulatory system: Secondary | ICD-10-CM | POA: Insufficient documentation

## 2013-09-05 DIAGNOSIS — K219 Gastro-esophageal reflux disease without esophagitis: Secondary | ICD-10-CM | POA: Insufficient documentation

## 2013-09-05 DIAGNOSIS — Z8659 Personal history of other mental and behavioral disorders: Secondary | ICD-10-CM | POA: Insufficient documentation

## 2013-09-05 DIAGNOSIS — F17200 Nicotine dependence, unspecified, uncomplicated: Secondary | ICD-10-CM | POA: Insufficient documentation

## 2013-09-05 DIAGNOSIS — G8929 Other chronic pain: Secondary | ICD-10-CM | POA: Insufficient documentation

## 2013-09-05 MED ORDER — HYDROCOD POLST-CHLORPHEN POLST 10-8 MG/5ML PO LQCR
5.0000 mL | Freq: Once | ORAL | Status: AC
  Administered 2013-09-05: 5 mL via ORAL
  Filled 2013-09-05: qty 5

## 2013-09-05 MED ORDER — PREDNISONE 20 MG PO TABS: 20.0000 mg | ORAL_TABLET | Freq: Every day | ORAL | Status: DC

## 2013-09-05 MED ORDER — HYDROCOD POLST-CHLORPHEN POLST 10-8 MG/5ML PO LQCR: 5.0000 mL | Freq: Two times a day (BID) | ORAL | Status: DC | PRN

## 2013-09-05 MED ORDER — ALBUTEROL SULFATE (5 MG/ML) 0.5% IN NEBU
5.0000 mg | INHALATION_SOLUTION | Freq: Once | RESPIRATORY_TRACT | Status: AC
  Administered 2013-09-05: 5 mg via RESPIRATORY_TRACT
  Filled 2013-09-05: qty 1

## 2013-09-05 MED ORDER — IPRATROPIUM BROMIDE 0.02 % IN SOLN
0.5000 mg | Freq: Once | RESPIRATORY_TRACT | Status: AC
  Administered 2013-09-05: 0.5 mg via RESPIRATORY_TRACT
  Filled 2013-09-05: qty 2.5

## 2013-09-05 NOTE — ED Notes (Signed)
Pt states understanding of discharge instructions 
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
## 2013-09-05 NOTE — ED Notes (Signed)
Pt in c/o cough and congestion over the last several days, states she was seen here for same and dx with bronchitis, states she has been taking her steroids and inhaler without improvement, states she is wheezing more, states she has had a fever over the last few days

## 2013-09-06 NOTE — ED Provider Notes (Signed)
Medical screening examination/treatment/procedure(s) were performed by non-physician practitioner and as supervising physician I was immediately available for consultation/collaboration.   Nury Nebergall, MD 09/06/13 2303 

## 2013-09-26 ENCOUNTER — Emergency Department (HOSPITAL_COMMUNITY)
Admission: EM | Admit: 2013-09-26 | Discharge: 2013-09-26 | Disposition: A | Discharge status: Home / Self Care | Payer: Medicaid Other | Attending: Emergency Medicine | Admitting: Emergency Medicine

## 2013-09-26 ENCOUNTER — Encounter (HOSPITAL_COMMUNITY): Payer: Self-pay | Admitting: *Deleted

## 2013-09-26 DIAGNOSIS — L02211 Cutaneous abscess of abdominal wall: Secondary | ICD-10-CM

## 2013-09-26 DIAGNOSIS — Z8679 Personal history of other diseases of the circulatory system: Secondary | ICD-10-CM | POA: Insufficient documentation

## 2013-09-26 DIAGNOSIS — Z79899 Other long term (current) drug therapy: Secondary | ICD-10-CM | POA: Insufficient documentation

## 2013-09-26 DIAGNOSIS — F17200 Nicotine dependence, unspecified, uncomplicated: Secondary | ICD-10-CM | POA: Insufficient documentation

## 2013-09-26 DIAGNOSIS — Z8659 Personal history of other mental and behavioral disorders: Secondary | ICD-10-CM | POA: Insufficient documentation

## 2013-09-26 DIAGNOSIS — Z8619 Personal history of other infectious and parasitic diseases: Secondary | ICD-10-CM | POA: Insufficient documentation

## 2013-09-26 DIAGNOSIS — R197 Diarrhea, unspecified: Secondary | ICD-10-CM | POA: Insufficient documentation

## 2013-09-26 DIAGNOSIS — Z8739 Personal history of other diseases of the musculoskeletal system and connective tissue: Secondary | ICD-10-CM | POA: Insufficient documentation

## 2013-09-26 DIAGNOSIS — L02219 Cutaneous abscess of trunk, unspecified: Secondary | ICD-10-CM | POA: Insufficient documentation

## 2013-09-26 DIAGNOSIS — G8929 Other chronic pain: Secondary | ICD-10-CM | POA: Insufficient documentation

## 2013-09-26 DIAGNOSIS — J45909 Unspecified asthma, uncomplicated: Secondary | ICD-10-CM | POA: Insufficient documentation

## 2013-09-26 DIAGNOSIS — Z8711 Personal history of peptic ulcer disease: Secondary | ICD-10-CM | POA: Insufficient documentation

## 2013-09-26 DIAGNOSIS — R110 Nausea: Secondary | ICD-10-CM | POA: Insufficient documentation

## 2013-09-26 DIAGNOSIS — K219 Gastro-esophageal reflux disease without esophagitis: Secondary | ICD-10-CM | POA: Insufficient documentation

## 2013-09-26 MED ORDER — CLINDAMYCIN HCL 300 MG PO CAPS: 300.0000 mg | ORAL_CAPSULE | Freq: Three times a day (TID) | ORAL | Status: DC

## 2013-09-26 MED ORDER — NICOTINE 21 MG/24HR TD PT24: 1.0000 | MEDICATED_PATCH | TRANSDERMAL | Status: DC

## 2013-09-26 NOTE — ED Notes (Signed)
Pt thinks a spider bit her lower abd acouple of days ago. Nickle red area with bloody drainage, tender to touch. Has been draining all day.
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
## 2013-09-29 ENCOUNTER — Encounter: Payer: Self-pay | Admitting: Obstetrics

## 2013-10-08 NOTE — ED Provider Notes (Signed)
Medical screening examination/treatment/procedure(s) were performed by non-physician practitioner and as supervising physician I was immediately available for consultation/collaboration.    Nelia Shi, MD 10/08/13 [phone]

## 2013-10-12 ENCOUNTER — Emergency Department (INDEPENDENT_AMBULATORY_CARE_PROVIDER_SITE_OTHER): Payer: Medicaid Other

## 2013-10-12 ENCOUNTER — Encounter (HOSPITAL_COMMUNITY): Payer: Self-pay | Admitting: Emergency Medicine

## 2013-10-12 ENCOUNTER — Emergency Department (INDEPENDENT_AMBULATORY_CARE_PROVIDER_SITE_OTHER)
Admission: EM | Admit: 2013-10-12 | Discharge: 2013-10-12 | Disposition: A | Discharge status: Home / Self Care | Payer: Medicaid Other | Source: Home / Self Care | Attending: Family Medicine | Admitting: Family Medicine

## 2013-10-12 DIAGNOSIS — S63509A Unspecified sprain of unspecified wrist, initial encounter: Secondary | ICD-10-CM

## 2013-10-12 DIAGNOSIS — H109 Unspecified conjunctivitis: Secondary | ICD-10-CM

## 2013-10-12 DIAGNOSIS — S63501A Unspecified sprain of right wrist, initial encounter: Secondary | ICD-10-CM

## 2013-10-12 MED ORDER — TOBRAMYCIN 0.3 % OP SOLN: 1.0000 [drp] | Freq: Four times a day (QID) | OPHTHALMIC | Status: DC

## 2013-10-12 NOTE — ED Notes (Signed)
Delay in triage documentation secondary to department acuity

## 2013-10-12 NOTE — ED Notes (Signed)
Patient has multiple complaints.  Right wrist pain from fall one week ago.  Pain in right wrist.  Hyper flexed with a fall one week ago.  Patient reports eyes have been bothering her for several months, redness, watery, drainage.
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
## 2013-11-18 ENCOUNTER — Emergency Department (HOSPITAL_COMMUNITY): Payer: Medicaid Other

## 2013-11-18 ENCOUNTER — Emergency Department (HOSPITAL_COMMUNITY)
Admission: EM | Admit: 2013-11-18 | Discharge: 2013-11-18 | Disposition: A | Discharge status: Home / Self Care | Payer: Medicaid Other | Attending: Emergency Medicine | Admitting: Emergency Medicine

## 2013-11-18 ENCOUNTER — Encounter (HOSPITAL_COMMUNITY): Payer: Self-pay | Admitting: Emergency Medicine

## 2013-11-18 DIAGNOSIS — F17200 Nicotine dependence, unspecified, uncomplicated: Secondary | ICD-10-CM | POA: Insufficient documentation

## 2013-11-18 DIAGNOSIS — J069 Acute upper respiratory infection, unspecified: Secondary | ICD-10-CM

## 2013-11-18 DIAGNOSIS — J45909 Unspecified asthma, uncomplicated: Secondary | ICD-10-CM | POA: Insufficient documentation

## 2013-11-18 DIAGNOSIS — Z8619 Personal history of other infectious and parasitic diseases: Secondary | ICD-10-CM | POA: Insufficient documentation

## 2013-11-18 DIAGNOSIS — Z8679 Personal history of other diseases of the circulatory system: Secondary | ICD-10-CM | POA: Insufficient documentation

## 2013-11-18 DIAGNOSIS — Z8659 Personal history of other mental and behavioral disorders: Secondary | ICD-10-CM | POA: Insufficient documentation

## 2013-11-18 DIAGNOSIS — H109 Unspecified conjunctivitis: Secondary | ICD-10-CM | POA: Insufficient documentation

## 2013-11-18 DIAGNOSIS — G8929 Other chronic pain: Secondary | ICD-10-CM | POA: Insufficient documentation

## 2013-11-18 DIAGNOSIS — Z8739 Personal history of other diseases of the musculoskeletal system and connective tissue: Secondary | ICD-10-CM | POA: Insufficient documentation

## 2013-11-18 DIAGNOSIS — K219 Gastro-esophageal reflux disease without esophagitis: Secondary | ICD-10-CM | POA: Insufficient documentation

## 2013-11-18 DIAGNOSIS — Z79899 Other long term (current) drug therapy: Secondary | ICD-10-CM | POA: Insufficient documentation

## 2013-11-18 MED ORDER — HYDROCODONE-HOMATROPINE 5-1.5 MG/5ML PO SYRP
5.0000 mL | ORAL_SOLUTION | Freq: Four times a day (QID) | ORAL | Status: DC | PRN
  Administered 2013-11-18: 5 mL via ORAL
  Filled 2013-11-18: qty 5

## 2013-11-18 MED ORDER — HYDROCODONE-HOMATROPINE 5-1.5 MG/5ML PO SYRP: 5.0000 mL | ORAL_SOLUTION | Freq: Four times a day (QID) | ORAL | Status: DC | PRN

## 2013-11-18 NOTE — ED Notes (Signed)
PT ambulated with baseline gait; VSS; A&Ox3; no signs of distress; respirations even and unlabored; skin warm and dry; no questions upon discharge.  

## 2013-11-18 NOTE — ED Notes (Signed)
Patient transported to X-ray 

## 2013-11-18 NOTE — ED Notes (Signed)
Pt reports cough with yellow and green discharge since Saturday. Also reports dx with eye infection that is contagious that she has seen eye MD for and been taking meds for. Reports not getting any better.
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
## 2013-11-19 NOTE — ED Provider Notes (Signed)
Medical screening examination/treatment/procedure(s) were performed by non-physician practitioner and as supervising physician I was immediately available for consultation/collaboration.  EKG Interpretation   None         Suzi Roots, MD 11/19/13 1530

## 2014-04-26 ENCOUNTER — Ambulatory Visit (INDEPENDENT_AMBULATORY_CARE_PROVIDER_SITE_OTHER): Payer: Medicaid Other | Admitting: Obstetrics

## 2014-04-26 ENCOUNTER — Encounter: Payer: Self-pay | Admitting: Obstetrics

## 2014-04-26 VITALS — BP 125/87 | HR 69 | Temp 98.5°F | Wt 238.0 lb

## 2014-04-26 DIAGNOSIS — N760 Acute vaginitis: Secondary | ICD-10-CM

## 2014-04-26 DIAGNOSIS — Z0000 Encounter for general adult medical examination without abnormal findings: Secondary | ICD-10-CM

## 2014-04-27 LAB — GC/CHLAMYDIA PROBE AMP
CT Probe RNA: NEGATIVE
GC Probe RNA: NEGATIVE

## 2014-04-27 LAB — WET PREP BY MOLECULAR PROBE
CANDIDA SPECIES: NEGATIVE
GARDNERELLA VAGINALIS: POSITIVE — AB
Trichomonas vaginosis: POSITIVE — AB

## 2014-04-28 ENCOUNTER — Other Ambulatory Visit: Payer: Self-pay | Admitting: *Deleted

## 2014-04-28 ENCOUNTER — Encounter: Payer: Self-pay | Admitting: Obstetrics

## 2014-04-28 DIAGNOSIS — A599 Trichomoniasis, unspecified: Secondary | ICD-10-CM

## 2014-04-28 DIAGNOSIS — N760 Acute vaginitis: Secondary | ICD-10-CM

## 2014-04-28 DIAGNOSIS — B9689 Other specified bacterial agents as the cause of diseases classified elsewhere: Secondary | ICD-10-CM

## 2014-04-28 MED ORDER — METRONIDAZOLE 0.75 % VA GEL: 1.0000 | Freq: Every day | VAGINAL | Status: DC

## 2014-04-28 MED ORDER — METRONIDAZOLE 500 MG PO TABS: 2000.0000 mg | ORAL_TABLET | Freq: Once | ORAL | Status: DC
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
## 2014-05-16 ENCOUNTER — Emergency Department (HOSPITAL_COMMUNITY): Payer: Medicaid Other

## 2014-05-16 ENCOUNTER — Emergency Department (HOSPITAL_COMMUNITY)
Admission: EM | Admit: 2014-05-16 | Discharge: 2014-05-16 | Disposition: A | Discharge status: Home / Self Care | Payer: Medicaid Other | Attending: Emergency Medicine | Admitting: Emergency Medicine

## 2014-05-16 ENCOUNTER — Encounter (HOSPITAL_COMMUNITY): Payer: Self-pay | Admitting: Emergency Medicine

## 2014-05-16 DIAGNOSIS — X500XXA Overexertion from strenuous movement or load, initial encounter: Secondary | ICD-10-CM | POA: Insufficient documentation

## 2014-05-16 DIAGNOSIS — M549 Dorsalgia, unspecified: Secondary | ICD-10-CM

## 2014-05-16 DIAGNOSIS — Z8739 Personal history of other diseases of the musculoskeletal system and connective tissue: Secondary | ICD-10-CM | POA: Insufficient documentation

## 2014-05-16 DIAGNOSIS — R296 Repeated falls: Secondary | ICD-10-CM | POA: Insufficient documentation

## 2014-05-16 DIAGNOSIS — Y9289 Other specified places as the place of occurrence of the external cause: Secondary | ICD-10-CM | POA: Insufficient documentation

## 2014-05-16 DIAGNOSIS — Z87891 Personal history of nicotine dependence: Secondary | ICD-10-CM | POA: Insufficient documentation

## 2014-05-16 DIAGNOSIS — S99929A Unspecified injury of unspecified foot, initial encounter: Principal | ICD-10-CM

## 2014-05-16 DIAGNOSIS — S8990XA Unspecified injury of unspecified lower leg, initial encounter: Secondary | ICD-10-CM | POA: Insufficient documentation

## 2014-05-16 DIAGNOSIS — J45909 Unspecified asthma, uncomplicated: Secondary | ICD-10-CM | POA: Insufficient documentation

## 2014-05-16 DIAGNOSIS — IMO0002 Reserved for concepts with insufficient information to code with codable children: Secondary | ICD-10-CM | POA: Insufficient documentation

## 2014-05-16 DIAGNOSIS — Z87828 Personal history of other (healed) physical injury and trauma: Secondary | ICD-10-CM | POA: Insufficient documentation

## 2014-05-16 DIAGNOSIS — Z79899 Other long term (current) drug therapy: Secondary | ICD-10-CM | POA: Insufficient documentation

## 2014-05-16 DIAGNOSIS — S99919A Unspecified injury of unspecified ankle, initial encounter: Principal | ICD-10-CM

## 2014-05-16 DIAGNOSIS — Z8619 Personal history of other infectious and parasitic diseases: Secondary | ICD-10-CM | POA: Insufficient documentation

## 2014-05-16 DIAGNOSIS — M25571 Pain in right ankle and joints of right foot: Secondary | ICD-10-CM

## 2014-05-16 DIAGNOSIS — Z8659 Personal history of other mental and behavioral disorders: Secondary | ICD-10-CM | POA: Insufficient documentation

## 2014-05-16 DIAGNOSIS — Z8679 Personal history of other diseases of the circulatory system: Secondary | ICD-10-CM | POA: Insufficient documentation

## 2014-05-16 DIAGNOSIS — W19XXXA Unspecified fall, initial encounter: Secondary | ICD-10-CM

## 2014-05-16 DIAGNOSIS — Y9389 Activity, other specified: Secondary | ICD-10-CM | POA: Insufficient documentation

## 2014-05-16 DIAGNOSIS — K219 Gastro-esophageal reflux disease without esophagitis: Secondary | ICD-10-CM | POA: Insufficient documentation

## 2014-05-16 DIAGNOSIS — G8929 Other chronic pain: Secondary | ICD-10-CM | POA: Insufficient documentation

## 2014-05-16 DIAGNOSIS — Z9889 Other specified postprocedural states: Secondary | ICD-10-CM | POA: Insufficient documentation

## 2014-05-16 MED ORDER — OXYCODONE-ACETAMINOPHEN 5-325 MG PO TABS
1.0000 | ORAL_TABLET | Freq: Once | ORAL | Status: AC
  Administered 2014-05-16: 1 via ORAL
  Filled 2014-05-16: qty 1

## 2014-05-16 MED ORDER — OXYCODONE-ACETAMINOPHEN 5-325 MG PO TABS: 1.0000 | ORAL_TABLET | ORAL | Status: DC | PRN

## 2014-05-16 NOTE — Discharge Instructions (Signed)
Take the prescribed medication as directed for pain. Follow-up with your primary care physician to discuss re-starting your physical therapy. Follow-up with orthopedics regarding your right ankle if symptoms do not improve in the next few days.  Continue wearing brace for added support. Return to the ED for new or worsening symptoms.
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
## 2014-05-16 NOTE — ED Notes (Signed)
Applied ASO brace to right ankle.

## 2014-05-17 NOTE — ED Provider Notes (Signed)
Medical screening examination/treatment/procedure(s) were performed by non-physician practitioner and as supervising physician I was immediately available for consultation/collaboration.    Mirna Mires, MD 05/17/14 [phone]

## 2014-05-18 ENCOUNTER — Ambulatory Visit: Payer: Medicaid Other | Admitting: Obstetrics

## 2014-05-25 ENCOUNTER — Emergency Department (HOSPITAL_COMMUNITY)
Admission: EM | Admit: 2014-05-25 | Discharge: 2014-05-25 | Disposition: A | Discharge status: Home / Self Care | Payer: Medicaid Other | Attending: Emergency Medicine | Admitting: Emergency Medicine

## 2014-05-25 ENCOUNTER — Encounter (HOSPITAL_COMMUNITY): Payer: Self-pay | Admitting: Emergency Medicine

## 2014-05-25 DIAGNOSIS — Z8659 Personal history of other mental and behavioral disorders: Secondary | ICD-10-CM | POA: Insufficient documentation

## 2014-05-25 DIAGNOSIS — K219 Gastro-esophageal reflux disease without esophagitis: Secondary | ICD-10-CM

## 2014-05-25 DIAGNOSIS — Z8679 Personal history of other diseases of the circulatory system: Secondary | ICD-10-CM | POA: Insufficient documentation

## 2014-05-25 DIAGNOSIS — Z87828 Personal history of other (healed) physical injury and trauma: Secondary | ICD-10-CM | POA: Insufficient documentation

## 2014-05-25 DIAGNOSIS — J45909 Unspecified asthma, uncomplicated: Secondary | ICD-10-CM | POA: Insufficient documentation

## 2014-05-25 DIAGNOSIS — Z8619 Personal history of other infectious and parasitic diseases: Secondary | ICD-10-CM | POA: Insufficient documentation

## 2014-05-25 DIAGNOSIS — G609 Hereditary and idiopathic neuropathy, unspecified: Secondary | ICD-10-CM | POA: Insufficient documentation

## 2014-05-25 DIAGNOSIS — M129 Arthropathy, unspecified: Secondary | ICD-10-CM | POA: Insufficient documentation

## 2014-05-25 DIAGNOSIS — G8929 Other chronic pain: Secondary | ICD-10-CM | POA: Insufficient documentation

## 2014-05-25 DIAGNOSIS — Z87891 Personal history of nicotine dependence: Secondary | ICD-10-CM | POA: Insufficient documentation

## 2014-05-25 DIAGNOSIS — G629 Polyneuropathy, unspecified: Secondary | ICD-10-CM

## 2014-05-25 DIAGNOSIS — Z9889 Other specified postprocedural states: Secondary | ICD-10-CM | POA: Insufficient documentation

## 2014-05-25 MED ORDER — OMEPRAZOLE 20 MG PO CPDR: 20.0000 mg | DELAYED_RELEASE_CAPSULE | Freq: Every day | ORAL | Status: DC

## 2014-05-25 MED ORDER — PREGABALIN 75 MG PO CAPS: 75.0000 mg | ORAL_CAPSULE | Freq: Three times a day (TID) | ORAL | Status: DC

## 2014-05-25 NOTE — ED Notes (Signed)
Pt c/o swelling in ankle joint swelling and pain. sts she was recently seen here for the same. sts she went to her PCP but wasn't able to be seen so she came here. Pt reports no changes since she was seen here last week. No pitting edema seen. Nad, skin warm and dry, resp e/u.

## 2014-05-25 NOTE — ED Notes (Signed)
Called for triage x1.

## 2014-05-25 NOTE — Discharge Instructions (Signed)
Take Lyrica as directed for your neuropathic pain. Take Omeprazole for your GERD. Refer to attached documents for more information.
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
## 2014-05-28 NOTE — ED Provider Notes (Signed)
Medical screening examination/treatment/procedure(s) were performed by non-physician practitioner and as supervising physician I was immediately available for consultation/collaboration.   EKG Interpretation None        Kristen N Ward, DO 05/28/14 0713 

## 2014-06-08 ENCOUNTER — Ambulatory Visit: Payer: Medicaid Other | Admitting: Obstetrics

## 2014-06-15 ENCOUNTER — Emergency Department (HOSPITAL_COMMUNITY)
Admission: EM | Admit: 2014-06-15 | Discharge: 2014-06-15 | Disposition: A | Discharge status: Home / Self Care | Payer: Medicaid Other | Attending: Emergency Medicine | Admitting: Emergency Medicine

## 2014-06-15 ENCOUNTER — Emergency Department (HOSPITAL_COMMUNITY): Payer: Medicaid Other

## 2014-06-15 ENCOUNTER — Encounter (HOSPITAL_COMMUNITY): Payer: Self-pay | Admitting: Emergency Medicine

## 2014-06-15 DIAGNOSIS — Z791 Long term (current) use of non-steroidal anti-inflammatories (NSAID): Secondary | ICD-10-CM | POA: Insufficient documentation

## 2014-06-15 DIAGNOSIS — M25571 Pain in right ankle and joints of right foot: Secondary | ICD-10-CM

## 2014-06-15 DIAGNOSIS — X500XXA Overexertion from strenuous movement or load, initial encounter: Secondary | ICD-10-CM | POA: Insufficient documentation

## 2014-06-15 DIAGNOSIS — G8929 Other chronic pain: Secondary | ICD-10-CM | POA: Insufficient documentation

## 2014-06-15 DIAGNOSIS — M1710 Unilateral primary osteoarthritis, unspecified knee: Secondary | ICD-10-CM | POA: Insufficient documentation

## 2014-06-15 DIAGNOSIS — Z8619 Personal history of other infectious and parasitic diseases: Secondary | ICD-10-CM | POA: Insufficient documentation

## 2014-06-15 DIAGNOSIS — K219 Gastro-esophageal reflux disease without esophagitis: Secondary | ICD-10-CM | POA: Insufficient documentation

## 2014-06-15 DIAGNOSIS — J45909 Unspecified asthma, uncomplicated: Secondary | ICD-10-CM | POA: Insufficient documentation

## 2014-06-15 DIAGNOSIS — Z79899 Other long term (current) drug therapy: Secondary | ICD-10-CM | POA: Insufficient documentation

## 2014-06-15 DIAGNOSIS — S8990XA Unspecified injury of unspecified lower leg, initial encounter: Secondary | ICD-10-CM | POA: Insufficient documentation

## 2014-06-15 DIAGNOSIS — IMO0002 Reserved for concepts with insufficient information to code with codable children: Secondary | ICD-10-CM

## 2014-06-15 DIAGNOSIS — Y929 Unspecified place or not applicable: Secondary | ICD-10-CM | POA: Insufficient documentation

## 2014-06-15 DIAGNOSIS — G43909 Migraine, unspecified, not intractable, without status migrainosus: Secondary | ICD-10-CM | POA: Insufficient documentation

## 2014-06-15 DIAGNOSIS — Z87891 Personal history of nicotine dependence: Secondary | ICD-10-CM | POA: Insufficient documentation

## 2014-06-15 DIAGNOSIS — S99919A Unspecified injury of unspecified ankle, initial encounter: Principal | ICD-10-CM

## 2014-06-15 DIAGNOSIS — M79674 Pain in right toe(s): Secondary | ICD-10-CM

## 2014-06-15 DIAGNOSIS — S99929A Unspecified injury of unspecified foot, initial encounter: Principal | ICD-10-CM

## 2014-06-15 DIAGNOSIS — Y9389 Activity, other specified: Secondary | ICD-10-CM | POA: Insufficient documentation

## 2014-06-15 MED ORDER — OXYCODONE-ACETAMINOPHEN 5-325 MG PO TABS
2.0000 | ORAL_TABLET | Freq: Once | ORAL | Status: AC
  Administered 2014-06-15: 2 via ORAL
  Filled 2014-06-15: qty 2

## 2014-06-15 MED ORDER — KETOROLAC TROMETHAMINE 60 MG/2ML IM SOLN
60.0000 mg | Freq: Once | INTRAMUSCULAR | Status: AC
  Administered 2014-06-15: 60 mg via INTRAMUSCULAR
  Filled 2014-06-15: qty 2

## 2014-06-15 MED ORDER — MELOXICAM 7.5 MG PO TABS: 15.0000 mg | ORAL_TABLET | Freq: Every day | ORAL | Status: DC

## 2014-06-15 MED ORDER — OXYCODONE-ACETAMINOPHEN 5-325 MG PO TABS: 1.0000 | ORAL_TABLET | Freq: Four times a day (QID) | ORAL | Status: DC | PRN
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
## 2014-06-15 NOTE — ED Notes (Signed)
Pt reports right ankle pain and swelling x 2 months and twisted ankle again yesterday causing more pain.

## 2014-06-17 NOTE — ED Provider Notes (Signed)
Medical screening examination/treatment/procedure(s) were performed by non-physician practitioner and as supervising physician I was immediately available for consultation/collaboration.   Neta Ehlers, MD 06/17/14 [phone]

## 2014-07-15 ENCOUNTER — Encounter: Payer: Self-pay | Admitting: Internal Medicine

## 2014-07-15 ENCOUNTER — Ambulatory Visit: Payer: Medicaid Other | Attending: Internal Medicine | Admitting: Internal Medicine

## 2014-07-15 VITALS — BP 128/85 | HR 79 | Temp 97.8°F | Resp 16 | Ht 65.0 in | Wt 245.0 lb

## 2014-07-15 DIAGNOSIS — Z87891 Personal history of nicotine dependence: Secondary | ICD-10-CM | POA: Insufficient documentation

## 2014-07-15 DIAGNOSIS — M79609 Pain in unspecified limb: Secondary | ICD-10-CM

## 2014-07-15 DIAGNOSIS — M542 Cervicalgia: Secondary | ICD-10-CM | POA: Insufficient documentation

## 2014-07-15 DIAGNOSIS — M79604 Pain in right leg: Secondary | ICD-10-CM

## 2014-07-15 LAB — LIPID PANEL
Cholesterol: 172 mg/dL (ref 0–200)
HDL: 42 mg/dL (ref >39)
LDL Cholesterol: 97 mg/dL (ref 0–99)
Total CHOL/HDL Ratio: 4.1 Ratio
Triglycerides: 163 mg/dL — ABNORMAL HIGH (ref <150)
VLDL: 33 mg/dL (ref 0–40)

## 2014-07-15 LAB — CBC
HCT: 43.9 % (ref 36.0–46.0)
HEMOGLOBIN: 15.4 g/dL — AB (ref 12.0–15.0)
MCH: 30.6 pg (ref 26.0–34.0)
MCHC: 35.1 g/dL (ref 30.0–36.0)
MCV: 87.3 fL (ref 78.0–100.0)
Platelets: 190 10*3/uL (ref 150–400)
RBC: 5.03 MIL/uL (ref 3.87–5.11)
RDW: 13.9 % (ref 11.5–15.5)
WBC: 8.2 10*3/uL (ref 4.0–10.5)

## 2014-07-15 LAB — COMPLETE METABOLIC PANEL WITH GFR
ALBUMIN: 4.2 g/dL (ref 3.5–5.2)
ALT: 53 U/L — ABNORMAL HIGH (ref 0–35)
AST: 34 U/L (ref 0–37)
Alkaline Phosphatase: 53 U/L (ref 39–117)
BUN: 10 mg/dL (ref 6–23)
CALCIUM: 9.2 mg/dL (ref 8.4–10.5)
CHLORIDE: 102 meq/L (ref 96–112)
CO2: 26 mEq/L (ref 19–32)
Creat: 0.85 mg/dL (ref 0.50–1.10)
GFR, Est African American: >89 mL/min
GFR, Est Non African American: 83 mL/min
GLUCOSE: 83 mg/dL (ref 70–99)
POTASSIUM: 4.3 meq/L (ref 3.5–5.3)
SODIUM: 137 meq/L (ref 135–145)
TOTAL PROTEIN: 6.7 g/dL (ref 6.0–8.3)
Total Bilirubin: 0.5 mg/dL (ref 0.2–1.2)

## 2014-07-15 LAB — HEMOGLOBIN A1C
HEMOGLOBIN A1C: 5.9 % — AB (ref <5.7)
Mean Plasma Glucose: 123 mg/dL — ABNORMAL HIGH (ref <117)

## 2014-07-15 LAB — TSH: TSH: 5.36 u[IU]/mL — AB (ref 0.350–4.500)

## 2014-07-15 MED ORDER — MELOXICAM 15 MG PO TABS: 15.0000 mg | ORAL_TABLET | Freq: Every day | ORAL | Status: DC

## 2014-07-15 MED ORDER — CYCLOBENZAPRINE HCL 10 MG PO TABS: 10.0000 mg | ORAL_TABLET | Freq: Two times a day (BID) | ORAL | Status: DC | PRN
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
## 2014-07-16 LAB — VITAMIN D 25 HYDROXY (VIT D DEFICIENCY, FRACTURES): Vit D, 25-Hydroxy: 29 ng/mL — ABNORMAL LOW (ref 30–89)

## 2014-07-19 ENCOUNTER — Telehealth: Payer: Self-pay | Admitting: *Deleted

## 2014-07-19 DIAGNOSIS — R51 Headache: Secondary | ICD-10-CM

## 2014-07-19 DIAGNOSIS — R7989 Other specified abnormal findings of blood chemistry: Secondary | ICD-10-CM

## 2014-07-19 NOTE — Telephone Encounter (Signed)
Reviewed results and instructions with patient. Free T4 ordered and lab appt made for this week. Patient understands that she should take Vit D3 1000 IU OTC, however, patient states she doesn't know if she will be able to afford it. Diet and exercise discussed. Patient c/o neck and head pain not relieved with mobic or flexeril. Referral to neurology made per Provider.

## 2014-07-19 NOTE — Telephone Encounter (Signed)
Message copied by Velora Heckler on Tue Jul 19, 2014  4:36 PM ------      Message from: Chari Manning A      Created: Mon Jul 18, 2014  6:26 PM       Have patient come back for free t4. TSH was high.  Patient may get OTC Vitamin d 1,000 IU and take daily due to low vitamin d level.  Please educate patient about diet and exercise. ------

## 2014-07-21 ENCOUNTER — Emergency Department (HOSPITAL_COMMUNITY): Payer: Medicaid Other

## 2014-07-21 ENCOUNTER — Encounter (HOSPITAL_COMMUNITY): Payer: Self-pay | Admitting: Emergency Medicine

## 2014-07-21 ENCOUNTER — Emergency Department (HOSPITAL_COMMUNITY)
Admission: EM | Admit: 2014-07-21 | Discharge: 2014-07-21 | Disposition: A | Discharge status: Home / Self Care | Payer: Medicaid Other | Attending: Emergency Medicine | Admitting: Emergency Medicine

## 2014-07-21 DIAGNOSIS — F3289 Other specified depressive episodes: Secondary | ICD-10-CM | POA: Diagnosis not present

## 2014-07-21 DIAGNOSIS — R5381 Other malaise: Secondary | ICD-10-CM | POA: Diagnosis not present

## 2014-07-21 DIAGNOSIS — K219 Gastro-esophageal reflux disease without esophagitis: Secondary | ICD-10-CM | POA: Diagnosis not present

## 2014-07-21 DIAGNOSIS — J45909 Unspecified asthma, uncomplicated: Secondary | ICD-10-CM | POA: Insufficient documentation

## 2014-07-21 DIAGNOSIS — R110 Nausea: Secondary | ICD-10-CM | POA: Diagnosis not present

## 2014-07-21 DIAGNOSIS — G8929 Other chronic pain: Secondary | ICD-10-CM | POA: Insufficient documentation

## 2014-07-21 DIAGNOSIS — R079 Chest pain, unspecified: Secondary | ICD-10-CM

## 2014-07-21 DIAGNOSIS — R42 Dizziness and giddiness: Secondary | ICD-10-CM | POA: Diagnosis not present

## 2014-07-21 DIAGNOSIS — R630 Anorexia: Secondary | ICD-10-CM | POA: Diagnosis not present

## 2014-07-21 DIAGNOSIS — R55 Syncope and collapse: Secondary | ICD-10-CM | POA: Insufficient documentation

## 2014-07-21 DIAGNOSIS — Z79899 Other long term (current) drug therapy: Secondary | ICD-10-CM | POA: Insufficient documentation

## 2014-07-21 DIAGNOSIS — Z8739 Personal history of other diseases of the musculoskeletal system and connective tissue: Secondary | ICD-10-CM | POA: Insufficient documentation

## 2014-07-21 DIAGNOSIS — F411 Generalized anxiety disorder: Secondary | ICD-10-CM | POA: Diagnosis not present

## 2014-07-21 DIAGNOSIS — R6883 Chills (without fever): Secondary | ICD-10-CM | POA: Diagnosis not present

## 2014-07-21 DIAGNOSIS — Z87891 Personal history of nicotine dependence: Secondary | ICD-10-CM | POA: Insufficient documentation

## 2014-07-21 DIAGNOSIS — Z791 Long term (current) use of non-steroidal anti-inflammatories (NSAID): Secondary | ICD-10-CM | POA: Diagnosis not present

## 2014-07-21 DIAGNOSIS — Z8619 Personal history of other infectious and parasitic diseases: Secondary | ICD-10-CM | POA: Diagnosis not present

## 2014-07-21 DIAGNOSIS — R5383 Other fatigue: Secondary | ICD-10-CM

## 2014-07-21 DIAGNOSIS — F329 Major depressive disorder, single episode, unspecified: Secondary | ICD-10-CM | POA: Diagnosis not present

## 2014-07-21 DIAGNOSIS — G43909 Migraine, unspecified, not intractable, without status migrainosus: Secondary | ICD-10-CM | POA: Diagnosis not present

## 2014-07-21 LAB — CBC WITH DIFFERENTIAL/PLATELET
Basophils Absolute: 0 10*3/uL (ref 0.0–0.1)
Basophils Relative: 0 % (ref 0–1)
Eosinophils Absolute: 0.1 10*3/uL (ref 0.0–0.7)
Eosinophils Relative: 1 % (ref 0–5)
HCT: 41.7 % (ref 36.0–46.0)
Hemoglobin: 14 g/dL (ref 12.0–15.0)
Lymphocytes Relative: 29 % (ref 12–46)
Lymphs Abs: 2.2 10*3/uL (ref 0.7–4.0)
MCH: 30.6 pg (ref 26.0–34.0)
MCHC: 33.6 g/dL (ref 30.0–36.0)
MCV: 91 fL (ref 78.0–100.0)
Monocytes Absolute: 0.6 10*3/uL (ref 0.1–1.0)
Monocytes Relative: 9 % (ref 3–12)
Neutro Abs: 4.6 10*3/uL (ref 1.7–7.7)
Neutrophils Relative %: 61 % (ref 43–77)
Platelets: 203 10*3/uL (ref 150–400)
RBC: 4.58 MIL/uL (ref 3.87–5.11)
RDW: 13.1 % (ref 11.5–15.5)
WBC: 7.5 10*3/uL (ref 4.0–10.5)

## 2014-07-21 LAB — BASIC METABOLIC PANEL
Anion gap: 13 (ref 5–15)
BUN: 8 mg/dL (ref 6–23)
CO2: 24 mEq/L (ref 19–32)
Calcium: 8.6 mg/dL (ref 8.4–10.5)
Chloride: 101 mEq/L (ref 96–112)
Creatinine, Ser: 0.78 mg/dL (ref 0.50–1.10)
GFR calc Af Amer: >90 mL/min (ref >90)
GFR calc non Af Amer: >90 mL/min (ref >90)
Glucose, Bld: 103 mg/dL — ABNORMAL HIGH (ref 70–99)
Potassium: 3.9 mEq/L (ref 3.7–5.3)
Sodium: 138 mEq/L (ref 137–147)

## 2014-07-21 LAB — I-STAT TROPONIN, ED: Troponin i, poc: 0 ng/mL (ref 0.00–0.08)

## 2014-07-21 MED ORDER — MECLIZINE HCL 25 MG PO TABS
25.0000 mg | ORAL_TABLET | Freq: Once | ORAL | Status: AC
  Administered 2014-07-21: 25 mg via ORAL
  Filled 2014-07-21: qty 1

## 2014-07-21 MED ORDER — MORPHINE SULFATE 4 MG/ML IJ SOLN
4.0000 mg | Freq: Once | INTRAMUSCULAR | Status: AC
  Administered 2014-07-21: 4 mg via INTRAVENOUS
  Filled 2014-07-21: qty 1

## 2014-07-21 MED ORDER — MECLIZINE HCL 50 MG PO TABS: 25.0000 mg | ORAL_TABLET | Freq: Two times a day (BID) | ORAL | Status: DC | PRN

## 2014-07-21 MED ORDER — GI COCKTAIL ~~LOC~~
30.0000 mL | Freq: Once | ORAL | Status: AC
  Administered 2014-07-21: 30 mL via ORAL
  Filled 2014-07-21: qty 30

## 2014-07-21 MED ORDER — LORAZEPAM 1 MG PO TABS
1.0000 mg | ORAL_TABLET | Freq: Once | ORAL | Status: AC
  Administered 2014-07-21: 1 mg via ORAL
  Filled 2014-07-21: qty 1

## 2014-07-21 NOTE — ED Notes (Addendum)
Cp lightheaded aND DIZZY W/ CHILLS AND CRYING A LOT NO ENGERY
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
## 2014-07-21 NOTE — ED Notes (Signed)
Pt reports she is unable to ambulate very far due to dizziness.  Pt ambulated approximately 15 feet with assistance with pulse ox at 98% with pt on room air.

## 2014-07-22 ENCOUNTER — Other Ambulatory Visit: Payer: Medicaid Other

## 2014-07-22 NOTE — ED Provider Notes (Signed)
Medical screening examination/treatment/procedure(s) were conducted as a shared visit with non-physician practitioner(s) and myself.  I personally evaluated the patient during the encounter.  Patient is a 43 year old female with history of asthma, chronic low back pain, migraines. She presents with complaints of headache, chest pain, abdominal pain, and generalized weakness for the past several days. She states that she is dizzy when she gets up to walk. She feels lightheaded and does not describe this as a spinning sensation. She denies any fevers or chills. She denies any difficulty breathing.  On exam, vitals are stable the patient is afebrile. Head is atraumatic, normocephalic neck is supple. Heart is regular rate and rhythm. There are no murmurs. Lungs are clear and equal. Abdomen is soft, nontender. Neurologic exam reveals equal pupils and no cranial nerve deficits.  Patient presents with multiple complaints with a negative workup. This does not sound cardiac and I found nothing else to explain her symptoms. She is given medications in the ER and appears to be feeling somewhat better. She was ambulated and did well, however did complain of some dizziness. She will be discharged to home to followup with her primary Dr. She will be given meclizine as her symptoms may well be related to a peripheral vertigo.   EKG Interpretation   Date/Time:  Thursday July 21 2014 13:08:47 EDT Ventricular Rate:  69 PR Interval:  158 QRS Duration: 90 QT Interval:  384 QTC Calculation: 411 R Axis:   27 Text Interpretation:  Normal sinus rhythm Low voltage QRS Cannot rule out  Anterior infarct , age undetermined Abnormal ECG Confirmed by Beau Fanny  MD,  Solomiya Pascale ([id number]) on 07/22/2014 2:57:40 PM       Veryl Speak, MD 07/22/14 1500

## 2014-07-27 ENCOUNTER — Telehealth: Payer: Self-pay | Admitting: *Deleted

## 2014-07-27 ENCOUNTER — Ambulatory Visit (INDEPENDENT_AMBULATORY_CARE_PROVIDER_SITE_OTHER): Payer: Self-pay | Admitting: Neurology

## 2014-07-27 ENCOUNTER — Encounter (HOSPITAL_COMMUNITY): Payer: Self-pay | Admitting: Emergency Medicine

## 2014-07-27 ENCOUNTER — Emergency Department (HOSPITAL_COMMUNITY)
Admission: EM | Admit: 2014-07-27 | Discharge: 2014-07-27 | Disposition: A | Discharge status: Home / Self Care | Payer: Medicaid Other | Attending: Emergency Medicine | Admitting: Emergency Medicine

## 2014-07-27 ENCOUNTER — Encounter: Payer: Self-pay | Admitting: Neurology

## 2014-07-27 ENCOUNTER — Emergency Department (HOSPITAL_COMMUNITY): Payer: Medicaid Other

## 2014-07-27 VITALS — BP 113/80 | HR 86 | Ht 65.5 in | Wt 245.2 lb

## 2014-07-27 DIAGNOSIS — R5383 Other fatigue: Secondary | ICD-10-CM

## 2014-07-27 DIAGNOSIS — G8929 Other chronic pain: Secondary | ICD-10-CM | POA: Insufficient documentation

## 2014-07-27 DIAGNOSIS — Z87891 Personal history of nicotine dependence: Secondary | ICD-10-CM | POA: Diagnosis not present

## 2014-07-27 DIAGNOSIS — J45909 Unspecified asthma, uncomplicated: Secondary | ICD-10-CM | POA: Insufficient documentation

## 2014-07-27 DIAGNOSIS — Z791 Long term (current) use of non-steroidal anti-inflammatories (NSAID): Secondary | ICD-10-CM | POA: Insufficient documentation

## 2014-07-27 DIAGNOSIS — Z8619 Personal history of other infectious and parasitic diseases: Secondary | ICD-10-CM | POA: Insufficient documentation

## 2014-07-27 DIAGNOSIS — R079 Chest pain, unspecified: Secondary | ICD-10-CM | POA: Insufficient documentation

## 2014-07-27 DIAGNOSIS — Z8659 Personal history of other mental and behavioral disorders: Secondary | ICD-10-CM | POA: Diagnosis not present

## 2014-07-27 DIAGNOSIS — K219 Gastro-esophageal reflux disease without esophagitis: Secondary | ICD-10-CM | POA: Insufficient documentation

## 2014-07-27 DIAGNOSIS — G43109 Migraine with aura, not intractable, without status migrainosus: Secondary | ICD-10-CM | POA: Diagnosis not present

## 2014-07-27 DIAGNOSIS — R51 Headache: Secondary | ICD-10-CM

## 2014-07-27 DIAGNOSIS — M129 Arthropathy, unspecified: Secondary | ICD-10-CM | POA: Insufficient documentation

## 2014-07-27 DIAGNOSIS — Z79899 Other long term (current) drug therapy: Secondary | ICD-10-CM | POA: Insufficient documentation

## 2014-07-27 DIAGNOSIS — R5381 Other malaise: Secondary | ICD-10-CM | POA: Insufficient documentation

## 2014-07-27 LAB — CBC
HCT: 44.6 % (ref 36.0–46.0)
Hemoglobin: 14.8 g/dL (ref 12.0–15.0)
MCH: 30.8 pg (ref 26.0–34.0)
MCHC: 33.2 g/dL (ref 30.0–36.0)
MCV: 92.9 fL (ref 78.0–100.0)
Platelets: 182 10*3/uL (ref 150–400)
RBC: 4.8 MIL/uL (ref 3.87–5.11)
RDW: 13.2 % (ref 11.5–15.5)
WBC: 7.9 10*3/uL (ref 4.0–10.5)

## 2014-07-27 LAB — BASIC METABOLIC PANEL
Anion gap: 17 — ABNORMAL HIGH (ref 5–15)
BUN: 11 mg/dL (ref 6–23)
CO2: 22 mEq/L (ref 19–32)
Calcium: 9.2 mg/dL (ref 8.4–10.5)
Chloride: 101 mEq/L (ref 96–112)
Creatinine, Ser: 0.93 mg/dL (ref 0.50–1.10)
GFR calc Af Amer: 85 mL/min — ABNORMAL LOW (ref >90)
GFR, EST NON AFRICAN AMERICAN: 73 mL/min — AB (ref >90)
Glucose, Bld: 98 mg/dL (ref 70–99)
POTASSIUM: 4 meq/L (ref 3.7–5.3)
Sodium: 140 mEq/L (ref 137–147)

## 2014-07-27 LAB — I-STAT TROPONIN, ED: Troponin i, poc: 0 ng/mL (ref 0.00–0.08)

## 2014-07-27 LAB — PRO B NATRIURETIC PEPTIDE: PRO B NATRI PEPTIDE: 8.5 pg/mL (ref 0–125)

## 2014-07-27 MED ORDER — FENTANYL CITRATE 0.05 MG/ML IJ SOLN
50.0000 ug | Freq: Once | INTRAMUSCULAR | Status: AC
  Administered 2014-07-27: 50 ug via NASAL

## 2014-07-27 MED ORDER — OXYCODONE-ACETAMINOPHEN 5-325 MG PO TABS
1.0000 | ORAL_TABLET | Freq: Once | ORAL | Status: AC
  Administered 2014-07-27: 1 via ORAL
  Filled 2014-07-27 (×2): qty 1

## 2014-07-27 MED ORDER — LORAZEPAM 2 MG/ML IJ SOLN
1.0000 mg | Freq: Once | INTRAMUSCULAR | Status: AC
  Administered 2014-07-27: 1 mg via INTRAVENOUS
  Filled 2014-07-27: qty 1

## 2014-07-27 MED ORDER — LORAZEPAM 1 MG PO TABS: 1.0000 mg | ORAL_TABLET | Freq: Three times a day (TID) | ORAL | Status: DC | PRN

## 2014-07-27 MED ORDER — ONDANSETRON 4 MG PO TBDP: 4.0000 mg | ORAL_TABLET | Freq: Three times a day (TID) | ORAL | Status: DC | PRN

## 2014-07-27 MED ORDER — KETOROLAC TROMETHAMINE 30 MG/ML IJ SOLN
30.0000 mg | Freq: Once | INTRAMUSCULAR | Status: AC
  Administered 2014-07-27: 30 mg via INTRAVENOUS
  Filled 2014-07-27: qty 1

## 2014-07-27 MED ORDER — FENTANYL CITRATE 0.05 MG/ML IJ SOLN
INTRAMUSCULAR | Status: AC
  Filled 2014-07-27: qty 2
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
## 2014-07-27 NOTE — ED Notes (Signed)
MD at bedside. 

## 2014-07-27 NOTE — ED Notes (Addendum)
Pt was at neurologist office today for checkup regarding neuropathy in her legs. Pt then began having mid chest pains that increase with movement. "feels like a lump in her throat and difficulty swallowing." airway is intact at triage. Reports right arm feels weaker, grips equal at triage. ekg done.reports this all started after an anxiety attack at the dr office.

## 2014-07-27 NOTE — Discharge Instructions (Signed)
As discussed, it is important that you follow up as soon as possible with your physician for continued management of your condition.  Please be sure to discuss your chest pain exacerbations, as well as her ongoing stressful situation.  He may consider the addition of an anti-anxiety medication.  If you develop any new, or concerning changes in your condition, please return to the emergency department immediately.
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
## 2014-07-28 ENCOUNTER — Encounter: Payer: Self-pay | Admitting: Internal Medicine

## 2014-07-28 ENCOUNTER — Ambulatory Visit: Payer: Medicaid Other | Attending: Internal Medicine | Admitting: Internal Medicine

## 2014-07-28 VITALS — BP 109/73 | HR 86 | Temp 98.2°F | Resp 14 | Ht 65.5 in | Wt 247.4 lb

## 2014-07-28 DIAGNOSIS — D234 Other benign neoplasm of skin of scalp and neck: Secondary | ICD-10-CM

## 2014-07-28 DIAGNOSIS — K219 Gastro-esophageal reflux disease without esophagitis: Secondary | ICD-10-CM | POA: Diagnosis not present

## 2014-07-28 DIAGNOSIS — R0602 Shortness of breath: Secondary | ICD-10-CM | POA: Diagnosis not present

## 2014-07-28 DIAGNOSIS — M542 Cervicalgia: Secondary | ICD-10-CM | POA: Diagnosis not present

## 2014-07-28 DIAGNOSIS — M549 Dorsalgia, unspecified: Secondary | ICD-10-CM | POA: Diagnosis not present

## 2014-07-28 DIAGNOSIS — L723 Sebaceous cyst: Secondary | ICD-10-CM | POA: Diagnosis not present

## 2014-07-28 DIAGNOSIS — G8929 Other chronic pain: Secondary | ICD-10-CM | POA: Diagnosis not present

## 2014-07-28 DIAGNOSIS — F411 Generalized anxiety disorder: Secondary | ICD-10-CM | POA: Diagnosis present

## 2014-07-28 DIAGNOSIS — M129 Arthropathy, unspecified: Secondary | ICD-10-CM | POA: Insufficient documentation

## 2014-07-28 DIAGNOSIS — Z87891 Personal history of nicotine dependence: Secondary | ICD-10-CM | POA: Diagnosis not present

## 2014-07-28 MED ORDER — ALPRAZOLAM 0.25 MG PO TABS: 0.2500 mg | ORAL_TABLET | Freq: Two times a day (BID) | ORAL | Status: DC | PRN

## 2014-07-28 NOTE — Progress Notes (Signed)
Pt left without being seen.

## 2014-07-28 NOTE — Progress Notes (Signed)
Patient presents for "anxiety attacks" States was seen in ED twice in last week for anxiety States did not stay for appt with neurologist yesterday because she was there for leg pain  but was told he was checking for headaches. Patient denies headaches States grandfather died 6 days ago Patient is crying in office
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
## 2014-08-09 ENCOUNTER — Other Ambulatory Visit: Payer: Medicaid Other

## 2014-08-10 ENCOUNTER — Ambulatory Visit: Payer: Self-pay | Admitting: Neurology

## 2014-08-13 ENCOUNTER — Encounter (HOSPITAL_COMMUNITY): Payer: Self-pay | Admitting: Emergency Medicine

## 2014-08-13 ENCOUNTER — Emergency Department (HOSPITAL_COMMUNITY): Payer: Medicaid Other

## 2014-08-13 ENCOUNTER — Emergency Department (HOSPITAL_COMMUNITY)
Admission: EM | Admit: 2014-08-13 | Discharge: 2014-08-13 | Disposition: A | Discharge status: Home / Self Care | Payer: Medicaid Other | Attending: Emergency Medicine | Admitting: Emergency Medicine

## 2014-08-13 DIAGNOSIS — S8990XA Unspecified injury of unspecified lower leg, initial encounter: Secondary | ICD-10-CM | POA: Insufficient documentation

## 2014-08-13 DIAGNOSIS — Z79899 Other long term (current) drug therapy: Secondary | ICD-10-CM | POA: Insufficient documentation

## 2014-08-13 DIAGNOSIS — Z87891 Personal history of nicotine dependence: Secondary | ICD-10-CM | POA: Insufficient documentation

## 2014-08-13 DIAGNOSIS — Z8619 Personal history of other infectious and parasitic diseases: Secondary | ICD-10-CM | POA: Diagnosis not present

## 2014-08-13 DIAGNOSIS — J45909 Unspecified asthma, uncomplicated: Secondary | ICD-10-CM | POA: Insufficient documentation

## 2014-08-13 DIAGNOSIS — S9000XA Contusion of unspecified ankle, initial encounter: Secondary | ICD-10-CM | POA: Diagnosis not present

## 2014-08-13 DIAGNOSIS — F411 Generalized anxiety disorder: Secondary | ICD-10-CM | POA: Diagnosis not present

## 2014-08-13 DIAGNOSIS — G8929 Other chronic pain: Secondary | ICD-10-CM | POA: Diagnosis not present

## 2014-08-13 DIAGNOSIS — K219 Gastro-esophageal reflux disease without esophagitis: Secondary | ICD-10-CM | POA: Diagnosis not present

## 2014-08-13 DIAGNOSIS — Z8679 Personal history of other diseases of the circulatory system: Secondary | ICD-10-CM | POA: Insufficient documentation

## 2014-08-13 DIAGNOSIS — S9001XA Contusion of right ankle, initial encounter: Secondary | ICD-10-CM

## 2014-08-13 DIAGNOSIS — Y9389 Activity, other specified: Secondary | ICD-10-CM | POA: Insufficient documentation

## 2014-08-13 DIAGNOSIS — Z791 Long term (current) use of non-steroidal anti-inflammatories (NSAID): Secondary | ICD-10-CM | POA: Diagnosis not present

## 2014-08-13 DIAGNOSIS — W2203XA Walked into furniture, initial encounter: Secondary | ICD-10-CM | POA: Diagnosis not present

## 2014-08-13 DIAGNOSIS — Y9289 Other specified places as the place of occurrence of the external cause: Secondary | ICD-10-CM | POA: Diagnosis not present

## 2014-08-13 DIAGNOSIS — S99929A Unspecified injury of unspecified foot, initial encounter: Secondary | ICD-10-CM

## 2014-08-13 DIAGNOSIS — M129 Arthropathy, unspecified: Secondary | ICD-10-CM | POA: Insufficient documentation

## 2014-08-13 DIAGNOSIS — S99919A Unspecified injury of unspecified ankle, initial encounter: Secondary | ICD-10-CM

## 2014-08-13 MED ORDER — HYDROCODONE-ACETAMINOPHEN 5-325 MG PO TABS
1.0000 | ORAL_TABLET | Freq: Once | ORAL | Status: AC
  Administered 2014-08-13: 1 via ORAL
  Filled 2014-08-13: qty 1

## 2014-08-13 MED ORDER — HYDROCODONE-ACETAMINOPHEN 5-325 MG PO TABS: 1.0000 | ORAL_TABLET | Freq: Four times a day (QID) | ORAL | Status: DC | PRN

## 2014-08-13 NOTE — ED Notes (Signed)
Pt reports she knocked her ankle on a bed while staying with her fiance who is a pt in the hospital.  Minor swelling noted.  Able to move ankle without difficulty.
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
## 2014-08-13 NOTE — ED Notes (Signed)
The pt is c/o rt ankle pain.  She struck her ankle on a corner of the furniture upstairs .  She is staying with her husband
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
## 2014-08-13 NOTE — Discharge Instructions (Signed)
Contusion °A contusion is a deep bruise. Contusions happen when an injury causes bleeding under the skin. Signs of bruising include pain, puffiness (swelling), and discolored skin. The contusion may turn blue, purple, or yellow. °HOME CARE  °· Put ice on the injured area. °· Put ice in a plastic bag. °· Place a towel between your skin and the bag. °· Leave the ice on for 15-20 minutes, 03-04 times a day. °· Only take medicine as told by your doctor. °· Rest the injured area. °· If possible, raise (elevate) the injured area to lessen puffiness. °GET HELP RIGHT AWAY IF:  °· You have more bruising or puffiness. °· You have pain that is getting worse. °· Your puffiness or pain is not helped by medicine. °MAKE SURE YOU:  °· Understand these instructions. °· Will watch your condition. °· Will get help right away if you are not doing well or get worse. °Document Released: 06/03/2008 Document Revised: 03/09/2012 Document Reviewed: 10/21/2011 °ExitCare® Patient Information ©2015 [employer]. This information is not intended to replace advice given to you by your health care provider. Make sure you discuss any questions you have with your health care provider. ° °Cryotherapy °Cryotherapy is when you put ice on your injury. Ice helps lessen pain and puffiness (swelling) after an injury. Ice works the best when you start using it in the first 24 to 48 hours after an injury. °HOME CARE °· Put a dry or damp towel between the ice pack and your skin. °· You may press gently on the ice pack. °· Leave the ice on for no more than 10 to 20 minutes at a time. °· Check your skin after 5 minutes to make sure your skin is okay. °· Rest at least 20 minutes between ice pack uses. °· Stop using ice when your skin loses feeling (numbness). °· Do not use ice on someone who cannot tell you when it hurts. This includes small children and people with memory problems (dementia). °GET HELP RIGHT AWAY IF: °· You have white spots on your  skin. °· Your skin turns blue or pale. °· Your skin feels waxy or hard. °· Your puffiness gets worse. °MAKE SURE YOU:  °· Understand these instructions. °· Will watch your condition. °· Will get help right away if you are not doing well or get worse. °Document Released: 06/03/2008 Document Revised: 03/09/2012 Document Reviewed: 08/08/2011 °ExitCare® Patient Information ©2015 [employer]. This information is not intended to replace advice given to you by your health care provider. Make sure you discuss any questions you have with your health care provider. ° °

## 2014-08-15 ENCOUNTER — Other Ambulatory Visit: Payer: Self-pay | Admitting: Internal Medicine

## 2014-08-15 DIAGNOSIS — F411 Generalized anxiety disorder: Secondary | ICD-10-CM

## 2014-08-15 NOTE — Telephone Encounter (Signed)
Pt was last seen in office on 07/28/14. Pt is req med refill (xanax) pls contact pt.

## 2014-08-16 NOTE — Telephone Encounter (Signed)
Please contact patient and let her know that I explained to her that no more Xanax will be filled from this office. If she is still having anxiety like we discussed then she will need to look at more long term management with meds such as lexapro or paxil. Thanks

## 2014-08-17 ENCOUNTER — Other Ambulatory Visit: Payer: Self-pay | Admitting: Internal Medicine

## 2014-08-19 ENCOUNTER — Telehealth: Payer: Self-pay | Admitting: Emergency Medicine

## 2014-08-19 ENCOUNTER — Telehealth: Payer: Self-pay | Admitting: Internal Medicine

## 2014-08-19 NOTE — Telephone Encounter (Signed)
Pt. Calling to request a refill on ALPRAZolam (XANAX) 0.25 MG tablet, pleaser f/u with pt.

## 2014-08-19 NOTE — Telephone Encounter (Signed)
Pt requesting medication refill Xanax. Please f/u

## 2014-08-21 NOTE — Telephone Encounter (Signed)
Please read previous office visit note. I have also responded to Salcha that I will not refill her request for Xanax. She was explained that on last office visit. She is welcome to look at long term management such as lexapro or zoloft.

## 2014-08-22 ENCOUNTER — Ambulatory Visit: Payer: Medicaid Other | Admitting: Obstetrics

## 2014-08-22 ENCOUNTER — Ambulatory Visit: Payer: Medicaid Other | Admitting: Neurology

## 2014-08-22 ENCOUNTER — Telehealth: Payer: Self-pay | Admitting: Emergency Medicine

## 2014-08-22 ENCOUNTER — Telehealth: Payer: Self-pay | Admitting: Internal Medicine

## 2014-08-22 MED ORDER — SERTRALINE HCL 50 MG PO TABS: 50.0000 mg | ORAL_TABLET | Freq: Every day | ORAL | Status: DC

## 2014-08-22 NOTE — Telephone Encounter (Signed)
Patients needs medication refills ASAP Please f/u with Pt

## 2014-08-22 NOTE — Telephone Encounter (Signed)
Spoke with pt in regards to medication refill Xanax. Pt informed Lexapro or Zoloft is the only choice for long term management per Mateo Flow, NP Pt requesting Zoloft with larger dose for tolerance.

## 2014-08-23 ENCOUNTER — Encounter: Payer: Self-pay | Admitting: Neurology

## 2014-08-24 ENCOUNTER — Ambulatory Visit: Payer: Medicaid Other | Admitting: Neurology

## 2014-08-31 ENCOUNTER — Emergency Department (HOSPITAL_COMMUNITY)
Admission: EM | Admit: 2014-08-31 | Discharge: 2014-08-31 | Disposition: A | Discharge status: Home / Self Care | Payer: Medicaid Other | Attending: Emergency Medicine | Admitting: Emergency Medicine

## 2014-08-31 ENCOUNTER — Emergency Department (HOSPITAL_COMMUNITY): Payer: Medicaid Other

## 2014-08-31 ENCOUNTER — Encounter (HOSPITAL_COMMUNITY): Payer: Self-pay | Admitting: Emergency Medicine

## 2014-08-31 DIAGNOSIS — M129 Arthropathy, unspecified: Secondary | ICD-10-CM | POA: Diagnosis not present

## 2014-08-31 DIAGNOSIS — F411 Generalized anxiety disorder: Secondary | ICD-10-CM | POA: Diagnosis not present

## 2014-08-31 DIAGNOSIS — G43909 Migraine, unspecified, not intractable, without status migrainosus: Secondary | ICD-10-CM | POA: Diagnosis not present

## 2014-08-31 DIAGNOSIS — M79609 Pain in unspecified limb: Secondary | ICD-10-CM | POA: Diagnosis present

## 2014-08-31 DIAGNOSIS — Z8619 Personal history of other infectious and parasitic diseases: Secondary | ICD-10-CM | POA: Diagnosis not present

## 2014-08-31 DIAGNOSIS — Z87828 Personal history of other (healed) physical injury and trauma: Secondary | ICD-10-CM | POA: Insufficient documentation

## 2014-08-31 DIAGNOSIS — J45909 Unspecified asthma, uncomplicated: Secondary | ICD-10-CM | POA: Insufficient documentation

## 2014-08-31 DIAGNOSIS — Z791 Long term (current) use of non-steroidal anti-inflammatories (NSAID): Secondary | ICD-10-CM | POA: Insufficient documentation

## 2014-08-31 DIAGNOSIS — F4310 Post-traumatic stress disorder, unspecified: Secondary | ICD-10-CM | POA: Insufficient documentation

## 2014-08-31 DIAGNOSIS — G8929 Other chronic pain: Secondary | ICD-10-CM | POA: Diagnosis not present

## 2014-08-31 DIAGNOSIS — Z87891 Personal history of nicotine dependence: Secondary | ICD-10-CM | POA: Diagnosis not present

## 2014-08-31 DIAGNOSIS — Z79899 Other long term (current) drug therapy: Secondary | ICD-10-CM | POA: Insufficient documentation

## 2014-08-31 DIAGNOSIS — M79672 Pain in left foot: Secondary | ICD-10-CM

## 2014-08-31 DIAGNOSIS — K219 Gastro-esophageal reflux disease without esophagitis: Secondary | ICD-10-CM | POA: Diagnosis not present

## 2014-08-31 MED ORDER — HYDROMORPHONE HCL PF 1 MG/ML IJ SOLN: 1.0000 mg | Freq: Once | INTRAMUSCULAR | Status: DC

## 2014-08-31 MED ORDER — KETOROLAC TROMETHAMINE 10 MG PO TABS: 10.0000 mg | ORAL_TABLET | Freq: Four times a day (QID) | ORAL | Status: DC | PRN

## 2014-08-31 MED ORDER — KETOROLAC TROMETHAMINE 60 MG/2ML IM SOLN
60.0000 mg | Freq: Once | INTRAMUSCULAR | Status: AC
  Administered 2014-08-31: 60 mg via INTRAMUSCULAR
  Filled 2014-08-31: qty 2
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
## 2014-08-31 NOTE — Discharge Instructions (Signed)
Continue percocet for pain. Ketorolac for additional pain relief. Keep foot elevated. Ice. Stay off of it. Follow up with orthopedics specialist.   Ganglion Cyst A ganglion cyst is a noncancerous, fluid-filled lump that occurs near joints or tendons. The ganglion cyst grows out of a joint or the lining of a tendon. It most often develops in the hand or wrist but can also develop in the shoulder, elbow, hip, knee, ankle, or foot. The round or oval ganglion can be pea sized or larger than a grape. Increased activity may enlarge the size of the cyst because more fluid starts to build up.  CAUSES  It is not completely known what causes a ganglion cyst to grow. However, it may be related to:  Inflammation or irritation around the joint.  An injury.  Repetitive movements or overuse.  Arthritis. SYMPTOMS  A lump most often appears in the hand or wrist, but can occur in other areas of the body. Generally, the lump is painless without other symptoms. However, sometimes pain can be felt during activity or when pressure is applied to the lump. The lump may even be tender to the touch. Tingling, pain, numbness, or muscle weakness can occur if the ganglion cyst presses on a nerve. Your grip may be weak and you may have less movement in your joints.  DIAGNOSIS  Ganglion cysts are most often diagnosed based on a physical exam, noting where the cyst is and how it looks. Your caregiver will feel the lump and may shine a light alongside it. If it is a ganglion, a light often shines through it. Your caregiver may order an X-ray, ultrasound, or MRI to rule out other conditions. TREATMENT  Ganglions usually go away on their own without treatment. If pain or other symptoms are involved, treatment may be needed. Treatment is also needed if the ganglion limits your movement or if it gets infected. Treatment options include:  Wearing a wrist or finger brace or splint.  Taking anti-inflammatory medicine.  Draining  fluid from the lump with a needle (aspiration).  Injecting a steroid into the joint.  Surgery to remove the ganglion cyst and its stalk that is attached to the joint or tendon. However, ganglion cysts can grow back. HOME CARE INSTRUCTIONS   Do not press on the ganglion, poke it with a needle, or hit it with a heavy object. You may rub the lump gently and often. Sometimes fluid moves out of the cyst.  Only take medicines as directed by your caregiver.  Wear your brace or splint as directed by your caregiver. SEEK MEDICAL CARE IF:   Your ganglion becomes larger or more painful.  You have increased redness, red streaks, or swelling.  You have pus coming from the lump.  You have weakness or numbness in the affected area. MAKE SURE YOU:   Understand these instructions.  Will watch your condition.  Will get help right away if you are not doing well or get worse. Document Released: 12/13/2000 Document Revised: 09/09/2012 Document Reviewed: 02/09/2008 [facility] Patient Information 2015 [place], [place]. This information is not intended to replace advice given to you by your health care provider. Make sure you discuss any questions you have with your health care provider.

## 2014-08-31 NOTE — ED Provider Notes (Signed)
Medical screening examination/treatment/procedure(s) were performed by non-physician practitioner and as supervising physician I was immediately available for consultation/collaboration.  Ernestina Patches, MD 08/31/14 [phone]
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
## 2014-09-07 NOTE — Telephone Encounter (Signed)
Sharee Pimple addressed this on 8/24

## 2014-09-12 ENCOUNTER — Emergency Department (HOSPITAL_COMMUNITY)
Admission: EM | Admit: 2014-09-12 | Discharge: 2014-09-12 | Disposition: A | Discharge status: Home / Self Care | Payer: Medicaid Other

## 2014-09-12 NOTE — ED Notes (Signed)
Called X 1. Was informed by that patient stated that she was not waiting.

## 2014-09-27 ENCOUNTER — Ambulatory Visit: Payer: Medicaid Other | Attending: Internal Medicine | Admitting: Internal Medicine

## 2014-09-27 ENCOUNTER — Other Ambulatory Visit (HOSPITAL_COMMUNITY)
Admission: RE | Admit: 2014-09-27 | Discharge: 2014-09-27 | Disposition: A | Discharge status: Home / Self Care | Payer: Medicaid Other | Source: Ambulatory Visit | Attending: Internal Medicine | Admitting: Internal Medicine

## 2014-09-27 ENCOUNTER — Encounter: Payer: Self-pay | Admitting: Internal Medicine

## 2014-09-27 ENCOUNTER — Ambulatory Visit: Payer: Medicaid Other | Admitting: Obstetrics

## 2014-09-27 VITALS — BP 116/84 | HR 74 | Temp 98.0°F | Resp 16 | Ht 65.5 in | Wt 249.0 lb

## 2014-09-27 DIAGNOSIS — Z791 Long term (current) use of non-steroidal anti-inflammatories (NSAID): Secondary | ICD-10-CM | POA: Insufficient documentation

## 2014-09-27 DIAGNOSIS — Z202 Contact with and (suspected) exposure to infections with a predominantly sexual mode of transmission: Secondary | ICD-10-CM | POA: Diagnosis not present

## 2014-09-27 DIAGNOSIS — Z01419 Encounter for gynecological examination (general) (routine) without abnormal findings: Secondary | ICD-10-CM | POA: Insufficient documentation

## 2014-09-27 DIAGNOSIS — N760 Acute vaginitis: Secondary | ICD-10-CM | POA: Insufficient documentation

## 2014-09-27 DIAGNOSIS — Z23 Encounter for immunization: Secondary | ICD-10-CM | POA: Diagnosis not present

## 2014-09-27 DIAGNOSIS — M129 Arthropathy, unspecified: Secondary | ICD-10-CM | POA: Diagnosis not present

## 2014-09-27 DIAGNOSIS — Z79899 Other long term (current) drug therapy: Secondary | ICD-10-CM | POA: Diagnosis not present

## 2014-09-27 DIAGNOSIS — Z1151 Encounter for screening for human papillomavirus (HPV): Secondary | ICD-10-CM | POA: Insufficient documentation

## 2014-09-27 DIAGNOSIS — J45909 Unspecified asthma, uncomplicated: Secondary | ICD-10-CM | POA: Insufficient documentation

## 2014-09-27 DIAGNOSIS — F4310 Post-traumatic stress disorder, unspecified: Secondary | ICD-10-CM | POA: Insufficient documentation

## 2014-09-27 DIAGNOSIS — N899 Noninflammatory disorder of vagina, unspecified: Secondary | ICD-10-CM | POA: Diagnosis not present

## 2014-09-27 DIAGNOSIS — Z87891 Personal history of nicotine dependence: Secondary | ICD-10-CM | POA: Insufficient documentation

## 2014-09-27 DIAGNOSIS — L293 Anogenital pruritus, unspecified: Secondary | ICD-10-CM | POA: Insufficient documentation

## 2014-09-27 DIAGNOSIS — Z0000 Encounter for general adult medical examination without abnormal findings: Secondary | ICD-10-CM | POA: Insufficient documentation

## 2014-09-27 DIAGNOSIS — R87810 Cervical high risk human papillomavirus (HPV) DNA test positive: Secondary | ICD-10-CM | POA: Insufficient documentation

## 2014-09-27 DIAGNOSIS — Z113 Encounter for screening for infections with a predominantly sexual mode of transmission: Secondary | ICD-10-CM | POA: Insufficient documentation

## 2014-09-27 DIAGNOSIS — F411 Generalized anxiety disorder: Secondary | ICD-10-CM | POA: Diagnosis not present

## 2014-09-27 DIAGNOSIS — K219 Gastro-esophageal reflux disease without esophagitis: Secondary | ICD-10-CM | POA: Diagnosis not present

## 2014-09-27 DIAGNOSIS — Z124 Encounter for screening for malignant neoplasm of cervix: Secondary | ICD-10-CM

## 2014-09-27 MED ORDER — OMEPRAZOLE 20 MG PO CPDR: 20.0000 mg | DELAYED_RELEASE_CAPSULE | Freq: Every day | ORAL | Status: DC

## 2014-09-27 NOTE — Progress Notes (Signed)
Pt is here today for a physical and a pap smear.
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
## 2014-09-28 LAB — CERVICOVAGINAL ANCILLARY ONLY
Chlamydia: NEGATIVE
Neisseria Gonorrhea: NEGATIVE
WET PREP (BD AFFIRM): NEGATIVE
Wet Prep (BD Affirm): NEGATIVE
Wet Prep (BD Affirm): POSITIVE — AB

## 2014-09-28 LAB — CYTOLOGY - PAP

## 2014-10-03 ENCOUNTER — Telehealth: Payer: Self-pay | Admitting: Emergency Medicine

## 2014-10-03 NOTE — Telephone Encounter (Signed)
Pt given pap smear results. States she cant take Flagyl due to GI upset. Can we give her vaginal suppositories instead?or whatever you recommend

## 2014-10-03 NOTE — Telephone Encounter (Signed)
May send Metrogel,  1 applicator QHS for 7 days. Thanks

## 2014-10-03 NOTE — Telephone Encounter (Signed)
Message copied by Ricci Barker on Mon Oct 03, 2014  5:02 PM ------      Message from: Chari Manning A      Created: Thu Sep 29, 2014  1:59 PM       Please call patient and let her know that she was negative for STD's but she is positive for bacterial vaginosis.  Please send prescription of Flagyl 500 mg twice a day for 7 days. Please explained to patient no alcohol while taking this medication. ------

## 2014-10-04 ENCOUNTER — Telehealth: Payer: Self-pay | Admitting: Emergency Medicine

## 2014-10-04 ENCOUNTER — Other Ambulatory Visit: Payer: Self-pay | Admitting: Emergency Medicine

## 2014-10-04 MED ORDER — METRONIDAZOLE 0.75 % VA GEL: 1.0000 | Freq: Every day | VAGINAL | Status: DC

## 2014-10-04 NOTE — Telephone Encounter (Signed)
Message copied by Ricci Barker on Tue Oct 04, 2014  5:59 PM ------      Message from: Chari Manning A      Created: Tue Oct 04, 2014  2:17 PM       Positive for HPV. Please send her for a colpo. Thanks ------

## 2014-10-04 NOTE — Telephone Encounter (Signed)
Left message for pt that medication Flagyl PO changed to Gel application. She may pick medication at [facility]

## 2014-10-06 ENCOUNTER — Telehealth: Payer: Self-pay | Admitting: Emergency Medicine

## 2014-10-06 NOTE — Telephone Encounter (Signed)
Wants to know what will happen at her next visit. Will she able to drive?

## 2014-10-06 NOTE — Telephone Encounter (Signed)
Pt given scheduled appointment for Colpo procedure @ [facility] 10/20/14 @ 10 am Pt given direction,address and number for cancellation

## 2014-10-17 ENCOUNTER — Inpatient Hospital Stay (HOSPITAL_COMMUNITY)
Admission: AD | Admit: 2014-10-17 | Discharge: 2014-10-17 | Disposition: A | Discharge status: Home / Self Care | Payer: Medicaid Other | Source: Ambulatory Visit | Attending: Obstetrics | Admitting: Obstetrics

## 2014-10-17 ENCOUNTER — Encounter (HOSPITAL_COMMUNITY): Payer: Self-pay

## 2014-10-17 ENCOUNTER — Inpatient Hospital Stay (HOSPITAL_COMMUNITY): Payer: Medicaid Other

## 2014-10-17 DIAGNOSIS — R109 Unspecified abdominal pain: Secondary | ICD-10-CM

## 2014-10-17 DIAGNOSIS — Z87891 Personal history of nicotine dependence: Secondary | ICD-10-CM | POA: Diagnosis not present

## 2014-10-17 DIAGNOSIS — N949 Unspecified condition associated with female genital organs and menstrual cycle: Secondary | ICD-10-CM | POA: Insufficient documentation

## 2014-10-17 DIAGNOSIS — B373 Candidiasis of vulva and vagina: Secondary | ICD-10-CM

## 2014-10-17 DIAGNOSIS — B3731 Acute candidiasis of vulva and vagina: Secondary | ICD-10-CM

## 2014-10-17 LAB — URINALYSIS, ROUTINE W REFLEX MICROSCOPIC
Bilirubin Urine: NEGATIVE
GLUCOSE, UA: NEGATIVE mg/dL
KETONES UR: NEGATIVE mg/dL
Leukocytes, UA: NEGATIVE
NITRITE: NEGATIVE
PH: 6 (ref 5.0–8.0)
PROTEIN: 30 mg/dL — AB
Specific Gravity, Urine: >1.03 — ABNORMAL HIGH (ref 1.005–1.030)
Urobilinogen, UA: 0.2 mg/dL (ref 0.0–1.0)

## 2014-10-17 LAB — POCT PREGNANCY, URINE: Preg Test, Ur: NEGATIVE

## 2014-10-17 LAB — CBC
HCT: 40.9 % (ref 36.0–46.0)
HEMOGLOBIN: 13.6 g/dL (ref 12.0–15.0)
MCH: 30 pg (ref 26.0–34.0)
MCHC: 33.3 g/dL (ref 30.0–36.0)
MCV: 90.3 fL (ref 78.0–100.0)
PLATELETS: 193 10*3/uL (ref 150–400)
RBC: 4.53 MIL/uL (ref 3.87–5.11)
RDW: 13.3 % (ref 11.5–15.5)
WBC: 6.9 10*3/uL (ref 4.0–10.5)

## 2014-10-17 LAB — WET PREP, GENITAL
Clue Cells Wet Prep HPF POC: NONE SEEN
TRICH WET PREP: NONE SEEN

## 2014-10-17 LAB — URINE MICROSCOPIC-ADD ON

## 2014-10-17 LAB — HIV ANTIBODY (ROUTINE TESTING W REFLEX): HIV 1&2 Ab, 4th Generation: NONREACTIVE

## 2014-10-17 MED ORDER — FLUCONAZOLE 150 MG PO TABS
150.0000 mg | ORAL_TABLET | Freq: Once | ORAL | Status: AC
  Administered 2014-10-17: 150 mg via ORAL
  Filled 2014-10-17: qty 1

## 2014-10-17 MED ORDER — KETOROLAC TROMETHAMINE 60 MG/2ML IM SOLN
60.0000 mg | Freq: Once | INTRAMUSCULAR | Status: AC
  Administered 2014-10-17: 60 mg via INTRAMUSCULAR
  Filled 2014-10-17: qty 2

## 2014-10-17 NOTE — MAU Note (Signed)
Patient states she had an ablation in 2012. States she has had abdominal pain and spotting for about 2 weeks.

## 2014-10-17 NOTE — Discharge Instructions (Signed)
Abdominal Pain, Women °Abdominal (stomach, pelvic, or belly) pain can be caused by many things. It is important to tell your doctor: °· The location of the pain. °· Does it come and go or is it present all the time? °· Are there things that start the pain (eating certain foods, exercise)? °· Are there other symptoms associated with the pain (fever, nausea, vomiting, diarrhea)? °All of this is helpful to know when trying to find the cause of the pain. °CAUSES  °· Stomach: virus or bacteria infection, or ulcer. °· Intestine: appendicitis (inflamed appendix), regional ileitis (Crohn's disease), ulcerative colitis (inflamed colon), irritable bowel syndrome, diverticulitis (inflamed diverticulum of the colon), or cancer of the stomach or intestine. °· Gallbladder disease or stones in the gallbladder. °· Kidney disease, kidney stones, or infection. °· Pancreas infection or cancer. °· Fibromyalgia (pain disorder). °· Diseases of the female organs: °¨ Uterus: fibroid (non-cancerous) tumors or infection. °¨ Fallopian tubes: infection or tubal pregnancy. °¨ Ovary: cysts or tumors. °¨ Pelvic adhesions (scar tissue). °¨ Endometriosis (uterus lining tissue growing in the pelvis and on the pelvic organs). °¨ Pelvic congestion syndrome (female organs filling up with blood just before the menstrual period). °¨ Pain with the menstrual period. °¨ Pain with ovulation (producing an egg). °¨ Pain with an IUD (intrauterine device, birth control) in the uterus. °¨ Cancer of the female organs. °· Functional pain (pain not caused by a disease, may improve without treatment). °· Psychological pain. °· Depression. °DIAGNOSIS  °Your doctor will decide the seriousness of your pain by doing an examination. °· Blood tests. °· X-rays. °· Ultrasound. °· CT scan (computed tomography, special type of X-ray). °· MRI (magnetic resonance imaging). °· Cultures, for infection. °· Barium enema (dye inserted in the large intestine, to better view it with  X-rays). °· Colonoscopy (looking in intestine with a lighted tube). °· Laparoscopy (minor surgery, looking in abdomen with a lighted tube). °· Major abdominal exploratory surgery (looking in abdomen with a large incision). °TREATMENT  °The treatment will depend on the cause of the pain.  °· Many cases can be observed and treated at home. °· Over-the-counter medicines recommended by your caregiver. °· Prescription medicine. °· Antibiotics, for infection. °· Birth control pills, for painful periods or for ovulation pain. °· Hormone treatment, for endometriosis. °· Nerve blocking injections. °· Physical therapy. °· Antidepressants. °· Counseling with a psychologist or psychiatrist. °· Minor or major surgery. °HOME CARE INSTRUCTIONS  °· Do not take laxatives, unless directed by your caregiver. °· Take over-the-counter pain medicine only if ordered by your caregiver. Do not take aspirin because it can cause an upset stomach or bleeding. °· Try a clear liquid diet (broth or water) as ordered by your caregiver. Slowly move to a bland diet, as tolerated, if the pain is related to the stomach or intestine. °· Have a thermometer and take your temperature several times a day, and record it. °· Bed rest and sleep, if it helps the pain. °· Avoid sexual intercourse, if it causes pain. °· Avoid stressful situations. °· Keep your follow-up appointments and tests, as your caregiver orders. °· If the pain does not go away with medicine or surgery, you may try: °¨ Acupuncture. °¨ Relaxation exercises (yoga, meditation). °¨ Group therapy. °¨ Counseling. °SEEK MEDICAL CARE IF:  °· You notice certain foods cause stomach pain. °· Your home care treatment is not helping your pain. °· You need stronger pain medicine. °· You want your IUD removed. °· You feel faint or   lightheaded. °· You develop nausea and vomiting. °· You develop a rash. °· You are having side effects or an allergy to your medicine. °SEEK IMMEDIATE MEDICAL CARE IF:  °· Your  pain does not go away or gets worse. °· You have a fever. °· Your pain is felt only in portions of the abdomen. The right side could possibly be appendicitis. The left lower portion of the abdomen could be colitis or diverticulitis. °· You are passing blood in your stools (bright red or black tarry stools, with or without vomiting). °· You have blood in your urine. °· You develop chills, with or without a fever. °· You pass out. °MAKE SURE YOU:  °· Understand these instructions. °· Will watch your condition. °· Will get help right away if you are not doing well or get worse. °Document Released: 10/13/2007 Document Revised: 05/02/2014 Document Reviewed: 11/02/2009 °Exit[employer]® Patient Information ©2015 [employer]. This information is not intended to replace advice given to you by your health care provider. Make sure you discuss any questions you have with your health care provider. °Candida Infection °A Candida infection (also called yeast, fungus, and Monilia infection) is an overgrowth of yeast that can occur anywhere on the body. A yeast infection commonly occurs in warm, moist body areas. Usually, the infection remains localized but can spread to become a systemic infection. A yeast infection may be a sign of a more severe disease such as diabetes, leukemia, or AIDS. °A yeast infection can occur in both men and women. In women, Candida vaginitis is a vaginal infection. It is one of the most common causes of vaginitis. Men usually do not have symptoms or know they have an infection until other problems develop. Men may find out they have a yeast infection because their sex partner has a yeast infection. Uncircumcised men are more likely to get a yeast infection than circumcised men. This is because the uncircumcised glans is not exposed to air and does not remain as dry as that of a circumcised glans. Older adults may develop yeast infections around dentures. °CAUSES  °Women °· Antibiotics. °· Steroid medication  taken for a long time. °· Being overweight (obese). °· Diabetes. °· Poor immune condition. °· Certain serious medical conditions. °· Immune suppressive medications for organ transplant patients. °· Chemotherapy. °· Pregnancy. °· Menstruation. °· Stress and fatigue. °· Intravenous drug use. °· Oral contraceptives. °· Wearing tight-fitting clothes in the crotch area. °· Catching it from a sex partner who has a yeast infection. °· Spermicide. °· Intravenous, urinary, or other catheters. °Men °· Catching it from a sex partner who has a yeast infection. °· Having oral or anal sex with a person who has the infection. °· Spermicide. °· Diabetes. °· Antibiotics. °· Poor immune system. °· Medications that suppress the immune system. °· Intravenous drug use. °· Intravenous, urinary, or other catheters. °SYMPTOMS  °Women °· Thick, white vaginal discharge. °· Vaginal itching. °· Redness and swelling in and around the vagina. °· Irritation of the lips of the vagina and perineum. °· Blisters on the vaginal lips and perineum. °· Painful sexual intercourse. °· Low blood sugar (hypoglycemia). °· Painful urination. °· Bladder infections. °· Intestinal problems such as constipation, indigestion, bad breath, bloating, increase in gas, diarrhea, or loose stools. °Men °· Men may develop intestinal problems such as constipation, indigestion, bad breath, bloating, increase in gas, diarrhea, or loose stools. °· Dry, cracked skin on the penis with itching or discomfort. °· Jock itch. °· Dry, flaky skin. °·   Athlete's foot. °· Hypoglycemia. °DIAGNOSIS  °Women °· A history and an exam are performed. °· The discharge may be examined under a microscope. °· A culture may be taken of the discharge. °Men °· A history and an exam are performed. °· Any discharge from the penis or areas of cracked skin will be looked at under the microscope and cultured. °· Stool samples may be cultured. °TREATMENT  °Women °· Vaginal antifungal suppositories and  creams. °· Medicated creams to decrease irritation and itching on the outside of the vagina. °· Warm compresses to the perineal area to decrease swelling and discomfort. °· Oral antifungal medications. °· Medicated vaginal suppositories or cream for repeated or recurrent infections. °· Wash and dry the irritation areas before applying the cream. °· Eating yogurt with Lactobacillus may help with prevention and treatment. °· Sometimes painting the vagina with gentian violet solution may help if creams and suppositories do not work. °Men °· Antifungal creams and oral antifungal medications. °· Sometimes treatment must continue for 30 days after the symptoms go away to prevent recurrence. °HOME CARE INSTRUCTIONS  °Women °· Use cotton underwear and avoid tight-fitting clothing. °· Avoid colored, scented toilet paper and deodorant tampons or pads. °· Do not douche. °· Keep your diabetes under control. °· Finish all the prescribed medications. °· Keep your skin clean and dry. °· Consume milk or yogurt with Lactobacillus-active culture regularly. If you get frequent yeast infections and think that is what the infection is, there are over-the-counter medications that you can get. If the infection does not show healing in 3 days, talk to your caregiver. °· Tell your sex partner you have a yeast infection. Your partner may need treatment also, especially if your infection does not clear up or recurs. °Men °· Keep your skin clean and dry. °· Keep your diabetes under control. °· Finish all prescribed medications. °· Tell your sex partner that you have a yeast infection so he or she can be treated if necessary. °SEEK MEDICAL CARE IF:  °· Your symptoms do not clear up or worsen in one week after treatment. °· You have an oral temperature above 102° F (38.9° C). °· You have trouble swallowing or eating for a prolonged time. °· You develop blisters on and around your vagina. °· You develop vaginal bleeding and it is not your  menstrual period. °· You develop abdominal pain. °· You develop intestinal problems as mentioned above. °· You get weak or light-headed. °· You have painful or increased urination. °· You have pain during sexual intercourse. °MAKE SURE YOU:  °· Understand these instructions. °· Will watch your condition. °· Will get help right away if you are not doing well or get worse. °Document Released: 01/23/2005 Document Revised: 05/02/2014 Document Reviewed: 05/07/2010 °Exit[employer]® Patient Information ©2015 [employer]. This information is not intended to replace advice given to you by your health care provider. Make sure you discuss any questions you have with your health care provider. ° °
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
## 2014-10-17 NOTE — MAU Provider Note (Signed)
Attestation of Attending Supervision of Advanced Practitioner (PA/CNM/NP): Evaluation and management procedures were performed by the Advanced Practitioner under my supervision and collaboration.  I have reviewed the Advanced Practitioner's note and chart, and I agree with the management and plan.  Jacob Stinson, DO Attending Physician Faculty Practice, [facility]  

## 2014-10-18 LAB — GC/CHLAMYDIA PROBE AMP
CT PROBE, AMP APTIMA: NEGATIVE
GC Probe RNA: NEGATIVE

## 2014-10-20 ENCOUNTER — Ambulatory Visit: Payer: Medicaid Other

## 2014-10-20 ENCOUNTER — Telehealth: Payer: Self-pay | Admitting: Internal Medicine

## 2014-10-20 NOTE — Telephone Encounter (Signed)
Pt. Called stating that she went to the [facility] and they diagnosed her with a yeast infection, pt. Was told to call her PCP to get treatment. Pt would like doctor to call something in for her. Please f/u with pt.

## 2014-10-21 ENCOUNTER — Telehealth: Payer: Self-pay

## 2014-10-21 MED ORDER — FLUCONAZOLE 150 MG PO TABS: 150.0000 mg | ORAL_TABLET | Freq: Once | ORAL | Status: DC

## 2014-10-21 NOTE — Telephone Encounter (Signed)
Patient was seen in the ED for a yeast infection Still having issues and has not gone away Will send prescription for diflucan to pharmacy with one refill And patient is aware she needs an appointment to be seen in the office

## 2014-10-31 ENCOUNTER — Encounter (HOSPITAL_COMMUNITY): Payer: Self-pay

## 2014-11-09 ENCOUNTER — Encounter (HOSPITAL_COMMUNITY): Payer: Self-pay | Admitting: *Deleted

## 2014-11-09 ENCOUNTER — Emergency Department (HOSPITAL_COMMUNITY)
Admission: EM | Admit: 2014-11-09 | Discharge: 2014-11-09 | Disposition: A | Discharge status: Home / Self Care | Payer: Medicaid Other | Attending: Emergency Medicine | Admitting: Emergency Medicine

## 2014-11-09 DIAGNOSIS — Y998 Other external cause status: Secondary | ICD-10-CM | POA: Insufficient documentation

## 2014-11-09 DIAGNOSIS — J45909 Unspecified asthma, uncomplicated: Secondary | ICD-10-CM | POA: Diagnosis not present

## 2014-11-09 DIAGNOSIS — F419 Anxiety disorder, unspecified: Secondary | ICD-10-CM | POA: Diagnosis not present

## 2014-11-09 DIAGNOSIS — F4310 Post-traumatic stress disorder, unspecified: Secondary | ICD-10-CM | POA: Diagnosis not present

## 2014-11-09 DIAGNOSIS — Z79899 Other long term (current) drug therapy: Secondary | ICD-10-CM | POA: Insufficient documentation

## 2014-11-09 DIAGNOSIS — Y93H1 Activity, digging, shoveling and raking: Secondary | ICD-10-CM | POA: Insufficient documentation

## 2014-11-09 DIAGNOSIS — Z8619 Personal history of other infectious and parasitic diseases: Secondary | ICD-10-CM | POA: Insufficient documentation

## 2014-11-09 DIAGNOSIS — G43909 Migraine, unspecified, not intractable, without status migrainosus: Secondary | ICD-10-CM | POA: Insufficient documentation

## 2014-11-09 DIAGNOSIS — X58XXXA Exposure to other specified factors, initial encounter: Secondary | ICD-10-CM | POA: Insufficient documentation

## 2014-11-09 DIAGNOSIS — Z87891 Personal history of nicotine dependence: Secondary | ICD-10-CM | POA: Insufficient documentation

## 2014-11-09 DIAGNOSIS — S66912A Strain of unspecified muscle, fascia and tendon at wrist and hand level, left hand, initial encounter: Secondary | ICD-10-CM

## 2014-11-09 DIAGNOSIS — K219 Gastro-esophageal reflux disease without esophagitis: Secondary | ICD-10-CM | POA: Diagnosis not present

## 2014-11-09 DIAGNOSIS — M1990 Unspecified osteoarthritis, unspecified site: Secondary | ICD-10-CM | POA: Diagnosis not present

## 2014-11-09 DIAGNOSIS — Z791 Long term (current) use of non-steroidal anti-inflammatories (NSAID): Secondary | ICD-10-CM | POA: Insufficient documentation

## 2014-11-09 DIAGNOSIS — Y92007 Garden or yard of unspecified non-institutional (private) residence as the place of occurrence of the external cause: Secondary | ICD-10-CM | POA: Insufficient documentation

## 2014-11-09 DIAGNOSIS — G8929 Other chronic pain: Secondary | ICD-10-CM | POA: Insufficient documentation

## 2014-11-09 DIAGNOSIS — S3992XA Unspecified injury of lower back, initial encounter: Secondary | ICD-10-CM | POA: Diagnosis present

## 2014-11-09 DIAGNOSIS — S39012A Strain of muscle, fascia and tendon of lower back, initial encounter: Secondary | ICD-10-CM

## 2014-11-09 MED ORDER — MELOXICAM 7.5 MG PO TABS: 7.5000 mg | ORAL_TABLET | Freq: Every day | ORAL | Status: DC

## 2014-11-09 NOTE — ED Notes (Signed)
Pt was gone when PA went in to speak with her.

## 2014-11-09 NOTE — ED Notes (Signed)
Pt reports raking the yard and now having lower back pain and left wrist pain. No acute distress noted at triage.

## 2014-11-09 NOTE — ED Notes (Signed)
Requesting to speak to PA about wrist splint.
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
## 2014-11-16 ENCOUNTER — Emergency Department (HOSPITAL_COMMUNITY)
Admission: EM | Admit: 2014-11-16 | Discharge: 2014-11-16 | Disposition: A | Discharge status: Home / Self Care | Payer: Medicaid Other | Attending: Emergency Medicine | Admitting: Emergency Medicine

## 2014-11-16 ENCOUNTER — Encounter (HOSPITAL_COMMUNITY): Payer: Self-pay | Admitting: *Deleted

## 2014-11-16 ENCOUNTER — Emergency Department (HOSPITAL_COMMUNITY): Payer: Medicaid Other

## 2014-11-16 DIAGNOSIS — J45901 Unspecified asthma with (acute) exacerbation: Secondary | ICD-10-CM | POA: Insufficient documentation

## 2014-11-16 DIAGNOSIS — J040 Acute laryngitis: Secondary | ICD-10-CM

## 2014-11-16 DIAGNOSIS — K219 Gastro-esophageal reflux disease without esophagitis: Secondary | ICD-10-CM | POA: Diagnosis not present

## 2014-11-16 DIAGNOSIS — Z87891 Personal history of nicotine dependence: Secondary | ICD-10-CM | POA: Diagnosis not present

## 2014-11-16 DIAGNOSIS — F419 Anxiety disorder, unspecified: Secondary | ICD-10-CM | POA: Insufficient documentation

## 2014-11-16 DIAGNOSIS — R0981 Nasal congestion: Secondary | ICD-10-CM | POA: Diagnosis present

## 2014-11-16 DIAGNOSIS — G43909 Migraine, unspecified, not intractable, without status migrainosus: Secondary | ICD-10-CM | POA: Diagnosis not present

## 2014-11-16 DIAGNOSIS — R059 Cough, unspecified: Secondary | ICD-10-CM

## 2014-11-16 DIAGNOSIS — F4310 Post-traumatic stress disorder, unspecified: Secondary | ICD-10-CM | POA: Diagnosis not present

## 2014-11-16 DIAGNOSIS — Z79899 Other long term (current) drug therapy: Secondary | ICD-10-CM | POA: Insufficient documentation

## 2014-11-16 DIAGNOSIS — G8929 Other chronic pain: Secondary | ICD-10-CM | POA: Insufficient documentation

## 2014-11-16 DIAGNOSIS — Z8619 Personal history of other infectious and parasitic diseases: Secondary | ICD-10-CM | POA: Diagnosis not present

## 2014-11-16 DIAGNOSIS — J40 Bronchitis, not specified as acute or chronic: Secondary | ICD-10-CM

## 2014-11-16 DIAGNOSIS — Z791 Long term (current) use of non-steroidal anti-inflammatories (NSAID): Secondary | ICD-10-CM | POA: Diagnosis not present

## 2014-11-16 DIAGNOSIS — M1990 Unspecified osteoarthritis, unspecified site: Secondary | ICD-10-CM | POA: Diagnosis not present

## 2014-11-16 DIAGNOSIS — R05 Cough: Secondary | ICD-10-CM

## 2014-11-16 LAB — COMPREHENSIVE METABOLIC PANEL
ALT: 51 U/L — ABNORMAL HIGH (ref 0–35)
AST: 34 U/L (ref 0–37)
Albumin: 3.7 g/dL (ref 3.5–5.2)
Alkaline Phosphatase: 75 U/L (ref 39–117)
Anion gap: 11 (ref 5–15)
BILIRUBIN TOTAL: 0.3 mg/dL (ref 0.3–1.2)
BUN: 9 mg/dL (ref 6–23)
CHLORIDE: 104 meq/L (ref 96–112)
CO2: 25 mEq/L (ref 19–32)
Calcium: 9 mg/dL (ref 8.4–10.5)
Creatinine, Ser: 0.89 mg/dL (ref 0.50–1.10)
GFR, EST AFRICAN AMERICAN: 89 mL/min — AB (ref >90)
GFR, EST NON AFRICAN AMERICAN: 77 mL/min — AB (ref >90)
GLUCOSE: 108 mg/dL — AB (ref 70–99)
Potassium: 4.2 mEq/L (ref 3.7–5.3)
Sodium: 140 mEq/L (ref 137–147)
Total Protein: 7 g/dL (ref 6.0–8.3)

## 2014-11-16 LAB — CBC WITH DIFFERENTIAL/PLATELET
Basophils Absolute: 0 10*3/uL (ref 0.0–0.1)
Basophils Relative: 0 % (ref 0–1)
EOS PCT: 2 % (ref 0–5)
Eosinophils Absolute: 0.2 10*3/uL (ref 0.0–0.7)
HEMATOCRIT: 41.8 % (ref 36.0–46.0)
HEMOGLOBIN: 13.7 g/dL (ref 12.0–15.0)
LYMPHS PCT: 25 % (ref 12–46)
Lymphs Abs: 2.5 10*3/uL (ref 0.7–4.0)
MCH: 30.2 pg (ref 26.0–34.0)
MCHC: 32.8 g/dL (ref 30.0–36.0)
MCV: 92.3 fL (ref 78.0–100.0)
MONOS PCT: 8 % (ref 3–12)
Monocytes Absolute: 0.8 10*3/uL (ref 0.1–1.0)
Neutro Abs: 6.5 10*3/uL (ref 1.7–7.7)
Neutrophils Relative %: 65 % (ref 43–77)
Platelets: 217 10*3/uL (ref 150–400)
RBC: 4.53 MIL/uL (ref 3.87–5.11)
RDW: 13.2 % (ref 11.5–15.5)
WBC: 9.9 10*3/uL (ref 4.0–10.5)

## 2014-11-16 LAB — RAPID STREP SCREEN (MED CTR MEBANE ONLY): STREPTOCOCCUS, GROUP A SCREEN (DIRECT): NEGATIVE

## 2014-11-16 MED ORDER — AZITHROMYCIN 250 MG PO TABS: 250.0000 mg | ORAL_TABLET | Freq: Every day | ORAL | Status: DC

## 2014-11-16 MED ORDER — SALINE SPRAY 0.65 % NA SOLN: 1.0000 | NASAL | Status: DC | PRN

## 2014-11-16 MED ORDER — BENZONATATE 100 MG PO CAPS: 100.0000 mg | ORAL_CAPSULE | Freq: Three times a day (TID) | ORAL | Status: DC

## 2014-11-16 MED ORDER — PREDNISONE 20 MG PO TABS: ORAL_TABLET | ORAL | Status: DC

## 2014-11-16 MED ORDER — MAGIC MOUTHWASH W/LIDOCAINE: 10.0000 mL | Freq: Three times a day (TID) | ORAL | Status: DC | PRN
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
## 2014-11-16 NOTE — ED Notes (Signed)
Since sat. Difficult talking, throat absolutely raw, congested, productive cough - thick green.

## 2014-11-18 LAB — CULTURE, GROUP A STREP

## 2014-12-27 ENCOUNTER — Telehealth: Payer: Self-pay | Admitting: Internal Medicine

## 2014-12-27 NOTE — Telephone Encounter (Signed)
Pt calling for refill on benzonatate (TESSALON) 100 MG capsule and omeprazole (PRILOSEC) 20 MG capsule, says she has been sick for a week so she has scheduled an appt on 12/28/14.

## 2014-12-28 ENCOUNTER — Ambulatory Visit: Payer: Medicaid Other | Admitting: Internal Medicine

## 2014-12-28 ENCOUNTER — Other Ambulatory Visit: Payer: Self-pay | Admitting: Internal Medicine

## 2015-01-05 ENCOUNTER — Ambulatory Visit: Payer: Medicaid Other | Admitting: Internal Medicine

## 2015-01-13 ENCOUNTER — Telehealth: Payer: Self-pay | Admitting: Internal Medicine

## 2015-01-13 NOTE — Telephone Encounter (Signed)
Pt advised to make apt with PCP for evaluation

## 2015-01-13 NOTE — Telephone Encounter (Signed)
Patient called stating that she was prescribed a medication to treat a vaginal infection but it has not cleared up the infection and would like some more medication to treat. Patient uses [facility] on [address]. Please f/u with pt.

## 2015-01-17 ENCOUNTER — Emergency Department (HOSPITAL_COMMUNITY)
Admission: EM | Admit: 2015-01-17 | Discharge: 2015-01-17 | Disposition: A | Discharge status: Home / Self Care | Payer: Medicaid Other | Attending: Emergency Medicine | Admitting: Emergency Medicine

## 2015-01-17 ENCOUNTER — Encounter (HOSPITAL_COMMUNITY): Payer: Self-pay | Admitting: Physical Medicine and Rehabilitation

## 2015-01-17 DIAGNOSIS — J45909 Unspecified asthma, uncomplicated: Secondary | ICD-10-CM | POA: Diagnosis not present

## 2015-01-17 DIAGNOSIS — Z7952 Long term (current) use of systemic steroids: Secondary | ICD-10-CM | POA: Diagnosis not present

## 2015-01-17 DIAGNOSIS — Z792 Long term (current) use of antibiotics: Secondary | ICD-10-CM | POA: Diagnosis not present

## 2015-01-17 DIAGNOSIS — F419 Anxiety disorder, unspecified: Secondary | ICD-10-CM | POA: Insufficient documentation

## 2015-01-17 DIAGNOSIS — Z87891 Personal history of nicotine dependence: Secondary | ICD-10-CM | POA: Diagnosis not present

## 2015-01-17 DIAGNOSIS — R51 Headache: Secondary | ICD-10-CM | POA: Diagnosis present

## 2015-01-17 DIAGNOSIS — R197 Diarrhea, unspecified: Secondary | ICD-10-CM | POA: Insufficient documentation

## 2015-01-17 DIAGNOSIS — K219 Gastro-esophageal reflux disease without esophagitis: Secondary | ICD-10-CM | POA: Diagnosis not present

## 2015-01-17 DIAGNOSIS — J0100 Acute maxillary sinusitis, unspecified: Secondary | ICD-10-CM | POA: Insufficient documentation

## 2015-01-17 DIAGNOSIS — R1031 Right lower quadrant pain: Secondary | ICD-10-CM | POA: Insufficient documentation

## 2015-01-17 DIAGNOSIS — G43909 Migraine, unspecified, not intractable, without status migrainosus: Secondary | ICD-10-CM | POA: Insufficient documentation

## 2015-01-17 DIAGNOSIS — Z79899 Other long term (current) drug therapy: Secondary | ICD-10-CM | POA: Insufficient documentation

## 2015-01-17 DIAGNOSIS — Z791 Long term (current) use of non-steroidal anti-inflammatories (NSAID): Secondary | ICD-10-CM | POA: Diagnosis not present

## 2015-01-17 DIAGNOSIS — F4310 Post-traumatic stress disorder, unspecified: Secondary | ICD-10-CM | POA: Diagnosis not present

## 2015-01-17 DIAGNOSIS — Z8619 Personal history of other infectious and parasitic diseases: Secondary | ICD-10-CM | POA: Insufficient documentation

## 2015-01-17 DIAGNOSIS — M1389 Other specified arthritis, multiple sites: Secondary | ICD-10-CM | POA: Diagnosis not present

## 2015-01-17 DIAGNOSIS — G8929 Other chronic pain: Secondary | ICD-10-CM | POA: Diagnosis not present

## 2015-01-17 LAB — URINALYSIS, ROUTINE W REFLEX MICROSCOPIC
Bilirubin Urine: NEGATIVE
GLUCOSE, UA: NEGATIVE mg/dL
Ketones, ur: NEGATIVE mg/dL
Nitrite: NEGATIVE
PH: 5.5 (ref 5.0–8.0)
PROTEIN: NEGATIVE mg/dL
Specific Gravity, Urine: 1.029 (ref 1.005–1.030)
UROBILINOGEN UA: 0.2 mg/dL (ref 0.0–1.0)

## 2015-01-17 LAB — BASIC METABOLIC PANEL
ANION GAP: 8 (ref 5–15)
BUN: 11 mg/dL (ref 6–23)
CALCIUM: 8.7 mg/dL (ref 8.4–10.5)
CHLORIDE: 107 meq/L (ref 96–112)
CO2: 22 mmol/L (ref 19–32)
Creatinine, Ser: 0.97 mg/dL (ref 0.50–1.10)
GFR calc Af Amer: 81 mL/min — ABNORMAL LOW (ref >90)
GFR, EST NON AFRICAN AMERICAN: 69 mL/min — AB (ref >90)
Glucose, Bld: 137 mg/dL — ABNORMAL HIGH (ref 70–99)
Potassium: 4 mmol/L (ref 3.5–5.1)
Sodium: 137 mmol/L (ref 135–145)

## 2015-01-17 LAB — CBC WITH DIFFERENTIAL/PLATELET
BASOS PCT: 0 % (ref 0–1)
Basophils Absolute: 0 10*3/uL (ref 0.0–0.1)
EOS ABS: 0.1 10*3/uL (ref 0.0–0.7)
EOS PCT: 2 % (ref 0–5)
HCT: 40 % (ref 36.0–46.0)
Hemoglobin: 13.4 g/dL (ref 12.0–15.0)
LYMPHS ABS: 2.6 10*3/uL (ref 0.7–4.0)
Lymphocytes Relative: 33 % (ref 12–46)
MCH: 30.1 pg (ref 26.0–34.0)
MCHC: 33.5 g/dL (ref 30.0–36.0)
MCV: 89.9 fL (ref 78.0–100.0)
MONO ABS: 0.7 10*3/uL (ref 0.1–1.0)
Monocytes Relative: 9 % (ref 3–12)
NEUTROS PCT: 56 % (ref 43–77)
Neutro Abs: 4.5 10*3/uL (ref 1.7–7.7)
Platelets: 166 10*3/uL (ref 150–400)
RBC: 4.45 MIL/uL (ref 3.87–5.11)
RDW: 13.5 % (ref 11.5–15.5)
WBC: 7.9 10*3/uL (ref 4.0–10.5)

## 2015-01-17 LAB — URINE MICROSCOPIC-ADD ON

## 2015-01-17 MED ORDER — HYDROCODONE-ACETAMINOPHEN 5-325 MG PO TABS: 2.0000 | ORAL_TABLET | ORAL | Status: DC | PRN

## 2015-01-17 MED ORDER — SULFAMETHOXAZOLE-TRIMETHOPRIM 800-160 MG PO TABS: 1.0000 | ORAL_TABLET | Freq: Two times a day (BID) | ORAL | Status: DC

## 2015-01-17 MED ORDER — DIPHENHYDRAMINE HCL 50 MG/ML IJ SOLN
25.0000 mg | Freq: Once | INTRAMUSCULAR | Status: AC
  Administered 2015-01-17: 25 mg via INTRAVENOUS
  Filled 2015-01-17: qty 1

## 2015-01-17 MED ORDER — METOCLOPRAMIDE HCL 5 MG/ML IJ SOLN
10.0000 mg | INTRAMUSCULAR | Status: AC
  Administered 2015-01-17: 10 mg via INTRAVENOUS
  Filled 2015-01-17: qty 2

## 2015-01-17 MED ORDER — SODIUM CHLORIDE 0.9 % IV BOLUS (SEPSIS)
1000.0000 mL | Freq: Once | INTRAVENOUS | Status: AC
  Administered 2015-01-17: 1000 mL via INTRAVENOUS

## 2015-01-17 MED ORDER — KETOROLAC TROMETHAMINE 30 MG/ML IJ SOLN
30.0000 mg | Freq: Once | INTRAMUSCULAR | Status: AC
  Administered 2015-01-17: 30 mg via INTRAVENOUS
  Filled 2015-01-17: qty 1

## 2015-01-17 NOTE — ED Notes (Signed)
States having migraine headaches off and on for 2 weeks. States her neck and forehead feel sore. Her eyes hurts. States gets about a 1/2 day relief then headache comes back. Denies nasal congestion or cold symptoms. "my eyes are raw". States has had some diarrhea. Last stool around 4 am. States has been ongoing for about 48 hours. States is loose stool. Denies fever or vomiting. She also complains of " right lower cramping", states has been intermittent for the past 6-7 hours. States she has this every 6 months or so. States they have checked it out and never find anything wrong. Denies any urinary symptoms.

## 2015-01-17 NOTE — ED Notes (Signed)
Pt presents to department for evaluation of headache x2 weeks, also reports RLQ abdominal pain that started yesterday. States headache causes pressure behind both eyes, history of migraines. Pt is alert and oriented x4.
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
## 2015-01-17 NOTE — Discharge Instructions (Signed)
Take Bactrim as directed until gone. Take Vicodin as needed for pain. Refer to attached documents for more information. Follow up with your doctor for further evaluation. Return to the ED with worsening or concerning symptoms.

## 2015-01-19 ENCOUNTER — Emergency Department (HOSPITAL_COMMUNITY): Payer: Medicaid Other

## 2015-01-19 ENCOUNTER — Emergency Department (HOSPITAL_COMMUNITY)
Admission: EM | Admit: 2015-01-19 | Discharge: 2015-01-19 | Disposition: A | Discharge status: Home / Self Care | Payer: Medicaid Other | Attending: Emergency Medicine | Admitting: Emergency Medicine

## 2015-01-19 ENCOUNTER — Encounter (HOSPITAL_COMMUNITY): Payer: Self-pay | Admitting: *Deleted

## 2015-01-19 DIAGNOSIS — K219 Gastro-esophageal reflux disease without esophagitis: Secondary | ICD-10-CM | POA: Diagnosis not present

## 2015-01-19 DIAGNOSIS — G43801 Other migraine, not intractable, with status migrainosus: Secondary | ICD-10-CM | POA: Insufficient documentation

## 2015-01-19 DIAGNOSIS — Z8619 Personal history of other infectious and parasitic diseases: Secondary | ICD-10-CM | POA: Diagnosis not present

## 2015-01-19 DIAGNOSIS — G8929 Other chronic pain: Secondary | ICD-10-CM | POA: Diagnosis not present

## 2015-01-19 DIAGNOSIS — F419 Anxiety disorder, unspecified: Secondary | ICD-10-CM | POA: Diagnosis not present

## 2015-01-19 DIAGNOSIS — M1990 Unspecified osteoarthritis, unspecified site: Secondary | ICD-10-CM | POA: Diagnosis not present

## 2015-01-19 DIAGNOSIS — J45909 Unspecified asthma, uncomplicated: Secondary | ICD-10-CM | POA: Insufficient documentation

## 2015-01-19 DIAGNOSIS — Z79899 Other long term (current) drug therapy: Secondary | ICD-10-CM | POA: Diagnosis not present

## 2015-01-19 DIAGNOSIS — R51 Headache: Secondary | ICD-10-CM

## 2015-01-19 DIAGNOSIS — Z87891 Personal history of nicotine dependence: Secondary | ICD-10-CM | POA: Diagnosis not present

## 2015-01-19 DIAGNOSIS — G43809 Other migraine, not intractable, without status migrainosus: Secondary | ICD-10-CM

## 2015-01-19 DIAGNOSIS — R519 Headache, unspecified: Secondary | ICD-10-CM

## 2015-01-19 DIAGNOSIS — G43909 Migraine, unspecified, not intractable, without status migrainosus: Secondary | ICD-10-CM | POA: Diagnosis present

## 2015-01-19 DIAGNOSIS — Z791 Long term (current) use of non-steroidal anti-inflammatories (NSAID): Secondary | ICD-10-CM | POA: Insufficient documentation

## 2015-01-19 MED ORDER — HYDROMORPHONE HCL 1 MG/ML IJ SOLN
1.0000 mg | Freq: Once | INTRAMUSCULAR | Status: AC
Start: 2015-01-19 — End: 2015-01-19
  Administered 2015-01-19: 1 mg via INTRAVENOUS
  Filled 2015-01-19: qty 1

## 2015-01-19 MED ORDER — HYDROMORPHONE HCL 1 MG/ML IJ SOLN
1.0000 mg | Freq: Once | INTRAMUSCULAR | Status: AC
  Administered 2015-01-19: 1 mg via INTRAVENOUS
  Filled 2015-01-19: qty 1

## 2015-01-19 MED ORDER — METOCLOPRAMIDE HCL 5 MG/ML IJ SOLN
10.0000 mg | Freq: Once | INTRAMUSCULAR | Status: AC
  Administered 2015-01-19: 10 mg via INTRAVENOUS
  Filled 2015-01-19: qty 2

## 2015-01-19 MED ORDER — ONDANSETRON HCL 4 MG/2ML IJ SOLN
4.0000 mg | Freq: Once | INTRAMUSCULAR | Status: AC
  Administered 2015-01-19: 4 mg via INTRAVENOUS
  Filled 2015-01-19: qty 2

## 2015-01-19 MED ORDER — OXYCODONE-ACETAMINOPHEN 5-325 MG PO TABS
1.0000 | ORAL_TABLET | Freq: Once | ORAL | Status: DC
Start: 2015-01-19 — End: 2015-01-19

## 2015-01-19 MED ORDER — KETOROLAC TROMETHAMINE 30 MG/ML IJ SOLN
30.0000 mg | Freq: Once | INTRAMUSCULAR | Status: AC
  Administered 2015-01-19: 30 mg via INTRAVENOUS
  Filled 2015-01-19: qty 1

## 2015-01-19 MED ORDER — ZOLPIDEM TARTRATE 5 MG PO TABS: 5.0000 mg | ORAL_TABLET | Freq: Every evening | ORAL | Status: DC | PRN

## 2015-01-19 NOTE — ED Notes (Signed)
Ice chips given

## 2015-01-19 NOTE — ED Notes (Signed)
Pt transported to CT scan.

## 2015-01-19 NOTE — ED Notes (Signed)
Pt placed on monitor upon return from CT scan. Pt remains monitored by blood pressure and pulse ox.
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
## 2015-01-19 NOTE — ED Notes (Signed)
Pt placed into gown and on monitor upon arrival to room. Pt monitored by blood pressure and pulse ox.  

## 2015-01-19 NOTE — ED Notes (Signed)
Pt. Denies nausea, gingerale and ice chips given.  Pt. Is tolerating well

## 2015-01-19 NOTE — ED Notes (Signed)
Pt in c/o migraine with nausea and abdominal pain since last week, seen for same a few days ago and states symptoms have not resolved

## 2015-01-19 NOTE — Discharge Instructions (Signed)

## 2015-01-22 ENCOUNTER — Encounter (HOSPITAL_COMMUNITY): Payer: Self-pay | Admitting: Emergency Medicine

## 2015-01-22 ENCOUNTER — Emergency Department (HOSPITAL_COMMUNITY)
Admission: EM | Admit: 2015-01-22 | Discharge: 2015-01-22 | Disposition: A | Discharge status: Home / Self Care | Payer: Medicaid Other | Attending: Emergency Medicine | Admitting: Emergency Medicine

## 2015-01-22 DIAGNOSIS — Z87891 Personal history of nicotine dependence: Secondary | ICD-10-CM | POA: Diagnosis not present

## 2015-01-22 DIAGNOSIS — Z791 Long term (current) use of non-steroidal anti-inflammatories (NSAID): Secondary | ICD-10-CM | POA: Diagnosis not present

## 2015-01-22 DIAGNOSIS — M1389 Other specified arthritis, multiple sites: Secondary | ICD-10-CM | POA: Diagnosis not present

## 2015-01-22 DIAGNOSIS — J45909 Unspecified asthma, uncomplicated: Secondary | ICD-10-CM | POA: Insufficient documentation

## 2015-01-22 DIAGNOSIS — K219 Gastro-esophageal reflux disease without esophagitis: Secondary | ICD-10-CM | POA: Diagnosis not present

## 2015-01-22 DIAGNOSIS — Z7952 Long term (current) use of systemic steroids: Secondary | ICD-10-CM | POA: Diagnosis not present

## 2015-01-22 DIAGNOSIS — G43809 Other migraine, not intractable, without status migrainosus: Secondary | ICD-10-CM | POA: Insufficient documentation

## 2015-01-22 DIAGNOSIS — Z8619 Personal history of other infectious and parasitic diseases: Secondary | ICD-10-CM | POA: Insufficient documentation

## 2015-01-22 DIAGNOSIS — G8929 Other chronic pain: Secondary | ICD-10-CM | POA: Insufficient documentation

## 2015-01-22 DIAGNOSIS — F4310 Post-traumatic stress disorder, unspecified: Secondary | ICD-10-CM | POA: Diagnosis not present

## 2015-01-22 DIAGNOSIS — F419 Anxiety disorder, unspecified: Secondary | ICD-10-CM | POA: Diagnosis not present

## 2015-01-22 DIAGNOSIS — Z79899 Other long term (current) drug therapy: Secondary | ICD-10-CM | POA: Diagnosis not present

## 2015-01-22 DIAGNOSIS — G43909 Migraine, unspecified, not intractable, without status migrainosus: Secondary | ICD-10-CM | POA: Diagnosis present

## 2015-01-22 LAB — SEDIMENTATION RATE: Sed Rate: 1 mm/hr (ref 0–22)

## 2015-01-22 MED ORDER — DEXAMETHASONE SODIUM PHOSPHATE 10 MG/ML IJ SOLN
10.0000 mg | Freq: Once | INTRAMUSCULAR | Status: AC
  Administered 2015-01-22: 10 mg via INTRAVENOUS
  Filled 2015-01-22: qty 1

## 2015-01-22 MED ORDER — HYDROMORPHONE HCL 1 MG/ML IJ SOLN
1.0000 mg | Freq: Once | INTRAMUSCULAR | Status: AC
  Administered 2015-01-22: 1 mg via INTRAVENOUS
  Filled 2015-01-22: qty 1

## 2015-01-22 MED ORDER — SODIUM CHLORIDE 0.9 % IV BOLUS (SEPSIS)
500.0000 mL | Freq: Once | INTRAVENOUS | Status: AC
  Administered 2015-01-22: 500 mL via INTRAVENOUS

## 2015-01-22 MED ORDER — METOCLOPRAMIDE HCL 5 MG/ML IJ SOLN
10.0000 mg | Freq: Once | INTRAMUSCULAR | Status: AC
  Administered 2015-01-22: 10 mg via INTRAVENOUS
  Filled 2015-01-22: qty 2

## 2015-01-22 MED ORDER — DIPHENHYDRAMINE HCL 50 MG/ML IJ SOLN
25.0000 mg | Freq: Once | INTRAMUSCULAR | Status: AC
  Administered 2015-01-22: 25 mg via INTRAVENOUS
  Filled 2015-01-22: qty 1

## 2015-01-22 NOTE — ED Notes (Signed)
Discharge instructions reviewed with patient.

## 2015-01-22 NOTE — Discharge Instructions (Signed)
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
## 2015-01-22 NOTE — ED Notes (Signed)
Pt from c/o migraine x 3 weeks. She reports being seen several time at [facility] without relief. Pt difficult to obtain information from  As she states  "I'm just here to get treated". Patient states all treatment provided to her has not helped and she leaves in more pain. She reports taking excedrin at 0500 without relief.

## 2015-01-23 ENCOUNTER — Telehealth: Payer: Self-pay | Admitting: Internal Medicine

## 2015-01-23 NOTE — Telephone Encounter (Signed)
Pt is having headaches for over two weeks and has been going to the ER continuosly. Please follow up with pt as she is currently in pain.

## 2015-01-27 ENCOUNTER — Ambulatory Visit: Payer: Medicaid Other | Admitting: Internal Medicine

## 2015-02-08 ENCOUNTER — Telehealth: Payer: Self-pay | Admitting: Internal Medicine

## 2015-02-08 NOTE — Telephone Encounter (Signed)
Patient called to request an appointment with her PCP for her headaches to be referred to a speacialist, patient stated that she has been going to the ED several times due to her issues and needed to see somebody right away. Patient was offered an appointment with her PCP but refused the appointment stating that she could not wait that long, patient has had no show appointments but stated that she could not make it. Please f/u with patient.

## 2015-03-05 ENCOUNTER — Encounter (HOSPITAL_COMMUNITY): Payer: Self-pay | Admitting: Emergency Medicine

## 2015-03-05 ENCOUNTER — Emergency Department (HOSPITAL_COMMUNITY)
Admission: EM | Admit: 2015-03-05 | Discharge: 2015-03-06 | Disposition: A | Discharge status: Home / Self Care | Payer: Medicaid Other | Attending: Emergency Medicine | Admitting: Emergency Medicine

## 2015-03-05 DIAGNOSIS — G43909 Migraine, unspecified, not intractable, without status migrainosus: Secondary | ICD-10-CM | POA: Diagnosis present

## 2015-03-05 DIAGNOSIS — Z8619 Personal history of other infectious and parasitic diseases: Secondary | ICD-10-CM | POA: Insufficient documentation

## 2015-03-05 DIAGNOSIS — R519 Headache, unspecified: Secondary | ICD-10-CM

## 2015-03-05 DIAGNOSIS — G8929 Other chronic pain: Secondary | ICD-10-CM | POA: Insufficient documentation

## 2015-03-05 DIAGNOSIS — J45909 Unspecified asthma, uncomplicated: Secondary | ICD-10-CM | POA: Insufficient documentation

## 2015-03-05 DIAGNOSIS — Z79899 Other long term (current) drug therapy: Secondary | ICD-10-CM | POA: Insufficient documentation

## 2015-03-05 DIAGNOSIS — K219 Gastro-esophageal reflux disease without esophagitis: Secondary | ICD-10-CM | POA: Diagnosis not present

## 2015-03-05 DIAGNOSIS — M1990 Unspecified osteoarthritis, unspecified site: Secondary | ICD-10-CM | POA: Diagnosis not present

## 2015-03-05 DIAGNOSIS — F419 Anxiety disorder, unspecified: Secondary | ICD-10-CM | POA: Insufficient documentation

## 2015-03-05 DIAGNOSIS — Z87891 Personal history of nicotine dependence: Secondary | ICD-10-CM | POA: Diagnosis not present

## 2015-03-05 DIAGNOSIS — R51 Headache: Secondary | ICD-10-CM

## 2015-03-05 MED ORDER — METOCLOPRAMIDE HCL 5 MG/ML IJ SOLN
10.0000 mg | Freq: Once | INTRAMUSCULAR | Status: AC
  Administered 2015-03-05: 10 mg via INTRAVENOUS
  Filled 2015-03-05: qty 2

## 2015-03-05 MED ORDER — HYDROMORPHONE HCL 1 MG/ML IJ SOLN
0.5000 mg | Freq: Once | INTRAMUSCULAR | Status: AC
  Administered 2015-03-05: 0.5 mg via INTRAVENOUS
  Filled 2015-03-05: qty 1

## 2015-03-05 MED ORDER — SODIUM CHLORIDE 0.9 % IV BOLUS (SEPSIS)
1000.0000 mL | Freq: Once | INTRAVENOUS | Status: AC
  Administered 2015-03-05: 1000 mL via INTRAVENOUS

## 2015-03-05 MED ORDER — HYDROMORPHONE HCL 1 MG/ML IJ SOLN
1.0000 mg | Freq: Once | INTRAMUSCULAR | Status: AC
  Administered 2015-03-06: 1 mg via INTRAVENOUS
  Filled 2015-03-05: qty 1

## 2015-03-05 MED ORDER — KETOROLAC TROMETHAMINE 30 MG/ML IJ SOLN
30.0000 mg | Freq: Once | INTRAMUSCULAR | Status: AC
  Administered 2015-03-06: 30 mg via INTRAVENOUS
  Filled 2015-03-05: qty 1
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
## 2015-03-05 NOTE — ED Notes (Signed)
Pt from home c/o migraine since Friday. She reports she is out of her migraine medication. Nausea present.

## 2015-03-05 NOTE — ED Notes (Signed)
Pt reported that she was positive that she could not be pregnant.

## 2015-03-06 NOTE — ED Notes (Signed)
Awake. Verbally responsive. A/O x4. Resp even and unlabored. No audible adventitious breath sounds noted. ABC's intact.  

## 2015-03-06 NOTE — Discharge Instructions (Signed)
Please call your doctor for a followup appointment within 24-48 hours. When you talk to your doctor please let them know that you were seen in the emergency department and have them acquire all of your records so that they can discuss the findings with you and formulate a treatment plan to fully care for your new and ongoing problems. Please follow-up with her primary care provider Please keep appointment with neurologist on 03/09/2015 Please keep appointment for outpatient imaging Please rest and stay hydrated Please avoid food that is high in caffeine, salt and sugars Please continue to monitor symptoms closely and if symptoms are to worsen or change (fever greater than 101, chills, sweating, nausea, vomiting, chest pain, shortness of breathe, difficulty breathing, weakness, numbness, tingling, worsening or changes to pain pattern, fall, head injury, blurred vision, complete loss of vision, inability swallow, loss sensation, weakness to one side of the body) please report back to the Emergency Department immediately.   General Headache Without Cause A headache is pain or discomfort felt around the head or neck area. The specific cause of a headache may not be found. There are many causes and types of headaches. A few common ones are:  Tension headaches.  Migraine headaches.  Cluster headaches.  Chronic daily headaches. HOME CARE INSTRUCTIONS   Keep all follow-up appointments with your caregiver or any specialist referral.  Only take over-the-counter or prescription medicines for pain or discomfort as directed by your caregiver.  Lie down in a dark, quiet room when you have a headache.  Keep a headache journal to find out what may trigger your migraine headaches. For example, write down:  What you eat and drink.  How much sleep you get.  Any change to your diet or medicines.  Try massage or other relaxation techniques.  Put ice packs or heat on the head and neck. Use these 3 to 4  times per day for 15 to 20 minutes each time, or as needed.  Limit stress.  Sit up straight, and do not tense your muscles.  Quit smoking if you smoke.  Limit alcohol use.  Decrease the amount of caffeine you drink, or stop drinking caffeine.  Eat and sleep on a regular schedule.  Get 7 to 9 hours of sleep, or as recommended by your caregiver.  Keep lights dim if bright lights bother you and make your headaches worse. SEEK MEDICAL CARE IF:   You have problems with the medicines you were prescribed.  Your medicines are not working.  You have a change from the usual headache.  You have nausea or vomiting. SEEK IMMEDIATE MEDICAL CARE IF:   Your headache becomes severe.  You have a fever.  You have a stiff neck.  You have loss of vision.  You have muscular weakness or loss of muscle control.  You start losing your balance or have trouble walking.  You feel faint or pass out.  You have severe symptoms that are different from your first symptoms. MAKE SURE YOU:   Understand these instructions.  Will watch your condition.  Will get help right away if you are not doing well or get worse. Document Released: 12/16/2005 Document Revised: 03/09/2012 Document Reviewed: 01/01/2012 [facility] Patient Information 2015 [place], [place]. This information is not intended to replace advice given to you by your health care provider. Make sure you discuss any questions you have with your health care provider.

## 2015-03-27 DIAGNOSIS — L723 Sebaceous cyst: Secondary | ICD-10-CM | POA: Insufficient documentation

## 2015-03-28 DIAGNOSIS — G43909 Migraine, unspecified, not intractable, without status migrainosus: Secondary | ICD-10-CM | POA: Insufficient documentation

## 2015-03-28 DIAGNOSIS — IMO0002 Reserved for concepts with insufficient information to code with codable children: Secondary | ICD-10-CM | POA: Insufficient documentation

## 2015-04-04 DIAGNOSIS — G4440 Drug-induced headache, not elsewhere classified, not intractable: Secondary | ICD-10-CM | POA: Insufficient documentation

## 2015-07-17 IMAGING — CR DG CHEST 2V
2 series · 2 of 2 positions shown · non-contrast
Comparison: 07/21/2014, 11/18/2013, 10/30/2013.

CLINICAL DATA: Chest pain

EXAM:
CHEST  2 VIEW

[w chest pa]
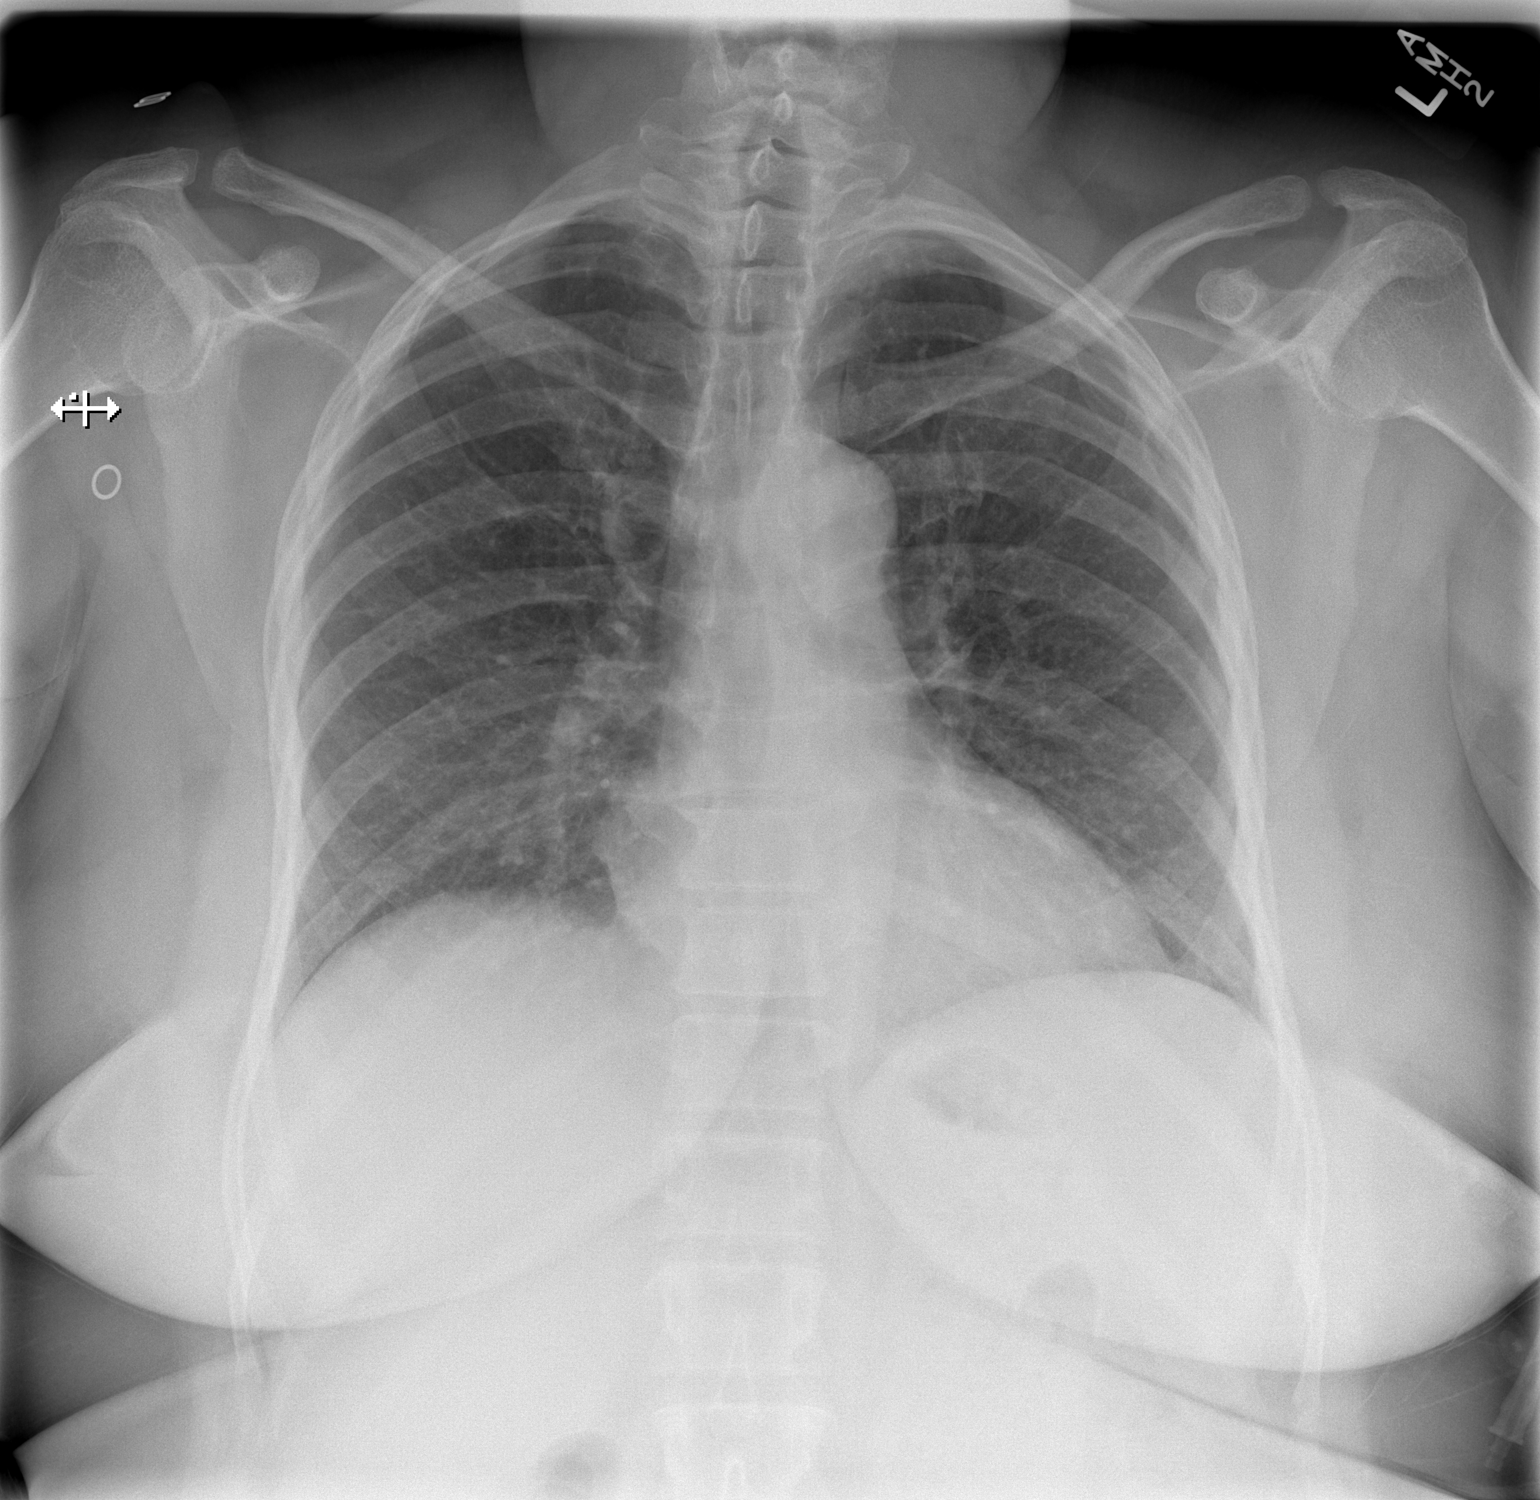

[w chest lat]
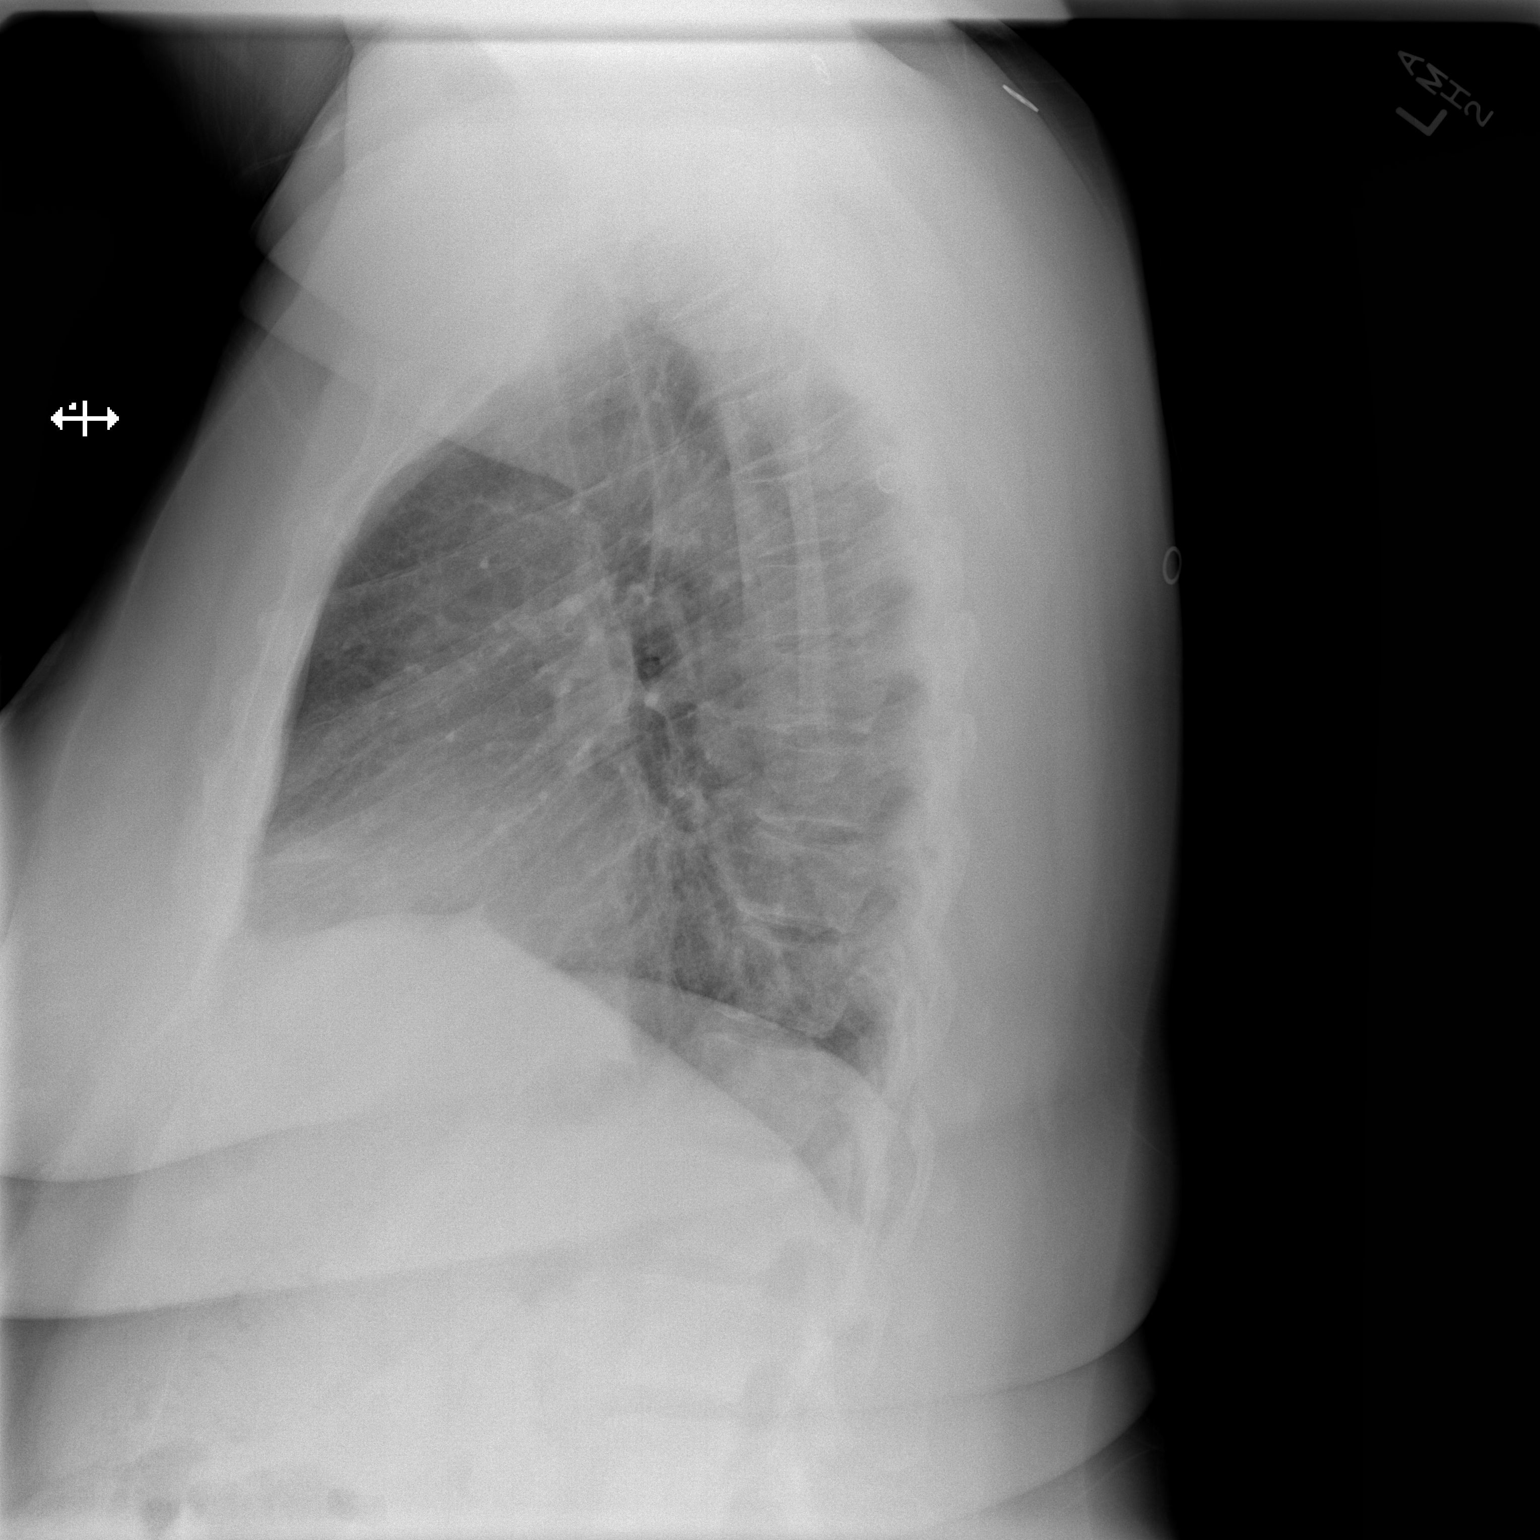

[2 of 2 positions shown; findings below may reference images not displayed]

FINDINGS: Heart size is normal. Mediastinal shadows are normal. The patient
has not taken a deep inspiration. I think there are slightly more
prominent interstitial markings in the lower lungs that could go
along with interstitial pneumonia. No consolidation or collapse. No
effusions.
IMPRESSION: More prominent interstitial markings in the lower lungs than have
been seen on previous films. This raises the possibility of
interstitial pneumonia.

## 2015-11-06 ENCOUNTER — Encounter (HOSPITAL_COMMUNITY): Payer: Self-pay | Admitting: *Deleted

## 2015-11-06 ENCOUNTER — Emergency Department (HOSPITAL_COMMUNITY): Payer: Medicaid Other

## 2015-11-06 ENCOUNTER — Emergency Department (HOSPITAL_COMMUNITY)
Admission: EM | Admit: 2015-11-06 | Discharge: 2015-11-06 | Disposition: A | Discharge status: Home / Self Care | Payer: Medicaid Other | Attending: Emergency Medicine | Admitting: Emergency Medicine

## 2015-11-06 DIAGNOSIS — Z87891 Personal history of nicotine dependence: Secondary | ICD-10-CM | POA: Diagnosis not present

## 2015-11-06 DIAGNOSIS — S3992XA Unspecified injury of lower back, initial encounter: Secondary | ICD-10-CM | POA: Insufficient documentation

## 2015-11-06 DIAGNOSIS — Y998 Other external cause status: Secondary | ICD-10-CM | POA: Insufficient documentation

## 2015-11-06 DIAGNOSIS — M7918 Myalgia, other site: Secondary | ICD-10-CM

## 2015-11-06 DIAGNOSIS — Y92000 Kitchen of unspecified non-institutional (private) residence as  the place of occurrence of the external cause: Secondary | ICD-10-CM | POA: Diagnosis not present

## 2015-11-06 DIAGNOSIS — W01198A Fall on same level from slipping, tripping and stumbling with subsequent striking against other object, initial encounter: Secondary | ICD-10-CM | POA: Insufficient documentation

## 2015-11-06 DIAGNOSIS — Y9389 Activity, other specified: Secondary | ICD-10-CM | POA: Insufficient documentation

## 2015-11-06 DIAGNOSIS — S4992XA Unspecified injury of left shoulder and upper arm, initial encounter: Secondary | ICD-10-CM | POA: Insufficient documentation

## 2015-11-06 DIAGNOSIS — S59902A Unspecified injury of left elbow, initial encounter: Secondary | ICD-10-CM | POA: Diagnosis not present

## 2015-11-06 MED ORDER — METHOCARBAMOL 500 MG PO TABS
500.0000 mg | ORAL_TABLET | Freq: Once | ORAL | Status: AC
  Administered 2015-11-06: 500 mg via ORAL
  Filled 2015-11-06: qty 1

## 2015-11-06 MED ORDER — IBUPROFEN 400 MG PO TABS
800.0000 mg | ORAL_TABLET | Freq: Once | ORAL | Status: AC
  Administered 2015-11-06: 800 mg via ORAL
  Filled 2015-11-06: qty 2

## 2015-11-06 MED ORDER — METHOCARBAMOL 500 MG PO TABS: 500.0000 mg | ORAL_TABLET | Freq: Two times a day (BID) | ORAL | Status: DC

## 2015-11-06 MED ORDER — METHOCARBAMOL 500 MG PO TABS: 500.0000 mg | ORAL_TABLET | Freq: Two times a day (BID) | ORAL | Status: DC | PRN
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
## 2015-11-06 NOTE — ED Notes (Signed)
Pt states lower back pain since this 9am and then it just gave out and she hit the floor. No syncope.  No head injury. Pt has pain to left elbow and continues to have pain in lower back.

## 2015-11-06 NOTE — Discharge Instructions (Signed)
For pain control you may take up to 800mg  of Motrin (also known as ibuprofen). That is usually 4 over the counter pills,  3 times a day. Take with food to minimize stomach irritation   You can also take  tylenol (acetaminophen) 975mg  (this is 3 over the counter pills) four times a day. Do not drink alcohol or combine with other medications that have acetaminophen as an ingredient (Read the labels!).    For breakthrough pain you may take Robaxin. Do not drink alcohol, drive or operate heavy machinery when taking Robaxin.  Please follow with your primary care physician at [facility] in the next 24-48 hours.  Do not hesitate to return to the emergency room for any new, worsening or concerning symptoms.   Musculoskeletal Pain Musculoskeletal pain is muscle and boney aches and pains. These pains can occur in any part of the body. Your caregiver may treat you without knowing the cause of the pain. They may treat you if blood or urine tests, X-rays, and other tests were normal.  CAUSES There is often not a definite cause or reason for these pains. These pains may be caused by a type of germ (virus). The discomfort may also come from overuse. Overuse includes working out too hard when your body is not fit. Boney aches also come from weather changes. Bone is sensitive to atmospheric pressure changes. HOME CARE INSTRUCTIONS   Ask when your test results will be ready. Make sure you get your test results.  Only take over-the-counter or prescription medicines for pain, discomfort, or fever as directed by your caregiver. If you were given medications for your condition, do not drive, operate machinery or power tools, or sign legal documents for 24 hours. Do not drink alcohol. Do not take sleeping pills or other medications that may interfere with treatment.  Continue all activities unless the activities cause more pain. When the pain lessens, slowly resume normal activities. Gradually  increase the intensity and duration of the activities or exercise.  During periods of severe pain, bed rest may be helpful. Lay or sit in any position that is comfortable.  Putting ice on the injured area.  Put ice in a bag.  Place a towel between your skin and the bag.  Leave the ice on for 15 to 20 minutes, 3 to 4 times a day.  Follow up with your caregiver for continued problems and no reason can be found for the pain. If the pain becomes worse or does not go away, it may be necessary to repeat tests or do additional testing. Your caregiver may need to look further for a possible cause. SEEK IMMEDIATE MEDICAL CARE IF:  You have pain that is getting worse and is not relieved by medications.  You develop chest pain that is associated with shortness or breath, sweating, feeling sick to your stomach (nauseous), or throw up (vomit).  Your pain becomes localized to the abdomen.  You develop any new symptoms that seem different or that concern you. MAKE SURE YOU:   Understand these instructions.  Will watch your condition.  Will get help right away if you are not doing well or get worse.   This information is not intended to replace advice given to you by your health care provider. Make sure you discuss any questions you have with your health care provider.   Document Released: 12/16/2005 Document Revised: 03/09/2012 Document Reviewed: 08/20/2013 Elsevier Interactive Patient Education [employer].

## 2015-11-07 ENCOUNTER — Encounter (HOSPITAL_COMMUNITY): Payer: Self-pay | Admitting: Emergency Medicine

## 2016-11-19 DIAGNOSIS — F5101 Primary insomnia: Secondary | ICD-10-CM | POA: Insufficient documentation

## 2016-11-19 DIAGNOSIS — F411 Generalized anxiety disorder: Secondary | ICD-10-CM | POA: Insufficient documentation

## 2016-12-16 DIAGNOSIS — R197 Diarrhea, unspecified: Secondary | ICD-10-CM | POA: Insufficient documentation

## 2017-09-02 DIAGNOSIS — M7711 Lateral epicondylitis, right elbow: Secondary | ICD-10-CM | POA: Insufficient documentation

## 2017-12-10 DIAGNOSIS — R310 Gross hematuria: Secondary | ICD-10-CM | POA: Insufficient documentation

## 2019-12-16 DIAGNOSIS — M1830 Unilateral post-traumatic osteoarthritis of first carpometacarpal joint, unspecified hand: Secondary | ICD-10-CM | POA: Insufficient documentation

## 2020-04-04 ENCOUNTER — Emergency Department (HOSPITAL_COMMUNITY)
Admission: EM | Admit: 2020-04-04 | Discharge: 2020-04-04 | Disposition: A | Discharge status: Home / Self Care | Payer: Medicaid Other | Attending: Emergency Medicine | Admitting: Emergency Medicine

## 2020-04-04 ENCOUNTER — Other Ambulatory Visit: Payer: Self-pay

## 2020-04-04 ENCOUNTER — Emergency Department (HOSPITAL_BASED_OUTPATIENT_CLINIC_OR_DEPARTMENT_OTHER): Payer: Medicaid Other

## 2020-04-04 DIAGNOSIS — M79604 Pain in right leg: Secondary | ICD-10-CM | POA: Diagnosis not present

## 2020-04-04 DIAGNOSIS — J45909 Unspecified asthma, uncomplicated: Secondary | ICD-10-CM | POA: Diagnosis not present

## 2020-04-04 DIAGNOSIS — Z87891 Personal history of nicotine dependence: Secondary | ICD-10-CM | POA: Insufficient documentation

## 2020-04-04 DIAGNOSIS — R2243 Localized swelling, mass and lump, lower limb, bilateral: Secondary | ICD-10-CM | POA: Diagnosis not present

## 2020-04-04 DIAGNOSIS — M79609 Pain in unspecified limb: Secondary | ICD-10-CM

## 2020-04-04 DIAGNOSIS — M7989 Other specified soft tissue disorders: Secondary | ICD-10-CM | POA: Diagnosis not present

## 2020-04-04 DIAGNOSIS — Z79899 Other long term (current) drug therapy: Secondary | ICD-10-CM | POA: Insufficient documentation

## 2020-04-04 DIAGNOSIS — M79605 Pain in left leg: Secondary | ICD-10-CM | POA: Insufficient documentation

## 2020-04-04 LAB — BASIC METABOLIC PANEL
Anion gap: 5 (ref 5–15)
BUN: 15 mg/dL (ref 6–20)
CO2: 24 mmol/L (ref 22–32)
Calcium: 8.6 mg/dL — ABNORMAL LOW (ref 8.9–10.3)
Chloride: 108 mmol/L (ref 98–111)
Creatinine, Ser: 0.93 mg/dL (ref 0.44–1.00)
GFR calc Af Amer: >60 mL/min (ref >60)
GFR calc non Af Amer: >60 mL/min (ref >60)
Glucose, Bld: 104 mg/dL — ABNORMAL HIGH (ref 70–99)
Potassium: 3.9 mmol/L (ref 3.5–5.1)
Sodium: 137 mmol/L (ref 135–145)

## 2020-04-04 MED ORDER — METAXALONE 800 MG PO TABS: 800.0000 mg | ORAL_TABLET | Freq: Three times a day (TID) | ORAL | 0 refills | Status: DC

## 2020-04-04 MED ORDER — FUROSEMIDE 20 MG PO TABS: 20.0000 mg | ORAL_TABLET | Freq: Every day | ORAL | 0 refills | Status: DC

## 2020-04-04 MED ORDER — POTASSIUM CHLORIDE ER 10 MEQ PO TBCR: 10.0000 meq | EXTENDED_RELEASE_TABLET | Freq: Once | ORAL | 0 refills | Status: DC

## 2020-04-04 NOTE — Progress Notes (Signed)
Lower venous duplex       has been completed. Preliminary results can be found under CV proc through chart review. Eden Rho, BS, RDMS, RVT   

## 2020-04-04 NOTE — ED Triage Notes (Signed)
Per patient, both feet and legs were swelling and tender, she went to UC and they said she had increased d-dimer.
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
## 2020-05-07 ENCOUNTER — Other Ambulatory Visit: Payer: Self-pay

## 2020-05-07 ENCOUNTER — Encounter (HOSPITAL_COMMUNITY): Payer: Self-pay

## 2020-05-07 ENCOUNTER — Emergency Department (HOSPITAL_COMMUNITY)
Admission: EM | Admit: 2020-05-07 | Discharge: 2020-05-07 | Disposition: A | Discharge status: Home / Self Care | Payer: Medicaid Other | Attending: Emergency Medicine | Admitting: Emergency Medicine

## 2020-05-07 DIAGNOSIS — M25562 Pain in left knee: Secondary | ICD-10-CM | POA: Diagnosis not present

## 2020-05-07 DIAGNOSIS — M25561 Pain in right knee: Secondary | ICD-10-CM | POA: Insufficient documentation

## 2020-05-07 DIAGNOSIS — E039 Hypothyroidism, unspecified: Secondary | ICD-10-CM | POA: Insufficient documentation

## 2020-05-07 DIAGNOSIS — Z87828 Personal history of other (healed) physical injury and trauma: Secondary | ICD-10-CM | POA: Diagnosis not present

## 2020-05-07 DIAGNOSIS — M25572 Pain in left ankle and joints of left foot: Secondary | ICD-10-CM | POA: Insufficient documentation

## 2020-05-07 DIAGNOSIS — M25571 Pain in right ankle and joints of right foot: Secondary | ICD-10-CM | POA: Insufficient documentation

## 2020-05-07 DIAGNOSIS — Z79899 Other long term (current) drug therapy: Secondary | ICD-10-CM | POA: Insufficient documentation

## 2020-05-07 LAB — CBC WITH DIFFERENTIAL/PLATELET
Abs Immature Granulocytes: 0.02 10*3/uL (ref 0.00–0.07)
Basophils Absolute: 0 10*3/uL (ref 0.0–0.1)
Basophils Relative: 0 %
Eosinophils Absolute: 0.1 10*3/uL (ref 0.0–0.5)
Eosinophils Relative: 1 %
HCT: 42 % (ref 36.0–46.0)
Hemoglobin: 13.3 g/dL (ref 12.0–15.0)
Immature Granulocytes: 0 %
Lymphocytes Relative: 29 %
Lymphs Abs: 2.4 10*3/uL (ref 0.7–4.0)
MCH: 29.8 pg (ref 26.0–34.0)
MCHC: 31.7 g/dL (ref 30.0–36.0)
MCV: 94 fL (ref 80.0–100.0)
Monocytes Absolute: 0.9 10*3/uL (ref 0.1–1.0)
Monocytes Relative: 11 %
Neutro Abs: 5.1 10*3/uL (ref 1.7–7.7)
Neutrophils Relative %: 59 %
Platelets: 237 10*3/uL (ref 150–400)
RBC: 4.47 MIL/uL (ref 3.87–5.11)
RDW: 12.7 % (ref 11.5–15.5)
WBC: 8.5 10*3/uL (ref 4.0–10.5)
nRBC: 0 % (ref 0.0–0.2)

## 2020-05-07 LAB — BASIC METABOLIC PANEL
Anion gap: 7 (ref 5–15)
BUN: 14 mg/dL (ref 6–20)
CO2: 24 mmol/L (ref 22–32)
Calcium: 8.5 mg/dL — ABNORMAL LOW (ref 8.9–10.3)
Chloride: 106 mmol/L (ref 98–111)
Creatinine, Ser: 1.07 mg/dL — ABNORMAL HIGH (ref 0.44–1.00)
GFR calc Af Amer: >60 mL/min (ref >60)
GFR calc non Af Amer: >60 mL/min (ref >60)
Glucose, Bld: 91 mg/dL (ref 70–99)
Potassium: 3.9 mmol/L (ref 3.5–5.1)
Sodium: 137 mmol/L (ref 135–145)

## 2020-05-07 LAB — MAGNESIUM: Magnesium: 2 mg/dL (ref 1.7–2.4)

## 2020-05-07 MED ORDER — CYCLOBENZAPRINE HCL 10 MG PO TABS: 10.0000 mg | ORAL_TABLET | Freq: Two times a day (BID) | ORAL | 0 refills | Status: DC | PRN

## 2020-05-07 NOTE — ED Notes (Signed)
Pt verbalizes understanding of DC instructions. Pt belongings returned and is ambulatory out of ED.  

## 2020-05-07 NOTE — Discharge Instructions (Addendum)
Per our discussion, I am prescribing you a new medication called Flexeril.  This is a muscle relaxant.  It has a sedating effect.  You can take this up to twice a day.  Please do not operate a motor vehicle after taking this.  Please do not mix with alcohol.  I would recommend you continue to take Tylenol for your chronic pain.  I would also recommend that you rest, ice, compress, elevate your lower extremities.  I would highly recommend following up with your primary care provider regarding this visit as well as your symptoms.  You noted that they were going to refer you to orthopedics, which I also recommended.  They might also want to consider sending you to rheumatology.  If your symptoms worsen, please return to the emergency department.  It was a pleasure to meet you.

## 2020-05-07 NOTE — ED Triage Notes (Signed)
Pt arrived ambulatory from home into ED CC Bilateral knee and ankle pain/swelling. Pt denies recent injury and reports previous visit for same complaint without resolving. Pt reports "taking a off brand muscle relaxer at 0700 today and usually takes tylenol but nothing helps".   VSS Afebrile
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
## 2020-05-27 ENCOUNTER — Emergency Department (HOSPITAL_COMMUNITY)
Admission: EM | Admit: 2020-05-27 | Discharge: 2020-05-27 | Disposition: A | Discharge status: Home / Self Care | Payer: Medicaid Other | Attending: Emergency Medicine | Admitting: Emergency Medicine

## 2020-05-27 ENCOUNTER — Emergency Department (HOSPITAL_COMMUNITY): Payer: Medicaid Other

## 2020-05-27 ENCOUNTER — Other Ambulatory Visit: Payer: Self-pay

## 2020-05-27 DIAGNOSIS — W228XXA Striking against or struck by other objects, initial encounter: Secondary | ICD-10-CM | POA: Diagnosis not present

## 2020-05-27 DIAGNOSIS — Y9389 Activity, other specified: Secondary | ICD-10-CM | POA: Insufficient documentation

## 2020-05-27 DIAGNOSIS — Y998 Other external cause status: Secondary | ICD-10-CM | POA: Diagnosis not present

## 2020-05-27 DIAGNOSIS — H53149 Visual discomfort, unspecified: Secondary | ICD-10-CM | POA: Diagnosis not present

## 2020-05-27 DIAGNOSIS — J45909 Unspecified asthma, uncomplicated: Secondary | ICD-10-CM | POA: Insufficient documentation

## 2020-05-27 DIAGNOSIS — Z87891 Personal history of nicotine dependence: Secondary | ICD-10-CM | POA: Diagnosis not present

## 2020-05-27 DIAGNOSIS — Y929 Unspecified place or not applicable: Secondary | ICD-10-CM | POA: Insufficient documentation

## 2020-05-27 DIAGNOSIS — S060X0A Concussion without loss of consciousness, initial encounter: Secondary | ICD-10-CM | POA: Diagnosis not present

## 2020-05-27 DIAGNOSIS — S0990XA Unspecified injury of head, initial encounter: Secondary | ICD-10-CM | POA: Diagnosis present

## 2020-05-27 DIAGNOSIS — L292 Pruritus vulvae: Secondary | ICD-10-CM | POA: Diagnosis not present

## 2020-05-27 LAB — WET PREP, GENITAL
Sperm: NONE SEEN
Trich, Wet Prep: NONE SEEN
Yeast Wet Prep HPF POC: NONE SEEN

## 2020-05-27 LAB — CBC WITH DIFFERENTIAL/PLATELET
Abs Immature Granulocytes: 0.02 10*3/uL (ref 0.00–0.07)
Basophils Absolute: 0 10*3/uL (ref 0.0–0.1)
Basophils Relative: 0 %
Eosinophils Absolute: 0.1 10*3/uL (ref 0.0–0.5)
Eosinophils Relative: 2 %
HCT: 43.5 % (ref 36.0–46.0)
Hemoglobin: 14.3 g/dL (ref 12.0–15.0)
Immature Granulocytes: 0 %
Lymphocytes Relative: 34 %
Lymphs Abs: 2.5 10*3/uL (ref 0.7–4.0)
MCH: 30.2 pg (ref 26.0–34.0)
MCHC: 32.9 g/dL (ref 30.0–36.0)
MCV: 92 fL (ref 80.0–100.0)
Monocytes Absolute: 0.8 10*3/uL (ref 0.1–1.0)
Monocytes Relative: 10 %
Neutro Abs: 3.9 10*3/uL (ref 1.7–7.7)
Neutrophils Relative %: 54 %
Platelets: 216 10*3/uL (ref 150–400)
RBC: 4.73 MIL/uL (ref 3.87–5.11)
RDW: 12.9 % (ref 11.5–15.5)
WBC: 7.4 10*3/uL (ref 4.0–10.5)
nRBC: 0 % (ref 0.0–0.2)

## 2020-05-27 LAB — I-STAT BETA HCG BLOOD, ED (MC, WL, AP ONLY): I-stat hCG, quantitative: <5 m[IU]/mL (ref <5)

## 2020-05-27 LAB — URINALYSIS, ROUTINE W REFLEX MICROSCOPIC
Bacteria, UA: NONE SEEN
Bilirubin Urine: NEGATIVE
Glucose, UA: NEGATIVE mg/dL
Hgb urine dipstick: NEGATIVE
Ketones, ur: NEGATIVE mg/dL
Leukocytes,Ua: NEGATIVE
Nitrite: NEGATIVE
Protein, ur: 30 mg/dL — AB
Specific Gravity, Urine: 1.025 (ref 1.005–1.030)
pH: 5 (ref 5.0–8.0)

## 2020-05-27 LAB — COMPREHENSIVE METABOLIC PANEL
ALT: 24 U/L (ref 0–44)
AST: 32 U/L (ref 15–41)
Albumin: 4.2 g/dL (ref 3.5–5.0)
Alkaline Phosphatase: 63 U/L (ref 38–126)
Anion gap: 7 (ref 5–15)
BUN: 10 mg/dL (ref 6–20)
CO2: 24 mmol/L (ref 22–32)
Calcium: 8.7 mg/dL — ABNORMAL LOW (ref 8.9–10.3)
Chloride: 103 mmol/L (ref 98–111)
Creatinine, Ser: 0.87 mg/dL (ref 0.44–1.00)
GFR calc Af Amer: >60 mL/min (ref >60)
GFR calc non Af Amer: >60 mL/min (ref >60)
Glucose, Bld: 118 mg/dL — ABNORMAL HIGH (ref 70–99)
Potassium: 4.1 mmol/L (ref 3.5–5.1)
Sodium: 134 mmol/L — ABNORMAL LOW (ref 135–145)
Total Bilirubin: 0.8 mg/dL (ref 0.3–1.2)
Total Protein: 7.3 g/dL (ref 6.5–8.1)

## 2020-05-27 MED ORDER — KETOROLAC TROMETHAMINE 30 MG/ML IJ SOLN
30.0000 mg | Freq: Once | INTRAMUSCULAR | Status: AC
  Administered 2020-05-27: 30 mg via INTRAVENOUS
  Filled 2020-05-27: qty 1

## 2020-05-27 MED ORDER — DIPHENHYDRAMINE HCL 25 MG PO CAPS
25.0000 mg | ORAL_CAPSULE | Freq: Once | ORAL | Status: AC
  Administered 2020-05-27: 25 mg via ORAL
  Filled 2020-05-27: qty 1

## 2020-05-27 MED ORDER — METOCLOPRAMIDE HCL 5 MG/ML IJ SOLN
10.0000 mg | Freq: Once | INTRAMUSCULAR | Status: DC
  Filled 2020-05-27: qty 2

## 2020-05-27 NOTE — ED Notes (Signed)
Urine culture sent down to lab  

## 2020-05-27 NOTE — ED Notes (Signed)
Patient collected her own wet prep in the restroom
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
## 2020-05-27 NOTE — ED Notes (Signed)
Pt d/c'd to home with written and verbal instructions. Voiced understanding. A/Ox3. Skin w/d/pink. Resp wnl. Equal and non-labored. IV d/c'd and intact. Bleeding controlled. Pt remains somewhat hypertensive. Provider made aware. Provider at bedside to give verbal instructions to pt regarding follow up care. Declined w/c. Gait steady. Ambulated out of ED independently and safely with belongings. NAD. No complaints voiced.

## 2020-05-27 NOTE — Discharge Instructions (Addendum)
You were seen today for head injury, your symptoms are most likely due to concussion syndrome.  Use the guide attached is a reference for this.  Come back to the emergency room if you have any worsening symptoms, vision changes, weakness, incontinence, difficulty walking, vision changes, worsening headache.  I want you to take ibuprofen as prescribed on the bottle for pain.  Follow-up with your primary care as we spoke about for the symptoms.  Your wet prep is pending as we spoke about you want to leave before this was resulted.  Look on my chart for the results.  Follow-up will be as we spoke about symptoms continue for a pelvic exam.
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
## 2020-07-31 ENCOUNTER — Emergency Department (HOSPITAL_COMMUNITY)
Admission: EM | Admit: 2020-07-31 | Discharge: 2020-07-31 | Disposition: A | Discharge status: Home / Self Care | Payer: Medicaid Other | Attending: Emergency Medicine | Admitting: Emergency Medicine

## 2020-07-31 ENCOUNTER — Other Ambulatory Visit: Payer: Self-pay

## 2020-07-31 ENCOUNTER — Encounter (HOSPITAL_COMMUNITY): Payer: Self-pay | Admitting: Emergency Medicine

## 2020-07-31 DIAGNOSIS — Z7951 Long term (current) use of inhaled steroids: Secondary | ICD-10-CM | POA: Diagnosis not present

## 2020-07-31 DIAGNOSIS — Z79899 Other long term (current) drug therapy: Secondary | ICD-10-CM | POA: Insufficient documentation

## 2020-07-31 DIAGNOSIS — M545 Low back pain, unspecified: Secondary | ICD-10-CM

## 2020-07-31 DIAGNOSIS — J45909 Unspecified asthma, uncomplicated: Secondary | ICD-10-CM | POA: Diagnosis not present

## 2020-07-31 DIAGNOSIS — Z87891 Personal history of nicotine dependence: Secondary | ICD-10-CM | POA: Insufficient documentation

## 2020-07-31 MED ORDER — OXYCODONE-ACETAMINOPHEN 5-325 MG PO TABS
2.0000 | ORAL_TABLET | Freq: Once | ORAL | Status: AC
  Administered 2020-07-31: 2 via ORAL
  Filled 2020-07-31: qty 2

## 2020-07-31 MED ORDER — KETOROLAC TROMETHAMINE 60 MG/2ML IM SOLN
30.0000 mg | Freq: Once | INTRAMUSCULAR | Status: AC
  Administered 2020-07-31: 30 mg via INTRAMUSCULAR
  Filled 2020-07-31: qty 2

## 2020-07-31 MED ORDER — CYCLOBENZAPRINE HCL 10 MG PO TABS
10.0000 mg | ORAL_TABLET | Freq: Three times a day (TID) | ORAL | 0 refills | Status: DC | PRN
Start: 2020-07-31 — End: 2020-07-31

## 2020-07-31 MED ORDER — MELOXICAM 7.5 MG PO TABS: 7.5000 mg | ORAL_TABLET | Freq: Every day | ORAL | 0 refills | Status: DC

## 2020-07-31 MED ORDER — CYCLOBENZAPRINE HCL 10 MG PO TABS
10.0000 mg | ORAL_TABLET | Freq: Three times a day (TID) | ORAL | 0 refills | Status: DC | PRN
Start: 2020-07-31 — End: 2021-03-08

## 2020-07-31 NOTE — ED Triage Notes (Addendum)
Pt in w/low back pain since yesterday. States she was working, lifting boxes and pain began when she went home that night. Denies any bladder issues or leg numbness. Took 2 Aleve w/no relief
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
## 2020-11-07 ENCOUNTER — Encounter (HOSPITAL_COMMUNITY): Payer: Self-pay | Admitting: Emergency Medicine

## 2020-11-07 ENCOUNTER — Emergency Department (HOSPITAL_COMMUNITY)
Admission: EM | Admit: 2020-11-07 | Discharge: 2020-11-07 | Disposition: A | Discharge status: Home / Self Care | Payer: Medicaid Other | Attending: Emergency Medicine | Admitting: Emergency Medicine

## 2020-11-07 ENCOUNTER — Other Ambulatory Visit: Payer: Self-pay

## 2020-11-07 DIAGNOSIS — S39012A Strain of muscle, fascia and tendon of lower back, initial encounter: Secondary | ICD-10-CM

## 2020-11-07 DIAGNOSIS — Z87891 Personal history of nicotine dependence: Secondary | ICD-10-CM | POA: Insufficient documentation

## 2020-11-07 DIAGNOSIS — M545 Low back pain, unspecified: Secondary | ICD-10-CM | POA: Insufficient documentation

## 2020-11-07 DIAGNOSIS — J45909 Unspecified asthma, uncomplicated: Secondary | ICD-10-CM | POA: Diagnosis not present

## 2020-11-07 MED ORDER — TRAMADOL HCL 50 MG PO TABS
50.0000 mg | ORAL_TABLET | Freq: Once | ORAL | Status: AC
  Administered 2020-11-07: 50 mg via ORAL
  Filled 2020-11-07: qty 1

## 2020-11-07 MED ORDER — METHOCARBAMOL 750 MG PO TABS
750.0000 mg | ORAL_TABLET | Freq: Three times a day (TID) | ORAL | 0 refills | Status: DC | PRN
Start: 2020-11-07 — End: 2021-04-18
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
## 2020-11-07 NOTE — Discharge Instructions (Addendum)
It was our pleasure to provide your ER care today - we hope that you feel better.  Take acetaminophen or ibuprofen as need for pain.  You may also take robaxin as need for muscle pain/spasm - no driving for the next 6 hours, or when taking robaxin.  Avoid bending at waist, or heavy lifting > 20 lbs for the next 3  days. Try heat therapy.  Follow up with primary care doctor in 1-2 weeks if symptoms fail to improve/resolve.  Return to ER if worse, new symptoms, fevers, numbness/weakness, intractable pain, problems with normal bowel or bladder function, or other concern.

## 2020-11-07 NOTE — ED Triage Notes (Signed)
Pt coming from home. Complaint of back pain after work. Pt states she was doing a lot of bending down at work.

## 2020-12-11 ENCOUNTER — Other Ambulatory Visit: Payer: Self-pay

## 2020-12-11 ENCOUNTER — Emergency Department (HOSPITAL_COMMUNITY)
Admission: EM | Admit: 2020-12-11 | Discharge: 2020-12-11 | Disposition: A | Discharge status: Home / Self Care | Payer: Medicaid Other | Attending: Emergency Medicine | Admitting: Emergency Medicine

## 2020-12-11 DIAGNOSIS — G43909 Migraine, unspecified, not intractable, without status migrainosus: Secondary | ICD-10-CM | POA: Diagnosis present

## 2020-12-11 DIAGNOSIS — Z87891 Personal history of nicotine dependence: Secondary | ICD-10-CM | POA: Insufficient documentation

## 2020-12-11 DIAGNOSIS — G43009 Migraine without aura, not intractable, without status migrainosus: Secondary | ICD-10-CM

## 2020-12-11 DIAGNOSIS — J45909 Unspecified asthma, uncomplicated: Secondary | ICD-10-CM | POA: Diagnosis not present

## 2020-12-11 MED ORDER — ONDANSETRON HCL 4 MG/2ML IJ SOLN
4.0000 mg | Freq: Once | INTRAMUSCULAR | Status: AC
  Administered 2020-12-11: 4 mg via INTRAVENOUS
  Filled 2020-12-11: qty 2

## 2020-12-11 MED ORDER — MAGNESIUM SULFATE IN D5W 1-5 GM/100ML-% IV SOLN
1.0000 g | Freq: Once | INTRAVENOUS | Status: AC
  Administered 2020-12-11: 1 g via INTRAVENOUS
  Filled 2020-12-11: qty 100

## 2020-12-11 MED ORDER — DIPHENHYDRAMINE HCL 50 MG/ML IJ SOLN
25.0000 mg | Freq: Once | INTRAMUSCULAR | Status: AC
  Administered 2020-12-11: 25 mg via INTRAVENOUS
  Filled 2020-12-11: qty 1

## 2020-12-11 MED ORDER — DEXAMETHASONE SODIUM PHOSPHATE 10 MG/ML IJ SOLN
10.0000 mg | Freq: Once | INTRAMUSCULAR | Status: AC
  Administered 2020-12-11: 10 mg via INTRAVENOUS
  Filled 2020-12-11: qty 1

## 2020-12-11 MED ORDER — KETOROLAC TROMETHAMINE 30 MG/ML IJ SOLN
30.0000 mg | Freq: Once | INTRAMUSCULAR | Status: AC
  Administered 2020-12-11: 30 mg via INTRAVENOUS
  Filled 2020-12-11: qty 1

## 2020-12-11 MED ORDER — HYDROMORPHONE HCL 1 MG/ML IJ SOLN
0.5000 mg | Freq: Once | INTRAMUSCULAR | Status: AC
  Administered 2020-12-11: 0.5 mg via INTRAVENOUS
  Filled 2020-12-11: qty 1

## 2020-12-11 MED ORDER — MORPHINE SULFATE (PF) 4 MG/ML IV SOLN
4.0000 mg | Freq: Once | INTRAVENOUS | Status: AC
  Administered 2020-12-11: 4 mg via INTRAVENOUS
  Filled 2020-12-11: qty 1

## 2020-12-11 NOTE — ED Triage Notes (Signed)
Pt reports migraine x 1 week with hx of same. Rx medication not working. Endorses neck pain, photophobia, and nausea.

## 2020-12-11 NOTE — Discharge Instructions (Signed)
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
## 2020-12-11 NOTE — ED Notes (Signed)
Pt resting in bed. Reporting insignificant improvement to HA

## 2020-12-13 ENCOUNTER — Other Ambulatory Visit: Payer: Self-pay

## 2020-12-13 ENCOUNTER — Encounter: Payer: Self-pay | Admitting: Neurology

## 2020-12-13 ENCOUNTER — Ambulatory Visit: Payer: Medicaid Other | Admitting: Neurology

## 2020-12-13 ENCOUNTER — Telehealth: Payer: Self-pay

## 2020-12-13 VITALS — BP 166/103 | HR 66 | Ht 65.5 in | Wt 262.0 lb

## 2020-12-13 DIAGNOSIS — G43009 Migraine without aura, not intractable, without status migrainosus: Secondary | ICD-10-CM | POA: Diagnosis not present

## 2020-12-13 DIAGNOSIS — R519 Headache, unspecified: Secondary | ICD-10-CM | POA: Diagnosis not present

## 2020-12-13 DIAGNOSIS — R0683 Snoring: Secondary | ICD-10-CM | POA: Diagnosis not present

## 2020-12-13 DIAGNOSIS — R030 Elevated blood-pressure reading, without diagnosis of hypertension: Secondary | ICD-10-CM

## 2020-12-13 DIAGNOSIS — R351 Nocturia: Secondary | ICD-10-CM

## 2020-12-13 MED ORDER — UBRELVY 50 MG PO TABS
50.0000 mg | ORAL_TABLET | ORAL | 3 refills | Status: DC | PRN
Start: 2020-12-13 — End: 2021-04-18

## 2020-12-13 NOTE — Telephone Encounter (Signed)
Received PA request for Ubrevly. Completed via CMM and sent to [facility]. Approved 12/13/20-12/13/21, PA # [id number]. Dr. Rexene Alberts advised me that triptans are contraindicated for this pt due to her HTN. [facility] informed.
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
## 2020-12-13 NOTE — Patient Instructions (Signed)
Your migraines are thankfully not very frequently as you can go a month or 2 without a migraine.  I do not believe we need to consider preventative medication at this time.  For as needed use I recommend Ubrelvy, 50 mg strength: Take 1 pill at onset of migraine headache, may repeat in 2 hours, no more than 2 pills in 24 hours, i.e. not to exceed 100 mg per 24 hours. May cause sedation and nausea.   If we need to consider a preventative medication down the road, we can consider one of the newer injectable medications that are monthly subcutaneous injections.  For your history of snoring and morning headaches, I would like to proceed with a sleep study to rule out obstructive sleep apnea.  If you have obstructive sleep apnea, I recommend treatment with a CPAP or AutoPap machine.  Treating sleep apnea may improve your headaches.  Since you have neck pain, please talk to your primary care about referral to a spine specialist.  You may also want to see a dermatologist for your skin related cyst in the back of your neck on the right side.  Your blood pressure is rather high today.  Please make a follow-up appointment with your primary care for management of your high blood pressure as elevated blood pressure values can contribute to recurrent headaches.

## 2020-12-18 ENCOUNTER — Telehealth: Payer: Self-pay

## 2020-12-18 NOTE — Telephone Encounter (Signed)
LVM for pt to call me back to schedule sleep study  

## 2020-12-27 ENCOUNTER — Ambulatory Visit (INDEPENDENT_AMBULATORY_CARE_PROVIDER_SITE_OTHER): Payer: Medicaid Other | Admitting: Neurology

## 2020-12-27 ENCOUNTER — Other Ambulatory Visit: Payer: Self-pay

## 2020-12-27 DIAGNOSIS — R351 Nocturia: Secondary | ICD-10-CM

## 2020-12-27 DIAGNOSIS — G4720 Circadian rhythm sleep disorder, unspecified type: Secondary | ICD-10-CM

## 2020-12-27 DIAGNOSIS — G43009 Migraine without aura, not intractable, without status migrainosus: Secondary | ICD-10-CM

## 2020-12-27 DIAGNOSIS — G4733 Obstructive sleep apnea (adult) (pediatric): Secondary | ICD-10-CM

## 2020-12-27 DIAGNOSIS — R030 Elevated blood-pressure reading, without diagnosis of hypertension: Secondary | ICD-10-CM

## 2020-12-27 DIAGNOSIS — R0683 Snoring: Secondary | ICD-10-CM

## 2020-12-27 DIAGNOSIS — R519 Headache, unspecified: Secondary | ICD-10-CM

## 2021-01-04 ENCOUNTER — Encounter: Payer: Self-pay | Admitting: Neurology

## 2021-01-04 NOTE — Addendum Note (Signed)
Addended by: Huston Foley on: 01/04/2021 05:07 PM   Modules accepted: Orders

## 2021-01-04 NOTE — Progress Notes (Signed)
Patient referred by ED for migraines, seen by me on 12/13/20, diagnostic PSG on 12/27/20.    Please call and notify the patient that the recent sleep study showed overall mild obstructive sleep apnea but severe sleep apnea when she is in rem sleep.  Given her history of recurrent headaches, and difficulty with sleep, I recommend treatment in the form of AutoPap therapy.  This means, that we don't have to bring her back for a second sleep study with CPAP, but will let her start using a so-called autoPAP machine at home, through a DME company (of her choice, or as per insurance requirement). The DME representative will educate her on how to use the machine, how to put the mask on, etc. I have placed an order in the chart. Please send referral, talk to patient, send report to referring MD. We will need a FU in sleep clinic for 10 weeks post-PAP set up, please arrange that with me or one of our NPs. Thanks,   Huston Foley, MD, PhD [facility])

## 2021-01-04 NOTE — Procedures (Signed)
PATIENT'S NAME:  Victoria Collier, Victoria Collier DOB:      November 09, 1971      MR#:    [id number]     DATE OF RECORDING: 12/27/2020 REFERRING M.D.:  ED Study Performed:   Baseline Polysomnogram HISTORY: 50 year old woman with a history of chronic neck pain, history of GI bleed, chronic back pain, asthma, arthritis, reflux disease, anxiety, and severe obesity with a BMI of over 40, who reports a longstanding history of migraines. She snores and has trouble sleeping. The patient endorsed the Epworth Sleepiness Scale at 1 points. The patient's weight 262 pounds with a height of 65 (inches), resulting in a BMI of 43.7 kg/m2. The patient's neck circumference measured 17 inches.  CURRENT MEDICATIONS: Robaxin, Flagyl, Aleve, Prilosec, Ubrelvy, Tylenol, Proventil, Ventolin, Xanax, Goody headache powder, Flexiril, Synthroid,   PROCEDURE:  This is a multichannel digital polysomnogram utilizing the Somnostar 11.2 system.  Electrodes and sensors were applied and monitored per AASM Specifications.   EEG, EOG, Chin and Limb EMG, were sampled at 200 Hz.  ECG, Snore and Nasal Pressure, Thermal Airflow, Respiratory Effort, CPAP Flow and Pressure, Oximetry was sampled at 50 Hz. Digital video and audio were recorded.      BASELINE STUDY  Lights Out was at 21:54 and Lights On at 04:42.  Total recording time (TRT) was 408 minutes, with a total sleep time (TST) of 317.5 minutes.   The patient's sleep latency was 49.5 minutes, which is delayed. REM latency was 48 minutes, which is mildly reduced. The sleep efficiency was 77.8%.     SLEEP ARCHITECTURE: WASO (Wake after sleep onset) was 42.5 minutes with mild sleep fragmentation noted. There were 43.5 minutes in Stage N1, 198 minutes Stage N2, 26 minutes Stage N3 and 50 minutes in Stage REM.  The percentage of Stage N1 was 13.7%, which is increased, Stage N2 was 62.4%, which is increased, Stage N3 was 8.2% and Stage R (REM sleep) was 15.7%, which is mildly reduced. The arousals were noted as:  18 were spontaneous, 0 were associated with PLMs, 10 were associated with respiratory events.  RESPIRATORY ANALYSIS:  There were a total of 46 respiratory events:  0 obstructive apneas, 0 central apneas and 0 mixed apneas with a total of 0 apneas and an apnea index (AI) of 0 /hour. There were 46 hypopneas with a hypopnea index of 8.7 /hour. The patient also had 0 respiratory event related arousals (RERAs).      The total APNEA/HYPOPNEA INDEX (AHI) was 8.7/hour and the total RESPIRATORY DISTURBANCE INDEX was  8.7 /hour.  34 events occurred in REM sleep and 24 events in NREM. The REM AHI was  40.8 /hour, versus a non-REM AHI of 2.7. The patient spent 118 minutes of total sleep time in the supine position and 200 minutes in non-supine.. The supine AHI was 6.1 versus a non-supine AHI of 10.2.  OXYGEN SATURATION & C02:  The Wake baseline 02 saturation was 94%, with the lowest being 79%. Time spent below 89% saturation equaled 17 minutes. PERIODIC LIMB MOVEMENTS: The patient had a total of 0 Periodic Limb Movements.  The Periodic Limb Movement (PLM) index was 0 and the PLM Arousal index was 0/hour.  Audio and video analysis did not show any abnormal or unusual movements, behaviors, phonations or vocalizations. The patient took 1 bathroom break. Mild to moderate snoring was noted. The EKG was in keeping with normal sinus rhythm (NSR).  Post-study, the patient indicated that sleep was worse than usual.    IMPRESSION:  1.  Obstructive Sleep Apnea (OSA) 2. Dysfunctions associated with sleep stages or arousal from sleep  RECOMMENDATIONS:  1. This study demonstrates overall mild obstructive sleep apnea, severe in REM sleep with a total AHI of 8.7/hour, REM AHI of 40.8/hour, supine AHI of 6.1/hour and O2 nadir of 79%. Given the patient's medical history and sleep related complaints, treatment with positive airway pressure is recommended; this can be achieved in the form of autoPAP. Alternatively, a full-night  CPAP titration study would allow optimization of therapy, if needed down the road. Other treatment options may include avoidance of supine sleep position along with weight loss, upper airway or jaw surgery in selected patients or the use of an oral appliance in certain patients. ENT evaluation and/or consultation with a maxillofacial surgeon or dentist may be feasible in some instances.    2. Please note that untreated obstructive sleep apnea may carry additional perioperative morbidity. Patients with significant obstructive sleep apnea should receive perioperative PAP therapy and the surgeons and particularly the anesthesiologist should be informed of the diagnosis and the severity of the sleep disordered breathing. 3. This study shows sleep fragmentation and abnormal sleep stage percentages; these are nonspecific findings and per se do not signify an intrinsic sleep disorder or a cause for the patient's sleep-related symptoms. Causes include (but are not limited to) the first night effect of the sleep study, circadian rhythm disturbances, medication effect or an underlying mood disorder or medical problem.  4. The patient should be cautioned not to drive, work at heights, or operate dangerous or heavy equipment when tired or sleepy. Review and reiteration of good sleep hygiene measures should be pursued with any patient. 5. The patient will be seen in follow-up by Dr. Frances Furbish at [facility] for discussion of the test results and further management strategies. The referring provider will be notified of the test results.  I certify that I have reviewed the entire raw data recording prior to the issuance of this report in accordance with the Standards of Accreditation of the American Academy of Sleep Medicine (AASM)  Huston Foley, MD, PhD Diplomat, American Board of Neurology and Sleep Medicine (Neurology and Sleep Medicine)

## 2021-01-05 ENCOUNTER — Telehealth: Payer: Self-pay

## 2021-01-05 NOTE — Telephone Encounter (Signed)
I called pt. I advised pt that Dr. 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 Dr. Rexene Alberts recommends that pt start an auto pap at home. I reviewed PAP compliance expectations with the pt. Pt is agreeable to starting an auto-PAP. I advised pt that an order will be sent to a DME, [facility], and [facility] will call the pt within about one week after they file with the pt's insurance. [facility] will show the pt how to use the machine, fit for masks, and troubleshoot the auto-PAP if needed. I explained to pt that per insurance requirements she will need a follow up 31-90 days after starting the auto-pap. Pt declined to make this appt right now and said she would call us back. A letter with all of this information in it will be sent to the pt's mychart account to the pt as a reminder. I verified with the pt that the address we have on file is correct. Pt verbalized understanding of results. Pt had no questions at this time but was encouraged to call back if questions arise. I have sent the order to [facility] and have received confirmation that they have received the order.

## 2021-01-05 NOTE — Telephone Encounter (Signed)
-----   Message from Star Age, MD sent at 01/04/2021  5:07 PM EST ----- Patient referred by ED for migraines, seen by me on 12/13/20, diagnostic PSG on 12/27/20.    Please call and notify the patient that the recent sleep study showed overall mild obstructive sleep apnea but severe sleep apnea when she is in rem sleep.  Given her history of recurrent headaches, and difficulty with sleep, I recommend treatment in the form of AutoPap therapy.  This means, that we don't have to bring her back for a second sleep study with CPAP, but will let her start using a so-called autoPAP machine at home, through a DME company (of her choice, or as per insurance requirement). The DME representative will educate her on how to use the machine, how to put the mask on, etc. I have placed an order in the chart. Please send referral, talk to patient, send report to referring MD. We will need a FU in sleep clinic for 10 weeks post-PAP set up, please arrange that with me or one of our NPs. Thanks,   Star Age, MD, PhD [facility])

## 2021-01-21 ENCOUNTER — Emergency Department (HOSPITAL_COMMUNITY): Payer: Medicaid Other

## 2021-01-21 ENCOUNTER — Emergency Department (HOSPITAL_COMMUNITY)
Admission: EM | Admit: 2021-01-21 | Discharge: 2021-01-22 | Disposition: A | Discharge status: Home / Self Care | Payer: Medicaid Other | Attending: Emergency Medicine | Admitting: Emergency Medicine

## 2021-01-21 ENCOUNTER — Other Ambulatory Visit: Payer: Self-pay

## 2021-01-21 DIAGNOSIS — W19XXXA Unspecified fall, initial encounter: Secondary | ICD-10-CM | POA: Insufficient documentation

## 2021-01-21 DIAGNOSIS — Y92002 Bathroom of unspecified non-institutional (private) residence single-family (private) house as the place of occurrence of the external cause: Secondary | ICD-10-CM | POA: Diagnosis not present

## 2021-01-21 DIAGNOSIS — J45909 Unspecified asthma, uncomplicated: Secondary | ICD-10-CM | POA: Insufficient documentation

## 2021-01-21 DIAGNOSIS — S34109A Unspecified injury to unspecified level of lumbar spinal cord, initial encounter: Secondary | ICD-10-CM | POA: Diagnosis present

## 2021-01-21 DIAGNOSIS — Z87891 Personal history of nicotine dependence: Secondary | ICD-10-CM | POA: Diagnosis not present

## 2021-01-21 DIAGNOSIS — S39012A Strain of muscle, fascia and tendon of lower back, initial encounter: Secondary | ICD-10-CM | POA: Diagnosis not present

## 2021-01-21 NOTE — ED Triage Notes (Signed)
C/o low back pain. Stated started Thursday when she was going to the bathroom and gave out on her and fell backwards

## 2021-01-22 MED ORDER — KETOROLAC TROMETHAMINE 60 MG/2ML IM SOLN
30.0000 mg | Freq: Once | INTRAMUSCULAR | Status: AC
  Administered 2021-01-22: 30 mg via INTRAMUSCULAR
  Filled 2021-01-22: qty 2

## 2021-01-22 MED ORDER — DIAZEPAM 5 MG PO TABS: 2.5000 mg | ORAL_TABLET | Freq: Four times a day (QID) | ORAL | 0 refills | Status: DC | PRN

## 2021-01-22 MED ORDER — OXYCODONE-ACETAMINOPHEN 5-325 MG PO TABS
2.0000 | ORAL_TABLET | Freq: Once | ORAL | Status: AC
  Administered 2021-01-22: 2 via ORAL
  Filled 2021-01-22: qty 2
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
## 2021-02-14 ENCOUNTER — Emergency Department (HOSPITAL_COMMUNITY)
Admission: EM | Admit: 2021-02-14 | Discharge: 2021-02-14 | Disposition: A | Discharge status: Home / Self Care | Payer: Medicaid Other | Attending: Emergency Medicine | Admitting: Emergency Medicine

## 2021-02-14 ENCOUNTER — Encounter (HOSPITAL_COMMUNITY): Payer: Self-pay | Admitting: Emergency Medicine

## 2021-02-14 DIAGNOSIS — R059 Cough, unspecified: Secondary | ICD-10-CM | POA: Diagnosis present

## 2021-02-14 DIAGNOSIS — J45909 Unspecified asthma, uncomplicated: Secondary | ICD-10-CM | POA: Diagnosis not present

## 2021-02-14 DIAGNOSIS — Z87891 Personal history of nicotine dependence: Secondary | ICD-10-CM | POA: Insufficient documentation

## 2021-02-14 DIAGNOSIS — U071 COVID-19: Secondary | ICD-10-CM | POA: Insufficient documentation

## 2021-02-14 LAB — SARS CORONAVIRUS 2 (TAT 6-24 HRS): SARS Coronavirus 2: POSITIVE — AB

## 2021-02-14 MED ORDER — ACETAMINOPHEN 325 MG PO TABS
650.0000 mg | ORAL_TABLET | Freq: Once | ORAL | Status: AC
  Administered 2021-02-14: 650 mg via ORAL
  Filled 2021-02-14: qty 2

## 2021-02-14 NOTE — ED Triage Notes (Addendum)
Pt arrives to ED with chief of headache, sore throat and dry cough. These sx started last night.  Pt took 2 positive rapid tests and has pictures on her phone.
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
## 2021-02-15 ENCOUNTER — Telehealth: Payer: Self-pay | Admitting: Family

## 2021-02-15 ENCOUNTER — Other Ambulatory Visit: Payer: Self-pay | Admitting: Family

## 2021-02-15 ENCOUNTER — Telehealth: Payer: Self-pay

## 2021-02-15 DIAGNOSIS — U071 COVID-19: Secondary | ICD-10-CM

## 2021-02-15 DIAGNOSIS — Z609 Problem related to social environment, unspecified: Secondary | ICD-10-CM

## 2021-02-15 DIAGNOSIS — Z720 Tobacco use: Secondary | ICD-10-CM

## 2021-02-15 NOTE — Telephone Encounter (Signed)
Follow up call from previous.   Victoria Collier was seen in the ED on 2/1 for cough and congestion. Found to have positive Covid test. She has received her vaccines and does not have a booster. Qualifying factors include BMI 41, elevated SVI score, tobacco use, and history of asthma.  Symptom onset was 02/13/21.   I discussed the risks, benefits and potential financial costs associated with treatment with Sotrovimab. She wishes to proceed with treatment.  Victoria Collier,   We contacted you because you were recently diagnosed with COVID-19 and may benefit from a new treatment for mild to moderate disease. This treatment helps reduce the chance of being hospitalized. For some patients with medical conditions that may increase the chances of an infection, the treatment also decreases the risk for serious symptoms related to COVID-19.   The Food and Drug Administration (FDA) approved emergency use of a new drug to treat patients with mild to moderate symptoms who have risk factors that could cause severe symptoms related to COVID-19. This new treatment is a monoclonal antibody. It works by attaching like a magnet to the SARS-CoV2 virus (the virus that causes COVID-19) and stops it from infecting more cells in your body. It does not kill the virus, but it prevents it from spreading throughout your body with the hope that it will decrease your symptoms after it is administered.   This new drug is an intravenous (IV) infusion called Sotrovimab that is given over one 30-minute session in our [facility] outpatient infusion clinic. You will need to stay about 60 minutes after the infusion to ensure you are tolerating it well and to watch for any allergic reaction to the medication. More information will be given to you at the time of your appointment.   Important information:  . The potential side effects: 2-4% of recipients experience nausea, vomiting, diarrhea, dizziness, headaches, itching, worsening  fevers or chills for around 24 hours. . There have been no serious infusion-related reactions. . Of the more than 3,000 patients who received the infusion, only one had an allergic response that ended once the infusion was stopped. This is why we monitor all of our patients closely for 60 minutes after the infusion.  . The COVID-19 vaccine (including boosters) must be delayed at least 90 days after receiving this infusion.  . The medication itself is free, but your insurance will be charged an infusion fee. The amount you may owe later varies from insurance to insurance. If you do not have insurance, we can put you in touch with our billing department. Please contact your insurance agent to discuss prior to your appointment if you would like further details about billing specific to your policy. The CMS code is: M62  . If you have been tested outside of a [facility], you MUST bring a copy of your positive test with you the morning of your appointment. You may take a photo of this and upload to your MyChart portal,  have the testing facility fax the result to [phone] or email a copy to [email or web address].    You have been scheduled to receive the monoclonal antibody therapy at [place]:  02/17/21 at 10:30 am   The address for the infusion clinic site is:   --The GPS address is [address], and the parking is located near the [employer] building where you will see a "COVID19 Infusion" feather banner marking the entrance to parking. (See photos below.)            --  Enter into the 2nd entrance where the "wave, flag banner" is at the road. Turn into this 2nd entrance and immediately turn left or right to park in one of the marked spaces.   --Please stay in your car and call the desk for assistance inside at [phone]. Let us know which space you are in.    --The average time in the department is roughly 90 minutes to two hours for monoclonal treatment.  This includes preparation of the medication, IV start and the required 60-minute monitoring after the infusion.     Should you develop worsening shortness of breath, chest pain or severe breathing problems, please do not wait for this appointment and go to the emergency room for evaluation and treatment instead. You will undergo another oxygen screen before your infusion to ensure this is the best treatment option for you. There is a chance that the best decision may be to send you to the emergency room for evaluation at the time of your appointment.    The day of your visit, you should: Marland Kitchen Get plenty of rest the night before and drink plenty of water. . Eat a light meal/snack before coming and take your medications as prescribed.  . Wear warm, comfortable clothes with a shirt that can roll-up over the elbow (will need IV start).  . Wear a mask.  . Consider bringing an activity to help pass the time.  Terri Piedra, NP 02/15/2021 10:04 AM

## 2021-02-15 NOTE — Telephone Encounter (Signed)
Called to discuss with patient about COVID-19 symptoms and the use of one of the available treatments for those with mild to moderate Covid symptoms and at a high risk of hospitalization.  Pt appears to qualify for outpatient treatment due to co-morbid conditions and/or a member of an at-risk group in accordance with the FDA Emergency Use Authorization.    Symptom onset: 02/13/21 Cough, headache,sore throat, fatigue Vaccinated: Yes Booster? No Immunocompromised? No Qualifiers: Asthma  Pt. Would like to speak with APP.  Victoria Collier

## 2021-02-15 NOTE — Progress Notes (Signed)
I connected by phone with Victoria Collier on 02/15/2021 at 10:06 AM to discuss the potential use of a new treatment for mild to moderate COVID-19 viral infection in non-hospitalized patients.  This patient is a 51 y.o. female that meets the FDA criteria for Emergency Use Authorization of COVID monoclonal antibody sotrovimab.  Has a (+) direct SARS-CoV-2 viral test result  Has mild or moderate COVID-19   Is NOT hospitalized due to COVID-19  Is within 10 days of symptom onset  Has at least one of the high risk factor(s) for progression to severe COVID-19 and/or hospitalization as defined in EUA.  Specific high risk criteria : BMI > 25 and Other high risk medical condition per CDC:  Tobacco use, Elevated SVI score   I have spoken and communicated the following to the patient or parent/caregiver regarding COVID monoclonal antibody treatment:  1. FDA has authorized the emergency use for the treatment of mild to moderate COVID-19 in adults and pediatric patients with positive results of direct SARS-CoV-2 viral testing who are 1 years of age and older weighing at least 40 kg, and who are at high risk for progressing to severe COVID-19 and/or hospitalization.  2. The significant known and potential risks and benefits of COVID monoclonal antibody, and the extent to which such potential risks and benefits are unknown.  3. Information on available alternative treatments and the risks and benefits of those alternatives, including clinical trials.  4. Patients treated with COVID monoclonal antibody should continue to self-isolate and use infection control measures (e.g., wear mask, isolate, social distance, avoid sharing personal items, clean and disinfect "high touch" surfaces, and frequent handwashing) according to CDC guidelines.   5. The patient or parent/caregiver has the option to accept or refuse COVID monoclonal antibody treatment.  After reviewing this information with the patient, the  patient has agreed to receive one of the available covid 19 monoclonal antibodies and will be provided an appropriate fact sheet prior to infusion. Mauricio Po, FNP 02/15/2021 10:06 AM

## 2021-02-17 ENCOUNTER — Ambulatory Visit (HOSPITAL_COMMUNITY)
Admission: RE | Admit: 2021-02-17 | Discharge: 2021-02-17 | Disposition: A | Discharge status: Home / Self Care | Payer: Medicaid Other | Source: Ambulatory Visit | Attending: Pulmonary Disease | Admitting: Pulmonary Disease

## 2021-02-17 DIAGNOSIS — Z720 Tobacco use: Secondary | ICD-10-CM

## 2021-02-17 DIAGNOSIS — U071 COVID-19: Secondary | ICD-10-CM | POA: Diagnosis not present

## 2021-02-17 DIAGNOSIS — Z609 Problem related to social environment, unspecified: Secondary | ICD-10-CM

## 2021-02-17 MED ORDER — METHYLPREDNISOLONE SODIUM SUCC 125 MG IJ SOLR: 125.0000 mg | Freq: Once | INTRAMUSCULAR | Status: DC | PRN

## 2021-02-17 MED ORDER — FAMOTIDINE IN NACL 20-0.9 MG/50ML-% IV SOLN: 20.0000 mg | Freq: Once | INTRAVENOUS | Status: DC | PRN

## 2021-02-17 MED ORDER — SODIUM CHLORIDE 0.9 % IV SOLN: INTRAVENOUS | Status: DC | PRN

## 2021-02-17 MED ORDER — ALBUTEROL SULFATE HFA 108 (90 BASE) MCG/ACT IN AERS: 2.0000 | INHALATION_SPRAY | Freq: Once | RESPIRATORY_TRACT | Status: DC | PRN

## 2021-02-17 MED ORDER — SOTROVIMAB 500 MG/8ML IV SOLN
500.0000 mg | Freq: Once | INTRAVENOUS | Status: AC
  Administered 2021-02-17: 500 mg via INTRAVENOUS

## 2021-02-17 MED ORDER — EPINEPHRINE 0.3 MG/0.3ML IJ SOAJ: 0.3000 mg | Freq: Once | INTRAMUSCULAR | Status: DC | PRN

## 2021-02-17 MED ORDER — DIPHENHYDRAMINE HCL 50 MG/ML IJ SOLN: 50.0000 mg | Freq: Once | INTRAMUSCULAR | Status: DC | PRN

## 2021-02-17 NOTE — Progress Notes (Signed)
Diagnosis: COVID-19  Physician: Dr. Patrick Wright  Procedure: [facility] Med: Sotrovimab infusion - Provided patient with sotrovimab fact sheet for patients, parents, and caregivers prior to infusion.   Complications: No immediate complications noted  Discharge: Discharged home    

## 2021-02-17 NOTE — Progress Notes (Signed)
Patient reviewed Fact Sheet for Patients, Parents, and Caregivers for Emergency Use Authorization (EUA) of sotrovimab for the Treatment of Coronavirus. Patient also reviewed and is agreeable to the estimated cost of treatment. Patient is agreeable to proceed.   

## 2021-02-17 NOTE — Discharge Instructions (Signed)

## 2021-03-08 ENCOUNTER — Emergency Department (HOSPITAL_BASED_OUTPATIENT_CLINIC_OR_DEPARTMENT_OTHER)
Admission: EM | Admit: 2021-03-08 | Discharge: 2021-03-08 | Disposition: A | Discharge status: Home / Self Care | Payer: No Typology Code available for payment source | Attending: Emergency Medicine | Admitting: Emergency Medicine

## 2021-03-08 ENCOUNTER — Other Ambulatory Visit: Payer: Self-pay

## 2021-03-08 ENCOUNTER — Encounter (HOSPITAL_BASED_OUTPATIENT_CLINIC_OR_DEPARTMENT_OTHER): Payer: Self-pay | Admitting: *Deleted

## 2021-03-08 DIAGNOSIS — Z87891 Personal history of nicotine dependence: Secondary | ICD-10-CM | POA: Insufficient documentation

## 2021-03-08 DIAGNOSIS — G43001 Migraine without aura, not intractable, with status migrainosus: Secondary | ICD-10-CM | POA: Insufficient documentation

## 2021-03-08 DIAGNOSIS — Z7982 Long term (current) use of aspirin: Secondary | ICD-10-CM | POA: Diagnosis not present

## 2021-03-08 DIAGNOSIS — J45909 Unspecified asthma, uncomplicated: Secondary | ICD-10-CM | POA: Insufficient documentation

## 2021-03-08 DIAGNOSIS — R519 Headache, unspecified: Secondary | ICD-10-CM | POA: Diagnosis present

## 2021-03-08 MED ORDER — SODIUM CHLORIDE 0.9 % IV SOLN: INTRAVENOUS | Status: DC | PRN

## 2021-03-08 MED ORDER — METOCLOPRAMIDE HCL 5 MG/ML IJ SOLN
10.0000 mg | Freq: Once | INTRAMUSCULAR | Status: AC
  Administered 2021-03-08: 10 mg via INTRAVENOUS
  Filled 2021-03-08: qty 2

## 2021-03-08 MED ORDER — DIPHENHYDRAMINE HCL 50 MG/ML IJ SOLN
25.0000 mg | Freq: Once | INTRAMUSCULAR | Status: AC
  Administered 2021-03-08: 25 mg via INTRAVENOUS
  Filled 2021-03-08: qty 1

## 2021-03-08 MED ORDER — MAGNESIUM SULFATE 2 GM/50ML IV SOLN
2.0000 g | Freq: Once | INTRAVENOUS | Status: AC
  Administered 2021-03-08: 2 g via INTRAVENOUS
  Filled 2021-03-08: qty 50

## 2021-03-08 MED ORDER — KETOROLAC TROMETHAMINE 30 MG/ML IJ SOLN
15.0000 mg | Freq: Once | INTRAMUSCULAR | Status: AC
  Administered 2021-03-08: 15 mg via INTRAVENOUS
  Filled 2021-03-08: qty 1

## 2021-03-08 NOTE — ED Notes (Signed)
IV removed, per Dr. Maryan Rued
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
## 2021-03-08 NOTE — ED Triage Notes (Signed)
Headache since yesterday.  History of migraine.

## 2021-03-09 ENCOUNTER — Other Ambulatory Visit (HOSPITAL_BASED_OUTPATIENT_CLINIC_OR_DEPARTMENT_OTHER): Payer: Self-pay | Admitting: Family Medicine

## 2021-03-09 DIAGNOSIS — Z1231 Encounter for screening mammogram for malignant neoplasm of breast: Secondary | ICD-10-CM

## 2021-03-12 ENCOUNTER — Other Ambulatory Visit: Payer: Self-pay

## 2021-03-12 ENCOUNTER — Ambulatory Visit (HOSPITAL_BASED_OUTPATIENT_CLINIC_OR_DEPARTMENT_OTHER)
Admission: RE | Admit: 2021-03-12 | Discharge: 2021-03-12 | Disposition: A | Discharge status: Home / Self Care | Payer: No Typology Code available for payment source | Source: Ambulatory Visit | Attending: Family Medicine | Admitting: Family Medicine

## 2021-03-12 DIAGNOSIS — Z1231 Encounter for screening mammogram for malignant neoplasm of breast: Secondary | ICD-10-CM | POA: Insufficient documentation

## 2021-03-16 ENCOUNTER — Encounter: Payer: Self-pay | Admitting: Neurology

## 2021-03-28 ENCOUNTER — Other Ambulatory Visit: Payer: Self-pay

## 2021-03-28 ENCOUNTER — Emergency Department (HOSPITAL_BASED_OUTPATIENT_CLINIC_OR_DEPARTMENT_OTHER)
Admission: EM | Admit: 2021-03-28 | Discharge: 2021-03-28 | Disposition: A | Discharge status: Home / Self Care | Payer: No Typology Code available for payment source | Attending: Emergency Medicine | Admitting: Emergency Medicine

## 2021-03-28 ENCOUNTER — Encounter (HOSPITAL_BASED_OUTPATIENT_CLINIC_OR_DEPARTMENT_OTHER): Payer: Self-pay | Admitting: Emergency Medicine

## 2021-03-28 DIAGNOSIS — Z87891 Personal history of nicotine dependence: Secondary | ICD-10-CM | POA: Insufficient documentation

## 2021-03-28 DIAGNOSIS — J45909 Unspecified asthma, uncomplicated: Secondary | ICD-10-CM | POA: Insufficient documentation

## 2021-03-28 DIAGNOSIS — Z7982 Long term (current) use of aspirin: Secondary | ICD-10-CM | POA: Insufficient documentation

## 2021-03-28 DIAGNOSIS — R110 Nausea: Secondary | ICD-10-CM | POA: Diagnosis present

## 2021-03-28 DIAGNOSIS — R531 Weakness: Secondary | ICD-10-CM | POA: Insufficient documentation

## 2021-03-28 DIAGNOSIS — G43001 Migraine without aura, not intractable, with status migrainosus: Secondary | ICD-10-CM | POA: Insufficient documentation

## 2021-03-28 MED ORDER — PROCHLORPERAZINE EDISYLATE 10 MG/2ML IJ SOLN
10.0000 mg | Freq: Once | INTRAMUSCULAR | Status: AC
  Administered 2021-03-28: 10 mg via INTRAMUSCULAR
  Filled 2021-03-28: qty 2

## 2021-03-28 MED ORDER — DEXAMETHASONE 6 MG PO TABS
10.0000 mg | ORAL_TABLET | Freq: Once | ORAL | Status: AC
  Administered 2021-03-28: 10 mg via ORAL
  Filled 2021-03-28: qty 1

## 2021-03-28 MED ORDER — DIPHENHYDRAMINE HCL 50 MG/ML IJ SOLN
25.0000 mg | Freq: Once | INTRAMUSCULAR | Status: AC
  Administered 2021-03-28: 25 mg via INTRAMUSCULAR
  Filled 2021-03-28: qty 1
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
## 2021-03-28 NOTE — ED Triage Notes (Signed)
Reports recurrent migraine headache, no relief despite meds. Photophobia. No nausea . Alert and oriented x 4, ambulatory to room with steady gait.

## 2021-03-28 NOTE — Discharge Instructions (Signed)
Follow up with a neurologist in the office.  Return for worsening pain, fever, one-sided numbness or weakness difficulty with speech or swallowing.

## 2021-03-29 ENCOUNTER — Other Ambulatory Visit (HOSPITAL_BASED_OUTPATIENT_CLINIC_OR_DEPARTMENT_OTHER): Payer: Self-pay

## 2021-03-29 MED ORDER — NARATRIPTAN HCL 2.5 MG PO TABS
2.5000 mg | ORAL_TABLET | Freq: Two times a day (BID) | ORAL | 11 refills | Status: AC
  Filled 2021-03-29: qty 8, 14d supply, fill #0
  Filled 2021-04-17: qty 18, 30d supply, fill #1
  Filled 2021-05-09 – 2021-06-04 (×4): qty 18, 30d supply, fill #2

## 2021-03-30 ENCOUNTER — Other Ambulatory Visit (HOSPITAL_BASED_OUTPATIENT_CLINIC_OR_DEPARTMENT_OTHER): Payer: Self-pay

## 2021-04-01 ENCOUNTER — Other Ambulatory Visit (HOSPITAL_COMMUNITY): Payer: Self-pay

## 2021-04-04 ENCOUNTER — Other Ambulatory Visit (HOSPITAL_BASED_OUTPATIENT_CLINIC_OR_DEPARTMENT_OTHER): Payer: Self-pay

## 2021-04-04 MED ORDER — OMEPRAZOLE 20 MG PO CPDR
20.0000 mg | DELAYED_RELEASE_CAPSULE | Freq: Every day | ORAL | 10 refills | Status: AC
  Filled 2021-04-04: qty 30, 30d supply, fill #0
  Filled 2021-06-05: qty 30, 30d supply, fill #1

## 2021-04-09 ENCOUNTER — Encounter (HOSPITAL_COMMUNITY): Payer: Self-pay | Admitting: Emergency Medicine

## 2021-04-09 ENCOUNTER — Other Ambulatory Visit: Payer: Self-pay

## 2021-04-09 ENCOUNTER — Ambulatory Visit (HOSPITAL_COMMUNITY)
Admission: EM | Admit: 2021-04-09 | Discharge: 2021-04-09 | Disposition: A | Discharge status: Home / Self Care | Payer: No Typology Code available for payment source | Attending: Urgent Care | Admitting: Urgent Care

## 2021-04-09 ENCOUNTER — Other Ambulatory Visit (HOSPITAL_BASED_OUTPATIENT_CLINIC_OR_DEPARTMENT_OTHER): Payer: Self-pay

## 2021-04-09 DIAGNOSIS — Z8669 Personal history of other diseases of the nervous system and sense organs: Secondary | ICD-10-CM

## 2021-04-09 DIAGNOSIS — H1012 Acute atopic conjunctivitis, left eye: Secondary | ICD-10-CM

## 2021-04-09 MED ORDER — LEVOTHYROXINE SODIUM 125 MCG PO TABS
125.0000 ug | ORAL_TABLET | Freq: Every day | ORAL | 2 refills | Status: DC
  Filled 2021-04-09 (×2): qty 30, 30d supply, fill #0

## 2021-04-09 MED ORDER — AZELASTINE HCL 0.05 % OP SOLN
1.0000 [drp] | Freq: Two times a day (BID) | OPHTHALMIC | 0 refills | Status: DC
  Filled 2021-04-13 (×2): qty 6, 30d supply, fill #0

## 2021-04-09 MED ORDER — ALBUTEROL SULFATE HFA 108 (90 BASE) MCG/ACT IN AERS
2.0000 | INHALATION_SPRAY | RESPIRATORY_TRACT | 2 refills | Status: DC
  Filled 2021-04-09: qty 8.5, 17d supply, fill #0

## 2021-04-09 MED ORDER — POLYMYXIN B-TRIMETHOPRIM 10000-0.1 UNIT/ML-% OP SOLN: 1.0000 [drp] | OPHTHALMIC | 0 refills | Status: DC

## 2021-04-09 NOTE — ED Triage Notes (Signed)
[facility].  Vision today is no different than prior to getting contacts.  Patient reports "cold" drainage from left eye all day, every day, this has been going on over a year.
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
## 2021-04-10 ENCOUNTER — Other Ambulatory Visit (HOSPITAL_BASED_OUTPATIENT_CLINIC_OR_DEPARTMENT_OTHER): Payer: Self-pay

## 2021-04-12 ENCOUNTER — Other Ambulatory Visit (HOSPITAL_BASED_OUTPATIENT_CLINIC_OR_DEPARTMENT_OTHER): Payer: Self-pay

## 2021-04-13 ENCOUNTER — Other Ambulatory Visit (HOSPITAL_BASED_OUTPATIENT_CLINIC_OR_DEPARTMENT_OTHER): Payer: Self-pay

## 2021-04-16 ENCOUNTER — Other Ambulatory Visit (HOSPITAL_BASED_OUTPATIENT_CLINIC_OR_DEPARTMENT_OTHER): Payer: Self-pay

## 2021-04-17 ENCOUNTER — Other Ambulatory Visit (HOSPITAL_BASED_OUTPATIENT_CLINIC_OR_DEPARTMENT_OTHER): Payer: Self-pay

## 2021-04-18 ENCOUNTER — Other Ambulatory Visit: Payer: Self-pay

## 2021-04-18 ENCOUNTER — Encounter (HOSPITAL_COMMUNITY): Payer: Self-pay

## 2021-04-18 ENCOUNTER — Emergency Department (HOSPITAL_COMMUNITY)
Admission: EM | Admit: 2021-04-18 | Discharge: 2021-04-18 | Disposition: A | Discharge status: Home / Self Care | Payer: No Typology Code available for payment source | Attending: Emergency Medicine | Admitting: Emergency Medicine

## 2021-04-18 ENCOUNTER — Other Ambulatory Visit (HOSPITAL_BASED_OUTPATIENT_CLINIC_OR_DEPARTMENT_OTHER): Payer: Self-pay

## 2021-04-18 DIAGNOSIS — H538 Other visual disturbances: Secondary | ICD-10-CM | POA: Diagnosis not present

## 2021-04-18 DIAGNOSIS — Z8669 Personal history of other diseases of the nervous system and sense organs: Secondary | ICD-10-CM | POA: Insufficient documentation

## 2021-04-18 DIAGNOSIS — J45909 Unspecified asthma, uncomplicated: Secondary | ICD-10-CM | POA: Diagnosis not present

## 2021-04-18 DIAGNOSIS — R519 Headache, unspecified: Secondary | ICD-10-CM | POA: Insufficient documentation

## 2021-04-18 DIAGNOSIS — R110 Nausea: Secondary | ICD-10-CM | POA: Insufficient documentation

## 2021-04-18 DIAGNOSIS — Z87891 Personal history of nicotine dependence: Secondary | ICD-10-CM | POA: Insufficient documentation

## 2021-04-18 MED ORDER — NARATRIPTAN HCL 2.5 MG PO TABS: 2.5000 mg | ORAL_TABLET | Freq: Two times a day (BID) | ORAL | 0 refills | Status: DC

## 2021-04-18 MED ORDER — PROCHLORPERAZINE EDISYLATE 10 MG/2ML IJ SOLN
10.0000 mg | Freq: Once | INTRAMUSCULAR | Status: AC
  Administered 2021-04-18: 10 mg via INTRAVENOUS
  Filled 2021-04-18: qty 2

## 2021-04-18 MED ORDER — DIPHENHYDRAMINE HCL 50 MG/ML IJ SOLN
25.0000 mg | Freq: Once | INTRAMUSCULAR | Status: AC
  Administered 2021-04-18: 25 mg via INTRAVENOUS
  Filled 2021-04-18: qty 1

## 2021-04-18 NOTE — Discharge Instructions (Signed)
Please make sure to follow up with your neurologist. Return to the ER for any new or worsening symptoms

## 2021-04-18 NOTE — ED Notes (Signed)
Patient verbalizes understanding of discharge instructions. Opportunity for questioning and answers were provided. Armband removed by staff, pt discharged from ED.  
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
## 2021-04-18 NOTE — ED Triage Notes (Signed)
C/o migraine x 6 days - ran out of naratriptan and tried taking verapamil last night but has not given relief.   Experiencing photophobia and mild nausea.

## 2021-04-19 ENCOUNTER — Other Ambulatory Visit (HOSPITAL_BASED_OUTPATIENT_CLINIC_OR_DEPARTMENT_OTHER): Payer: Self-pay

## 2021-04-26 ENCOUNTER — Other Ambulatory Visit (HOSPITAL_BASED_OUTPATIENT_CLINIC_OR_DEPARTMENT_OTHER): Payer: Self-pay

## 2021-04-27 ENCOUNTER — Encounter (HOSPITAL_COMMUNITY): Payer: Self-pay

## 2021-04-27 ENCOUNTER — Ambulatory Visit (HOSPITAL_BASED_OUTPATIENT_CLINIC_OR_DEPARTMENT_OTHER): Payer: No Typology Code available for payment source | Admitting: Nurse Practitioner

## 2021-04-27 ENCOUNTER — Emergency Department (HOSPITAL_COMMUNITY)
Admission: EM | Admit: 2021-04-27 | Discharge: 2021-04-27 | Disposition: A | Discharge status: Home / Self Care | Payer: PRIVATE HEALTH INSURANCE | Attending: Emergency Medicine | Admitting: Emergency Medicine

## 2021-04-27 ENCOUNTER — Other Ambulatory Visit: Payer: Self-pay

## 2021-04-27 DIAGNOSIS — Z87891 Personal history of nicotine dependence: Secondary | ICD-10-CM | POA: Insufficient documentation

## 2021-04-27 DIAGNOSIS — J45909 Unspecified asthma, uncomplicated: Secondary | ICD-10-CM | POA: Insufficient documentation

## 2021-04-27 DIAGNOSIS — H5789 Other specified disorders of eye and adnexa: Secondary | ICD-10-CM | POA: Diagnosis not present

## 2021-04-27 MED ORDER — TETRACAINE HCL 0.5 % OP SOLN
2.0000 [drp] | Freq: Once | OPHTHALMIC | Status: AC
  Administered 2021-04-27: 2 [drp] via OPHTHALMIC
  Filled 2021-04-27: qty 4

## 2021-04-27 MED ORDER — TOBRAMYCIN-DEXAMETHASONE 0.3-0.1 % OP SUSP
1.0000 [drp] | Freq: Four times a day (QID) | OPHTHALMIC | 0 refills | Status: DC
  Filled 2021-04-30 (×2): qty 5, 25d supply, fill #0

## 2021-04-27 MED ORDER — FLUORESCEIN SODIUM 1 MG OP STRP
1.0000 | ORAL_STRIP | Freq: Once | OPHTHALMIC | Status: AC
Start: 2021-04-27 — End: 2021-04-27
  Administered 2021-04-27: 1 via OPHTHALMIC
  Filled 2021-04-27: qty 1
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
## 2021-04-27 NOTE — ED Notes (Signed)
Pa  at bedside. 
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
## 2021-04-27 NOTE — ED Notes (Signed)
Visual acuity left eye 20/63, right eye 20/80, together 20/80
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
## 2021-04-27 NOTE — Discharge Instructions (Addendum)
You came to the emergency department today to be evaluated for your eye irritation.  Your physical exam was reassuring.  I spoke with the ophthalmologist to recommended starting eyedrops.  Please put 1 drop in each eye 4 times a day.  Please call the ophthalmologist Dr. Alanda Slim on Monday to set up an appointment for next week.  Get help right away if: You have severe eye pain that does not get better with medicine. You have vision loss.

## 2021-04-30 ENCOUNTER — Other Ambulatory Visit (HOSPITAL_BASED_OUTPATIENT_CLINIC_OR_DEPARTMENT_OTHER): Payer: Self-pay

## 2021-05-01 ENCOUNTER — Ambulatory Visit (HOSPITAL_BASED_OUTPATIENT_CLINIC_OR_DEPARTMENT_OTHER): Payer: No Typology Code available for payment source | Admitting: Family Medicine

## 2021-05-02 ENCOUNTER — Telehealth: Payer: Self-pay | Admitting: Neurology

## 2021-05-02 ENCOUNTER — Ambulatory Visit (INDEPENDENT_AMBULATORY_CARE_PROVIDER_SITE_OTHER): Payer: No Typology Code available for payment source | Admitting: Family Medicine

## 2021-05-02 ENCOUNTER — Encounter (HOSPITAL_BASED_OUTPATIENT_CLINIC_OR_DEPARTMENT_OTHER): Payer: Self-pay | Admitting: Family Medicine

## 2021-05-02 ENCOUNTER — Other Ambulatory Visit: Payer: Self-pay

## 2021-05-02 VITALS — BP 142/100 | HR 72 | Ht 66.5 in | Wt 253.0 lb

## 2021-05-02 DIAGNOSIS — R030 Elevated blood-pressure reading, without diagnosis of hypertension: Secondary | ICD-10-CM | POA: Insufficient documentation

## 2021-05-02 DIAGNOSIS — G43909 Migraine, unspecified, not intractable, without status migrainosus: Secondary | ICD-10-CM | POA: Diagnosis not present

## 2021-05-02 DIAGNOSIS — E039 Hypothyroidism, unspecified: Secondary | ICD-10-CM | POA: Insufficient documentation

## 2021-05-02 NOTE — Assessment & Plan Note (Signed)
Long discussion with patient regarding hypothyroidism, meaning of various labs including TSH and free T4 Discussed recommendations for treatment of hypothyroidism and potential risks associated with inadequate treatment Recommend checking labs at this time

## 2021-05-02 NOTE — Assessment & Plan Note (Signed)
Recommend lifestyle modifications, working towards healthy weight loss Consider referral to nutritionist

## 2021-05-02 NOTE — Patient Instructions (Signed)
  Medication Instructions:  Your physician recommends that you continue on your current medications as directed. Please refer to the Current Medication list given to you today. --If you need a refill on any your medications before your next appointment, please call your pharmacy first. If no refills are authorized on file call the office.--  Lab Work: Your physician has recommended that you have lab work today: CBC, CMP, A1C, TSH, and Lipid Profile If you have labs (blood work) drawn today and your tests are completely normal, you will receive your results only by: Marland Kitchen MyChart Message (if you have MyChart) OR . A phone call from our staff. Please ensure you check your voicemail in the event that you authorized detailed messages to be left on a delegated number. If you have any lab test that is abnormal or we need to change your treatment, we will call you to review the results.  Follow-Up: Your next appointment:   Your physician recommends that you schedule a follow-up appointment in: 36 WEEKS with Dr. de [place]am  Thanks for letting us be apart of your health journey!!  Primary Care and Sports Medicine   Dr. de [place] and Worthy Keeler, DNP, AGNP  We recommend signing up for the patient portal called "MyChart".  Sign up information is provided on this After Visit Summary.  MyChart is used to connect with patients for Virtual Visits (Telemedicine).  Patients are able to view lab/test results, encounter notes, upcoming appointments, etc.  Non-urgent messages can be sent to your provider as well.   To learn more about what you can do with MyChart, please visit --  [email or web address].

## 2021-05-02 NOTE — Assessment & Plan Note (Signed)
Last follow-up with neurology was about 5 months ago Since that time, patient has presented to the emergency department due to her headaches, indicating that migraines may not be adequately controlled Discussed with her my recommendation that she should follow-up with neurology for continued evaluation and discussion of treatment options to better control her symptoms In addition, given that she has been working with a neurologist regarding her headaches, I feel that paperwork regarding any work restrictions or accommodations would be more appropriately handled by her neurologist.  Recommend that she reach out to their office and drop off the paperwork for review and completion

## 2021-05-02 NOTE — Assessment & Plan Note (Signed)
Blood pressure elevated in office today Patient without formal diagnosis of hypertension Continue to monitor in the office, also advised on monitoring at home or at work She does check her blood pressure when she is symptomatic, particular with headaches but discussed with her that headaches could also be a symptom of high blood pressure as opposed to a source of acute increases in blood pressure.  Patient indicates that she is not aware of this.  Advised that she check blood pressure at times when she is not symptomatic for a more complete picture. Encourage lifestyle modifications, gradual weight loss as above Recommend adherence to DASH diet
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
## 2021-05-02 NOTE — Telephone Encounter (Signed)
Pt dropped of [employer] and paid $50 fee - 5.4.22

## 2021-05-03 ENCOUNTER — Other Ambulatory Visit (HOSPITAL_BASED_OUTPATIENT_CLINIC_OR_DEPARTMENT_OTHER)
Admission: RE | Admit: 2021-05-03 | Discharge: 2021-05-03 | Disposition: A | Discharge status: Home / Self Care | Payer: No Typology Code available for payment source | Source: Ambulatory Visit | Attending: Family Medicine | Admitting: Family Medicine

## 2021-05-03 ENCOUNTER — Ambulatory Visit (HOSPITAL_BASED_OUTPATIENT_CLINIC_OR_DEPARTMENT_OTHER): Payer: No Typology Code available for payment source | Admitting: Nurse Practitioner

## 2021-05-03 DIAGNOSIS — E039 Hypothyroidism, unspecified: Secondary | ICD-10-CM | POA: Insufficient documentation

## 2021-05-03 DIAGNOSIS — R030 Elevated blood-pressure reading, without diagnosis of hypertension: Secondary | ICD-10-CM | POA: Diagnosis not present

## 2021-05-03 DIAGNOSIS — G43909 Migraine, unspecified, not intractable, without status migrainosus: Secondary | ICD-10-CM | POA: Insufficient documentation

## 2021-05-03 DIAGNOSIS — Z0289 Encounter for other administrative examinations: Secondary | ICD-10-CM

## 2021-05-03 LAB — COMPREHENSIVE METABOLIC PANEL
ALT: 20 U/L (ref 0–44)
AST: 19 U/L (ref 15–41)
Albumin: 4.1 g/dL (ref 3.5–5.0)
Alkaline Phosphatase: 61 U/L (ref 38–126)
Anion gap: 9 (ref 5–15)
BUN: 8 mg/dL (ref 6–20)
CO2: 25 mmol/L (ref 22–32)
Calcium: 9 mg/dL (ref 8.9–10.3)
Chloride: 103 mmol/L (ref 98–111)
Creatinine, Ser: 1.03 mg/dL — ABNORMAL HIGH (ref 0.44–1.00)
GFR, Estimated: >60 mL/min (ref >60)
Glucose, Bld: 93 mg/dL (ref 70–99)
Potassium: 3.7 mmol/L (ref 3.5–5.1)
Sodium: 137 mmol/L (ref 135–145)
Total Bilirubin: 0.4 mg/dL (ref 0.3–1.2)
Total Protein: 6.9 g/dL (ref 6.5–8.1)

## 2021-05-03 LAB — HEMOGLOBIN A1C
Hgb A1c MFr Bld: 5.9 % — ABNORMAL HIGH (ref 4.8–5.6)
Mean Plasma Glucose: 122.63 mg/dL

## 2021-05-03 LAB — CBC WITH DIFFERENTIAL/PLATELET
Abs Immature Granulocytes: 0.01 10*3/uL (ref 0.00–0.07)
Basophils Absolute: 0 10*3/uL (ref 0.0–0.1)
Basophils Relative: 0 %
Eosinophils Absolute: 0.1 10*3/uL (ref 0.0–0.5)
Eosinophils Relative: 1 %
HCT: 40.9 % (ref 36.0–46.0)
Hemoglobin: 13.5 g/dL (ref 12.0–15.0)
Immature Granulocytes: 0 %
Lymphocytes Relative: 33 %
Lymphs Abs: 2.4 10*3/uL (ref 0.7–4.0)
MCH: 30.5 pg (ref 26.0–34.0)
MCHC: 33 g/dL (ref 30.0–36.0)
MCV: 92.3 fL (ref 80.0–100.0)
Monocytes Absolute: 0.8 10*3/uL (ref 0.1–1.0)
Monocytes Relative: 11 %
Neutro Abs: 4 10*3/uL (ref 1.7–7.7)
Neutrophils Relative %: 55 %
Platelets: 275 10*3/uL (ref 150–400)
RBC: 4.43 MIL/uL (ref 3.87–5.11)
RDW: 13 % (ref 11.5–15.5)
WBC: 7.2 10*3/uL (ref 4.0–10.5)
nRBC: 0 % (ref 0.0–0.2)

## 2021-05-03 LAB — LIPID PANEL
Cholesterol: 197 mg/dL (ref 0–200)
HDL: 41 mg/dL (ref >40)
LDL Cholesterol: 131 mg/dL — ABNORMAL HIGH (ref 0–99)
Total CHOL/HDL Ratio: 4.8 RATIO
Triglycerides: 123 mg/dL (ref <150)
VLDL: 25 mg/dL (ref 0–40)

## 2021-05-03 LAB — TSH: TSH: 37.012 u[IU]/mL — ABNORMAL HIGH (ref 0.350–4.500)

## 2021-05-03 NOTE — Telephone Encounter (Addendum)
I have asked [employer] for update on autopap start. Will fwd forms back to medical records to help with the reimbursement for payment.  I have also sent a mychart message to the pt about this.

## 2021-05-03 NOTE — Telephone Encounter (Signed)
Please see my office note from December 2021 for reference as well as patient instructions provided at the time of her visit.  Based on her history and exam and evaluation in December 2021 I cannot justify FMLA for her migraines.  We are pursuing treatment for her sleep apnea with AutoPap therapy.  Please advise patient that we will return her paperwork to her and her deposit for the paperwork can be returned to her as well.  Please see if you can get an update on her set up with AutoPap therapy, she will need to follow-up accordingly in the office for sleep apnea recheck and compliance check at the time.

## 2021-05-03 NOTE — Telephone Encounter (Signed)
FMLA paper work was left at the front for Dr. Rexene Alberts to review and fill out.  I have placed forms in office. This would be the first time we have filled these papers out and last visit was in 12/13/20 no pending f/u has been made.

## 2021-05-07 ENCOUNTER — Telehealth (HOSPITAL_BASED_OUTPATIENT_CLINIC_OR_DEPARTMENT_OTHER): Payer: Self-pay | Admitting: Family Medicine

## 2021-05-07 NOTE — Telephone Encounter (Signed)
Pt came in on 05/07/21 stating that the neurology doctor would not fill out her ADA paperwork they stated that her PCP Dr. de [place] should fill it out. Patient was made aware that her provider is not in the office today (05/07/21). Patient would like a call regarding this when provider is back in the office. Please advise.

## 2021-05-07 NOTE — Telephone Encounter (Signed)
Spoke with patient regarding ADA paperwork she is requesting Informed patient that we would not be able to authorize the paperwork if neurology is unwilling to authorize Patient does not have enough documented occurences under the care of Dr. Rexene Alberts or Dr. De [place] to sign off on ADA paperwork at this time. Patient states "my primary care doctor is not in the [facility] system and to see her I will have to pay out of pocket. I will just do that and have Dr. Jasmine December fill out the paperwork because she's been my primary for the last 10 years and I know she can do it" Patient is aware and agreeable to knowing that Dr. de [place] nor Dr. Rexene Alberts will authorize ADA paperwork and she will have Dr. Jasmine December sign paperwork

## 2021-05-08 ENCOUNTER — Telehealth: Payer: Self-pay

## 2021-05-08 ENCOUNTER — Other Ambulatory Visit (HOSPITAL_BASED_OUTPATIENT_CLINIC_OR_DEPARTMENT_OTHER): Payer: Self-pay

## 2021-05-08 NOTE — Telephone Encounter (Signed)
Received this message from [facility].  Jake Bathe, RN Not yet scheduled - [facility] 8.7, due for a f/u call re: scheduling delays soon. Waiting on inventory.   Janett Billow

## 2021-05-09 ENCOUNTER — Other Ambulatory Visit (HOSPITAL_BASED_OUTPATIENT_CLINIC_OR_DEPARTMENT_OTHER): Payer: Self-pay

## 2021-05-14 ENCOUNTER — Other Ambulatory Visit (HOSPITAL_BASED_OUTPATIENT_CLINIC_OR_DEPARTMENT_OTHER): Payer: Self-pay

## 2021-05-16 ENCOUNTER — Telehealth (HOSPITAL_BASED_OUTPATIENT_CLINIC_OR_DEPARTMENT_OTHER): Payer: Self-pay

## 2021-05-16 NOTE — Telephone Encounter (Signed)
Results released by Dr. de [place] and reviewed by patient via MyChart Instructed patient to contact the office with any questions or concerns.  

## 2021-05-16 NOTE — Telephone Encounter (Signed)
-----   Message from Raymond J de [place], MD sent at 05/10/2021 11:18 AM EDT ----- Dema Severin blood cell and red blood cell counts are normal with normal hemoglobin.  Hemoglobin A1c is still within the "prediabetes" range at 5.9%.  Electrolytes are normal with slight elevation in creatinine which is a marker of kidney function, thus there is a slight decrease in kidney function.  Total cholesterol is normal with elevated "bad" cholesterol.  TSH has further increased from prior reading and is now at 37.  Given that levothyroxine has not been regularly taken, it is understandable that the TSH has continued to increase from prior reading.  It is highly recommended that you resume taking the levothyroxine as prescribed in order to ensure normal levels of circulating thyroid hormone.  Untreated hypothyroidism can lead to fatigue, weakness, cold intolerance, weight gain, cognitive dysfunction, muscle pain, tingling, depression, joint pains.

## 2021-05-17 ENCOUNTER — Other Ambulatory Visit (HOSPITAL_BASED_OUTPATIENT_CLINIC_OR_DEPARTMENT_OTHER): Payer: Self-pay

## 2021-05-17 MED ORDER — NARATRIPTAN HCL 2.5 MG PO TABS
ORAL_TABLET | ORAL | 11 refills | Status: DC
  Filled 2021-05-17: qty 30, 15d supply, fill #0

## 2021-05-18 ENCOUNTER — Other Ambulatory Visit (HOSPITAL_BASED_OUTPATIENT_CLINIC_OR_DEPARTMENT_OTHER): Payer: Self-pay

## 2021-05-22 ENCOUNTER — Emergency Department (HOSPITAL_BASED_OUTPATIENT_CLINIC_OR_DEPARTMENT_OTHER)
Admission: EM | Admit: 2021-05-22 | Discharge: 2021-05-22 | Disposition: A | Discharge status: Home / Self Care | Payer: No Typology Code available for payment source | Attending: Emergency Medicine | Admitting: Emergency Medicine

## 2021-05-22 ENCOUNTER — Other Ambulatory Visit: Payer: Self-pay

## 2021-05-22 ENCOUNTER — Other Ambulatory Visit (HOSPITAL_BASED_OUTPATIENT_CLINIC_OR_DEPARTMENT_OTHER): Payer: Self-pay

## 2021-05-22 ENCOUNTER — Encounter (HOSPITAL_BASED_OUTPATIENT_CLINIC_OR_DEPARTMENT_OTHER): Payer: Self-pay | Admitting: *Deleted

## 2021-05-22 DIAGNOSIS — I10 Essential (primary) hypertension: Secondary | ICD-10-CM | POA: Insufficient documentation

## 2021-05-22 DIAGNOSIS — E039 Hypothyroidism, unspecified: Secondary | ICD-10-CM | POA: Insufficient documentation

## 2021-05-22 DIAGNOSIS — R519 Headache, unspecified: Secondary | ICD-10-CM | POA: Diagnosis not present

## 2021-05-22 DIAGNOSIS — J45909 Unspecified asthma, uncomplicated: Secondary | ICD-10-CM | POA: Diagnosis not present

## 2021-05-22 DIAGNOSIS — Z87891 Personal history of nicotine dependence: Secondary | ICD-10-CM | POA: Diagnosis not present

## 2021-05-22 DIAGNOSIS — H53149 Visual discomfort, unspecified: Secondary | ICD-10-CM | POA: Diagnosis not present

## 2021-05-22 DIAGNOSIS — R110 Nausea: Secondary | ICD-10-CM | POA: Insufficient documentation

## 2021-05-22 DIAGNOSIS — Z79899 Other long term (current) drug therapy: Secondary | ICD-10-CM | POA: Diagnosis not present

## 2021-05-22 LAB — CBC WITH DIFFERENTIAL/PLATELET
Abs Immature Granulocytes: 0.01 10*3/uL (ref 0.00–0.07)
Basophils Absolute: 0 10*3/uL (ref 0.0–0.1)
Basophils Relative: 1 %
Eosinophils Absolute: 0.1 10*3/uL (ref 0.0–0.5)
Eosinophils Relative: 1 %
HCT: 41.1 % (ref 36.0–46.0)
Hemoglobin: 13.5 g/dL (ref 12.0–15.0)
Immature Granulocytes: 0 %
Lymphocytes Relative: 30 %
Lymphs Abs: 2 10*3/uL (ref 0.7–4.0)
MCH: 30 pg (ref 26.0–34.0)
MCHC: 32.8 g/dL (ref 30.0–36.0)
MCV: 91.3 fL (ref 80.0–100.0)
Monocytes Absolute: 0.6 10*3/uL (ref 0.1–1.0)
Monocytes Relative: 9 %
Neutro Abs: 3.9 10*3/uL (ref 1.7–7.7)
Neutrophils Relative %: 59 %
Platelets: 244 10*3/uL (ref 150–400)
RBC: 4.5 MIL/uL (ref 3.87–5.11)
RDW: 12.6 % (ref 11.5–15.5)
WBC: 6.6 10*3/uL (ref 4.0–10.5)
nRBC: 0 % (ref 0.0–0.2)

## 2021-05-22 LAB — BASIC METABOLIC PANEL
Anion gap: 9 (ref 5–15)
BUN: 16 mg/dL (ref 6–20)
CO2: 25 mmol/L (ref 22–32)
Calcium: 8.4 mg/dL — ABNORMAL LOW (ref 8.9–10.3)
Chloride: 104 mmol/L (ref 98–111)
Creatinine, Ser: 0.88 mg/dL (ref 0.44–1.00)
GFR, Estimated: >60 mL/min (ref >60)
Glucose, Bld: 101 mg/dL — ABNORMAL HIGH (ref 70–99)
Potassium: 3.5 mmol/L (ref 3.5–5.1)
Sodium: 138 mmol/L (ref 135–145)

## 2021-05-22 LAB — PREGNANCY, URINE: Preg Test, Ur: NEGATIVE

## 2021-05-22 MED ORDER — METOCLOPRAMIDE HCL 5 MG/ML IJ SOLN
10.0000 mg | Freq: Once | INTRAMUSCULAR | Status: AC
  Administered 2021-05-22: 10 mg via INTRAVENOUS
  Filled 2021-05-22: qty 2

## 2021-05-22 MED ORDER — DIPHENHYDRAMINE HCL 50 MG/ML IJ SOLN
25.0000 mg | Freq: Once | INTRAMUSCULAR | Status: AC
  Administered 2021-05-22: 25 mg via INTRAVENOUS
  Filled 2021-05-22: qty 1

## 2021-05-22 MED ORDER — KETOROLAC TROMETHAMINE 30 MG/ML IJ SOLN
30.0000 mg | Freq: Once | INTRAMUSCULAR | Status: AC
  Administered 2021-05-22: 30 mg via INTRAVENOUS
  Filled 2021-05-22: qty 1

## 2021-05-22 MED ORDER — DEXAMETHASONE SODIUM PHOSPHATE 10 MG/ML IJ SOLN
10.0000 mg | Freq: Once | INTRAMUSCULAR | Status: AC
  Administered 2021-05-22: 10 mg via INTRAVENOUS
  Filled 2021-05-22: qty 1

## 2021-05-22 MED ORDER — SODIUM CHLORIDE 0.9 % IV BOLUS
500.0000 mL | Freq: Once | INTRAVENOUS | Status: AC
Start: 2021-05-22 — End: 2021-05-22
  Administered 2021-05-22: 500 mL via INTRAVENOUS
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
## 2021-05-22 NOTE — ED Triage Notes (Signed)
Migraine headache started 13 days ago.

## 2021-05-22 NOTE — ED Notes (Signed)
Patient verbalizes understanding of discharge instructions. Opportunity for questioning and answers were provided. Armband removed by staff, pt discharged from ED.  

## 2021-05-22 NOTE — Discharge Instructions (Addendum)
You have been seen and discharged from the emergency department.  Establish care with a new neurologist to be reevaluated for your migraine headaches and frequency.  They may also do more specialized imaging if needed.  Follow-up with your primary provider for reevaluation and further care. Take home medications as prescribed. If you have any worsening symptoms or further concerns for your health please return to an emergency department for further evaluation.

## 2021-05-23 ENCOUNTER — Other Ambulatory Visit (HOSPITAL_BASED_OUTPATIENT_CLINIC_OR_DEPARTMENT_OTHER): Payer: Self-pay

## 2021-05-29 ENCOUNTER — Other Ambulatory Visit (HOSPITAL_BASED_OUTPATIENT_CLINIC_OR_DEPARTMENT_OTHER): Payer: Self-pay

## 2021-05-29 MED ORDER — MELOXICAM 15 MG PO TABS
1.0000 | ORAL_TABLET | Freq: Every day | ORAL | 0 refills | Status: DC
  Filled 2021-05-29: qty 30, 30d supply, fill #0

## 2021-05-30 ENCOUNTER — Other Ambulatory Visit (HOSPITAL_BASED_OUTPATIENT_CLINIC_OR_DEPARTMENT_OTHER): Payer: Self-pay

## 2021-05-31 ENCOUNTER — Other Ambulatory Visit (HOSPITAL_BASED_OUTPATIENT_CLINIC_OR_DEPARTMENT_OTHER): Payer: Self-pay

## 2021-05-31 ENCOUNTER — Ambulatory Visit (HOSPITAL_COMMUNITY)
Admission: EM | Admit: 2021-05-31 | Discharge: 2021-05-31 | Disposition: A | Discharge status: Home / Self Care | Payer: No Typology Code available for payment source

## 2021-05-31 ENCOUNTER — Other Ambulatory Visit: Payer: Self-pay

## 2021-05-31 ENCOUNTER — Encounter (HOSPITAL_COMMUNITY): Payer: Self-pay | Admitting: Emergency Medicine

## 2021-05-31 DIAGNOSIS — R197 Diarrhea, unspecified: Secondary | ICD-10-CM | POA: Diagnosis not present

## 2021-05-31 DIAGNOSIS — K2900 Acute gastritis without bleeding: Secondary | ICD-10-CM

## 2021-05-31 DIAGNOSIS — R110 Nausea: Secondary | ICD-10-CM

## 2021-05-31 MED ORDER — ONDANSETRON 4 MG PO TBDP
4.0000 mg | ORAL_TABLET | Freq: Three times a day (TID) | ORAL | 0 refills | Status: DC | PRN
  Filled 2021-05-31: qty 20, 7d supply, fill #0

## 2021-05-31 MED ORDER — DICYCLOMINE HCL 20 MG PO TABS
20.0000 mg | ORAL_TABLET | Freq: Two times a day (BID) | ORAL | 0 refills | Status: DC
  Filled 2021-05-31: qty 20, 10d supply, fill #0

## 2021-05-31 NOTE — ED Triage Notes (Addendum)
Sweating, diarrhea, nausea started on Monday evening.  Reports 4-5 episodes of diarrhea today, less episodes today than yesterday

## 2021-05-31 NOTE — ED Notes (Signed)
Verified work note with Counsellor, np

## 2021-05-31 NOTE — Discharge Instructions (Signed)
Follow up with pcp  No heavy foods for 24 hours  Take meds as needed
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
## 2021-06-04 ENCOUNTER — Other Ambulatory Visit (HOSPITAL_BASED_OUTPATIENT_CLINIC_OR_DEPARTMENT_OTHER): Payer: Self-pay

## 2021-06-05 ENCOUNTER — Other Ambulatory Visit (HOSPITAL_BASED_OUTPATIENT_CLINIC_OR_DEPARTMENT_OTHER): Payer: Self-pay

## 2021-06-05 ENCOUNTER — Ambulatory Visit: Payer: Self-pay | Admitting: Adult Health

## 2021-06-11 ENCOUNTER — Telehealth (HOSPITAL_BASED_OUTPATIENT_CLINIC_OR_DEPARTMENT_OTHER): Payer: Self-pay | Admitting: Family Medicine

## 2021-06-11 NOTE — Telephone Encounter (Signed)
Called pt on 6/13 regarding upcoming appt that was on 6/15 with dr. Tennis Must [place]. Pt stated she would like to cancel the appt due to her going back to her old PCP. Please advise.

## 2021-06-13 ENCOUNTER — Ambulatory Visit (HOSPITAL_BASED_OUTPATIENT_CLINIC_OR_DEPARTMENT_OTHER): Payer: No Typology Code available for payment source | Admitting: Family Medicine

## 2021-07-04 ENCOUNTER — Other Ambulatory Visit (HOSPITAL_BASED_OUTPATIENT_CLINIC_OR_DEPARTMENT_OTHER): Payer: Self-pay

## 2021-07-13 ENCOUNTER — Encounter (HOSPITAL_COMMUNITY): Payer: Self-pay | Admitting: Emergency Medicine

## 2021-07-13 ENCOUNTER — Emergency Department (HOSPITAL_COMMUNITY): Payer: Medicaid Other

## 2021-07-13 ENCOUNTER — Other Ambulatory Visit: Payer: Self-pay

## 2021-07-13 ENCOUNTER — Emergency Department (HOSPITAL_COMMUNITY)
Admission: EM | Admit: 2021-07-13 | Discharge: 2021-07-13 | Disposition: A | Discharge status: Home / Self Care | Payer: Medicaid Other | Attending: Emergency Medicine | Admitting: Emergency Medicine

## 2021-07-13 DIAGNOSIS — Z79899 Other long term (current) drug therapy: Secondary | ICD-10-CM | POA: Insufficient documentation

## 2021-07-13 DIAGNOSIS — J45909 Unspecified asthma, uncomplicated: Secondary | ICD-10-CM | POA: Diagnosis not present

## 2021-07-13 DIAGNOSIS — R202 Paresthesia of skin: Secondary | ICD-10-CM | POA: Diagnosis not present

## 2021-07-13 DIAGNOSIS — Z7982 Long term (current) use of aspirin: Secondary | ICD-10-CM | POA: Diagnosis not present

## 2021-07-13 DIAGNOSIS — E039 Hypothyroidism, unspecified: Secondary | ICD-10-CM | POA: Insufficient documentation

## 2021-07-13 DIAGNOSIS — Z87891 Personal history of nicotine dependence: Secondary | ICD-10-CM | POA: Diagnosis not present

## 2021-07-13 DIAGNOSIS — M5442 Lumbago with sciatica, left side: Secondary | ICD-10-CM | POA: Insufficient documentation

## 2021-07-13 DIAGNOSIS — R200 Anesthesia of skin: Secondary | ICD-10-CM | POA: Diagnosis not present

## 2021-07-13 DIAGNOSIS — M25559 Pain in unspecified hip: Secondary | ICD-10-CM

## 2021-07-13 LAB — CBC WITH DIFFERENTIAL/PLATELET
Abs Immature Granulocytes: 0.01 10*3/uL (ref 0.00–0.07)
Basophils Absolute: 0 10*3/uL (ref 0.0–0.1)
Basophils Relative: 0 %
Eosinophils Absolute: 0.1 10*3/uL (ref 0.0–0.5)
Eosinophils Relative: 1 %
HCT: 42.6 % (ref 36.0–46.0)
Hemoglobin: 13.9 g/dL (ref 12.0–15.0)
Immature Granulocytes: 0 %
Lymphocytes Relative: 39 %
Lymphs Abs: 2.6 10*3/uL (ref 0.7–4.0)
MCH: 30 pg (ref 26.0–34.0)
MCHC: 32.6 g/dL (ref 30.0–36.0)
MCV: 91.8 fL (ref 80.0–100.0)
Monocytes Absolute: 0.7 10*3/uL (ref 0.1–1.0)
Monocytes Relative: 11 %
Neutro Abs: 3.2 10*3/uL (ref 1.7–7.7)
Neutrophils Relative %: 49 %
Platelets: 251 10*3/uL (ref 150–400)
RBC: 4.64 MIL/uL (ref 3.87–5.11)
RDW: 12.7 % (ref 11.5–15.5)
WBC: 6.6 10*3/uL (ref 4.0–10.5)
nRBC: 0 % (ref 0.0–0.2)

## 2021-07-13 LAB — BASIC METABOLIC PANEL
Anion gap: 9 (ref 5–15)
BUN: 11 mg/dL (ref 6–20)
CO2: 26 mmol/L (ref 22–32)
Calcium: 8.7 mg/dL — ABNORMAL LOW (ref 8.9–10.3)
Chloride: 103 mmol/L (ref 98–111)
Creatinine, Ser: 0.97 mg/dL (ref 0.44–1.00)
GFR, Estimated: >60 mL/min (ref >60)
Glucose, Bld: 95 mg/dL (ref 70–99)
Potassium: 3.3 mmol/L — ABNORMAL LOW (ref 3.5–5.1)
Sodium: 138 mmol/L (ref 135–145)

## 2021-07-13 MED ORDER — MIDAZOLAM HCL 2 MG/2ML IJ SOLN
2.0000 mg | Freq: Once | INTRAMUSCULAR | Status: AC
  Administered 2021-07-13: 2 mg via INTRAVENOUS
  Filled 2021-07-13: qty 2

## 2021-07-13 MED ORDER — OXYCODONE HCL 5 MG PO TABS: 5.0000 mg | ORAL_TABLET | Freq: Four times a day (QID) | ORAL | 0 refills | Status: DC | PRN

## 2021-07-13 MED ORDER — GADOBUTROL 1 MMOL/ML IV SOLN
10.0000 mL | Freq: Once | INTRAVENOUS | Status: AC | PRN
  Administered 2021-07-13: 10 mL via INTRAVENOUS

## 2021-07-13 MED ORDER — OXYCODONE HCL 5 MG PO TABS
5.0000 mg | ORAL_TABLET | Freq: Once | ORAL | Status: AC
Start: 2021-07-13 — End: 2021-07-13
  Administered 2021-07-13: 5 mg via ORAL
  Filled 2021-07-13: qty 1

## 2021-07-13 MED ORDER — LORAZEPAM 1 MG PO TABS: 0.5000 mg | ORAL_TABLET | Freq: Once | ORAL | Status: DC

## 2021-07-13 NOTE — ED Triage Notes (Signed)
Pt reports left sided hip pain that shoots down her left leg that started yesterday. Pt reports pain with bearing weight, and difficulty with ambulation, leg keeps giving out. Pt denies any urinary sx.

## 2021-07-13 NOTE — ED Notes (Signed)
Patient transported to MRI 

## 2021-07-13 NOTE — Discharge Instructions (Addendum)
Call your primary care doctor or specialist as discussed in the next 2-3 days.   Return immediately back to the ER if:  Your symptoms worsen within the next 12-24 hours. You develop new symptoms such as new fevers, persistent vomiting, new pain, shortness of breath, or new weakness or numbness, or if you have any other concerns.  
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
## 2021-07-13 NOTE — ED Provider Notes (Signed)
Emergency Medicine Provider Triage Evaluation Note  Victoria Collier , a 50 y.o. female  was evaluated in triage.  Pt complains of left lumbar back pain and left hip pain.  Pain began yesterday and has gotten progressively worse since then.  Patient reports that pain shoots into her entire left leg.  Patient has increased pain with weightbearing, movement, and touch.  Patient reports difficulty ambulating stating her "leg keeps giving out."  Patient denies any fevers, chills, saddle anesthesia, bowel or bladder dysfunction, neck pain.  Patient denies any recent falls or injuries.  Patient denies any history of IV drug use or cancer.  Review of Systems  Positive: Arthralgia, myalgia Negative: fevers, chills, saddle anesthesia, bowel or bladder dysfunction, neck pain  Physical Exam  BP (!) 129/96   Pulse 77   Temp 97.9 F (36.6 C) (Oral)   Resp 14   Ht 5\' 5"  (1.651 m)   Wt 112.5 kg   SpO2 95%   BMI 41.27 kg/m  Gen:   Awake, no distress   Resp:  Normal effort  MSK:   Moves extremities without difficulty  Other:  Tenderness to left lumbar back and left hip.  +2 left DP pulse, sensation intact to all digits of foot.  Medical Decision Making  Medically screening exam initiated at 12:16 PM.  Appropriate orders placed.  Victoria Collier was informed that the remainder of the evaluation will be completed by another provider, this initial triage assessment does not replace that evaluation, and the importance of remaining in the ED until their evaluation is complete.  The patient appears stable so that the remainder of the work up may be completed by another provider.      Loni Beckwith, PA-C 07/13/21 [place], [place], DO 07/14/21 1513

## 2021-07-13 NOTE — ED Provider Notes (Signed)
Herniated disc.  No evidence of cauda equina.  Patient discharged home to follow-up with primary care doctor within the week.  Advised return for fevers worsening pain falls or any additional concerns.   Luna Fuse, MD 07/13/21 2038

## 2021-07-18 ENCOUNTER — Telehealth: Payer: Self-pay | Admitting: *Deleted

## 2021-07-18 NOTE — Telephone Encounter (Signed)
Received fax that pt set up for ibreeze 07-16-21 (placed info in care coordination).  Sent message to [employer] to tag.

## 2021-07-19 NOTE — Telephone Encounter (Signed)
I called and made appt for pt 09-19-21 at 0930 for initial cpap f/u.  Pt verbalized understanding.

## 2021-07-19 NOTE — Telephone Encounter (Signed)
RE: tag set up  ibreeze to Dr. Rexene Alberts Received: Today Denyse Amass, RN I called her to let her know that she will need to come to our office for a download before her appointment. She said that she will do that.       Previous Messages    ----- Message -----  From: Brandon Melnick, RN  Sent: 07/18/2021   4:43 PM EDT  To: Vanessa Ralphs, Marchelle Gearing  Subject: tag set up  ibreeze to Dr. Rexene Alberts               HI I received fax that pt set up for Ibreeze 07-16-21 and needs to be tagged to Dr. Rexene Alberts please.  Thank you.     Miela Desjardin   Female, 49 y.o., [birth year]   MRN:  [id number]    [place]

## 2021-07-25 ENCOUNTER — Emergency Department (HOSPITAL_COMMUNITY)
Admission: EM | Admit: 2021-07-25 | Discharge: 2021-07-25 | Disposition: A | Discharge status: Home / Self Care | Payer: Medicaid Other | Attending: Emergency Medicine | Admitting: Emergency Medicine

## 2021-07-25 ENCOUNTER — Encounter (HOSPITAL_COMMUNITY): Payer: Self-pay | Admitting: Emergency Medicine

## 2021-07-25 DIAGNOSIS — E039 Hypothyroidism, unspecified: Secondary | ICD-10-CM | POA: Insufficient documentation

## 2021-07-25 DIAGNOSIS — J45909 Unspecified asthma, uncomplicated: Secondary | ICD-10-CM | POA: Insufficient documentation

## 2021-07-25 DIAGNOSIS — Z87891 Personal history of nicotine dependence: Secondary | ICD-10-CM | POA: Insufficient documentation

## 2021-07-25 DIAGNOSIS — Z79899 Other long term (current) drug therapy: Secondary | ICD-10-CM | POA: Insufficient documentation

## 2021-07-25 DIAGNOSIS — M545 Low back pain, unspecified: Secondary | ICD-10-CM | POA: Diagnosis present

## 2021-07-25 DIAGNOSIS — M5442 Lumbago with sciatica, left side: Secondary | ICD-10-CM | POA: Diagnosis not present

## 2021-07-25 MED ORDER — OXYCODONE HCL 5 MG PO TABS: 5.0000 mg | ORAL_TABLET | Freq: Four times a day (QID) | ORAL | 0 refills | Status: DC | PRN

## 2021-07-25 MED ORDER — MORPHINE SULFATE (PF) 4 MG/ML IV SOLN
4.0000 mg | Freq: Once | INTRAVENOUS | Status: AC
  Administered 2021-07-25: 4 mg via INTRAVENOUS
  Filled 2021-07-25: qty 1

## 2021-07-25 MED ORDER — METHOCARBAMOL 500 MG PO TABS
500.0000 mg | ORAL_TABLET | Freq: Once | ORAL | Status: AC
  Administered 2021-07-25: 500 mg via ORAL
  Filled 2021-07-25: qty 1

## 2021-07-25 MED ORDER — LIDOCAINE 5 % EX PTCH
1.0000 | MEDICATED_PATCH | CUTANEOUS | Status: DC
  Administered 2021-07-25: 1 via TRANSDERMAL
  Filled 2021-07-25: qty 1

## 2021-07-25 NOTE — ED Notes (Signed)
Pt walks with steady slow gait. Provider made aware

## 2021-07-25 NOTE — ED Provider Notes (Addendum)
Emergency Medicine Provider Triage Evaluation Note  Victoria Collier , a 50 y.o. female  was evaluated in triage.  Pt complains of back pain that radiates down her leg.  She was seen last week in the emergency department, given pain medicine and muscle relaxers which symptoms mitigated the pain but have not improved.  No previous spinal surgeries, no IV drug use.  No recent trauma or accidents.  Review of Systems  Positive: Back pain Negative: Trauma, dysuria, urinary retention, saddle anesthesia  Physical Exam  BP (!) 137/95   Pulse 76   Temp 97.8 F (36.6 C) (Oral)   Resp 20   Ht '5\' 5"'$  (1.651 m)   Wt 113 kg   SpO2 100%   BMI 41.46 kg/m  Gen:   Awake, no distress   Resp:  Normal effort  MSK:   Moves extremities without difficulty  Other:  Cranial nerves II through XII are grossly intact  Medical Decision Making  Medically screening exam initiated at 9:42 AM.  Appropriate orders placed.  Feather Eustaquio was informed that the remainder of the evaluation will be completed by another provider, this initial triage assessment does not replace that evaluation, and the importance of remaining in the ED until their evaluation is complete.     Sherrill Raring, PA-C 07/25/21 [place]    Sherrill Raring, PA-C 07/25/21 1511    Elnora Morrison, MD 07/26/21 1739
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
## 2021-07-25 NOTE — ED Triage Notes (Signed)
Pt endorses back/leg pain and trouble walking due to pain. States she worked 8 hours yesterday. Pt had MRI done here 7/15.

## 2021-07-25 NOTE — Discharge Instructions (Addendum)
It was a pleasure taking care of you today. As discussed, your MRI on the 15th showed herniated discs. I have included the number of the neurosurgeon.  Please call tomorrow to schedule an appointment for further evaluation.  I am sending you home with symptomatic treatment.  Take as needed for severe pain.  I have also included low back exercises.  Perform daily.  You may also purchase over-the-counter Lidoderm patches and Voltaren gel for added pain relief.  Continue to take your muscle relaxer as needed.  Return to the ER for new or worsening symptoms.

## 2021-08-13 ENCOUNTER — Encounter (HOSPITAL_COMMUNITY): Payer: Self-pay

## 2021-08-13 ENCOUNTER — Ambulatory Visit (HOSPITAL_COMMUNITY)
Admission: EM | Admit: 2021-08-13 | Discharge: 2021-08-13 | Disposition: A | Discharge status: Home / Self Care | Payer: Medicaid Other | Attending: Internal Medicine | Admitting: Internal Medicine

## 2021-08-13 ENCOUNTER — Other Ambulatory Visit: Payer: Self-pay

## 2021-08-13 DIAGNOSIS — M5432 Sciatica, left side: Secondary | ICD-10-CM | POA: Diagnosis not present

## 2021-08-13 MED ORDER — KETOROLAC TROMETHAMINE 30 MG/ML IJ SOLN
30.0000 mg | Freq: Once | INTRAMUSCULAR | Status: AC
  Administered 2021-08-13: 30 mg via INTRAMUSCULAR

## 2021-08-13 MED ORDER — KETOROLAC TROMETHAMINE 30 MG/ML IJ SOLN
INTRAMUSCULAR | Status: AC
  Filled 2021-08-13: qty 1
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
## 2021-08-13 NOTE — ED Triage Notes (Signed)
Pt presents with sciatic pain on left side pain staring at buttocks and hips and radiating down leg radiating down to leg for about a month .

## 2021-08-13 NOTE — Discharge Instructions (Addendum)
Gentle range of motion exercises Keep your appointment with the neurosurgeon for Wednesday, 08/15/2021 Please call your neurosurgeon orthopedic office sooner if your pain worsens.

## 2021-08-15 ENCOUNTER — Ambulatory Visit: Payer: Medicaid Other | Admitting: Orthopaedic Surgery

## 2021-08-27 ENCOUNTER — Encounter (HOSPITAL_BASED_OUTPATIENT_CLINIC_OR_DEPARTMENT_OTHER): Payer: Self-pay | Admitting: Obstetrics and Gynecology

## 2021-08-27 ENCOUNTER — Emergency Department (HOSPITAL_BASED_OUTPATIENT_CLINIC_OR_DEPARTMENT_OTHER)
Admission: EM | Admit: 2021-08-27 | Discharge: 2021-08-27 | Disposition: A | Discharge status: Home / Self Care | Payer: Medicaid Other | Attending: Emergency Medicine | Admitting: Emergency Medicine

## 2021-08-27 ENCOUNTER — Other Ambulatory Visit: Payer: Self-pay

## 2021-08-27 DIAGNOSIS — Z79899 Other long term (current) drug therapy: Secondary | ICD-10-CM | POA: Insufficient documentation

## 2021-08-27 DIAGNOSIS — Z87891 Personal history of nicotine dependence: Secondary | ICD-10-CM | POA: Insufficient documentation

## 2021-08-27 DIAGNOSIS — M5442 Lumbago with sciatica, left side: Secondary | ICD-10-CM | POA: Diagnosis not present

## 2021-08-27 DIAGNOSIS — J45909 Unspecified asthma, uncomplicated: Secondary | ICD-10-CM | POA: Diagnosis not present

## 2021-08-27 DIAGNOSIS — M545 Low back pain, unspecified: Secondary | ICD-10-CM | POA: Diagnosis present

## 2021-08-27 DIAGNOSIS — E039 Hypothyroidism, unspecified: Secondary | ICD-10-CM | POA: Diagnosis not present

## 2021-08-27 MED ORDER — OXYCODONE HCL 5 MG PO TABS: 5.0000 mg | ORAL_TABLET | ORAL | 0 refills | Status: DC | PRN

## 2021-08-27 MED ORDER — KETOROLAC TROMETHAMINE 15 MG/ML IJ SOLN
15.0000 mg | Freq: Once | INTRAMUSCULAR | Status: AC
  Administered 2021-08-27: 15 mg via INTRAMUSCULAR
  Filled 2021-08-27: qty 1

## 2021-08-27 NOTE — Discharge Instructions (Addendum)
Follow-up with your PCP in 2 days as planned.

## 2021-08-27 NOTE — ED Notes (Signed)
Patient verbalizes understanding of discharge instructions. Opportunity for questioning and answers were provided. Patient discharged from ED.  °

## 2021-08-27 NOTE — ED Triage Notes (Signed)
Patient reports she has Sciatica on the left side that is causing debilitating pain. Patient reports she has trouble moving her legs by the end of a shift. Reports falls due to left leg giving out.
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
## 2021-09-19 ENCOUNTER — Ambulatory Visit: Payer: Self-pay | Admitting: Neurology

## 2021-09-30 ENCOUNTER — Ambulatory Visit (HOSPITAL_COMMUNITY)
Admission: EM | Admit: 2021-09-30 | Discharge: 2021-09-30 | Disposition: A | Discharge status: Home / Self Care | Payer: Medicaid Other | Attending: Emergency Medicine | Admitting: Emergency Medicine

## 2021-09-30 ENCOUNTER — Encounter (HOSPITAL_COMMUNITY): Payer: Self-pay | Admitting: Emergency Medicine

## 2021-09-30 ENCOUNTER — Other Ambulatory Visit: Payer: Self-pay

## 2021-09-30 DIAGNOSIS — M5432 Sciatica, left side: Secondary | ICD-10-CM

## 2021-09-30 MED ORDER — TIZANIDINE HCL 4 MG PO TABS: 4.0000 mg | ORAL_TABLET | Freq: Four times a day (QID) | ORAL | 0 refills | Status: DC | PRN

## 2021-09-30 MED ORDER — PREDNISONE 10 MG (21) PO TBPK: ORAL_TABLET | Freq: Every day | ORAL | 0 refills | Status: DC

## 2021-09-30 MED ORDER — KETOROLAC TROMETHAMINE 60 MG/2ML IM SOLN
60.0000 mg | Freq: Once | INTRAMUSCULAR | Status: AC
  Administered 2021-09-30: 60 mg via INTRAMUSCULAR

## 2021-09-30 MED ORDER — KETOROLAC TROMETHAMINE 60 MG/2ML IM SOLN
INTRAMUSCULAR | Status: AC
  Filled 2021-09-30: qty 2

## 2021-09-30 NOTE — ED Triage Notes (Signed)
Pt presents with left sie back pain that radiates into hip and leg xs 3 days.
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
## 2021-09-30 NOTE — Discharge Instructions (Signed)
Take the prednisone Dosepak as prescribed.  You can take tizanidine as needed for muscle pain and spasms.  Do not take this before driving as it can make you sleepy.  You can take Tylenol as needed for pain relief.  Follow-up with your primary care provider for reevaluation and further pain management techniques.

## 2021-10-03 ENCOUNTER — Emergency Department (HOSPITAL_COMMUNITY)
Admission: EM | Admit: 2021-10-03 | Discharge: 2021-10-03 | Disposition: A | Discharge status: Home / Self Care | Payer: Medicaid Other | Attending: Emergency Medicine | Admitting: Emergency Medicine

## 2021-10-03 ENCOUNTER — Encounter (HOSPITAL_COMMUNITY): Payer: Self-pay

## 2021-10-03 DIAGNOSIS — Z87891 Personal history of nicotine dependence: Secondary | ICD-10-CM | POA: Diagnosis not present

## 2021-10-03 DIAGNOSIS — E039 Hypothyroidism, unspecified: Secondary | ICD-10-CM | POA: Diagnosis not present

## 2021-10-03 DIAGNOSIS — E669 Obesity, unspecified: Secondary | ICD-10-CM | POA: Insufficient documentation

## 2021-10-03 DIAGNOSIS — J45909 Unspecified asthma, uncomplicated: Secondary | ICD-10-CM | POA: Diagnosis not present

## 2021-10-03 DIAGNOSIS — Z79899 Other long term (current) drug therapy: Secondary | ICD-10-CM | POA: Insufficient documentation

## 2021-10-03 DIAGNOSIS — M5441 Lumbago with sciatica, right side: Secondary | ICD-10-CM | POA: Insufficient documentation

## 2021-10-03 DIAGNOSIS — G8929 Other chronic pain: Secondary | ICD-10-CM | POA: Insufficient documentation

## 2021-10-03 DIAGNOSIS — M5442 Lumbago with sciatica, left side: Secondary | ICD-10-CM | POA: Insufficient documentation

## 2021-10-03 MED ORDER — KETAMINE HCL 50 MG/ML IJ SOLN
1.5000 mg/kg | Freq: Once | INTRAMUSCULAR | Status: AC
  Administered 2021-10-03: 175 mg via INTRAMUSCULAR
  Filled 2021-10-03: qty 1

## 2021-10-03 MED ORDER — KETOROLAC TROMETHAMINE 30 MG/ML IJ SOLN
30.0000 mg | Freq: Once | INTRAMUSCULAR | Status: AC
  Administered 2021-10-03: 30 mg via INTRAMUSCULAR
  Filled 2021-10-03: qty 1
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
## 2021-10-03 NOTE — ED Triage Notes (Signed)
Pt reports that her sciatica has been acting up for the past 5 days.

## 2021-10-03 NOTE — Discharge Instructions (Addendum)
Take the medicines that you already have.  Follow-up with your primary care doctor and your neurosurgeon for further management.

## 2021-10-03 NOTE — ED Notes (Signed)
Attempted to get pt's vital, pt refusing at this time, states "i'm hot, I need a fan"

## 2021-10-03 NOTE — ED Notes (Signed)
Pt given portable fan

## 2021-10-03 NOTE — ED Provider Notes (Signed)
Emergency Medicine Provider Triage Evaluation Note  Victoria Collier , a 50 y.o. female  was evaluated in triage.  Pt complains of left sided "sciatica".  Many visits with ortho, family practice, and neurosurgery the past few months for same.  Worse over the past 5 days.  Currently on prednisone and muscle relaxer but states she was not given "pain medications" because of some limitations at the office.  Denies new leg numbness/weakness.  No bowel or bladder incontinence.    Review of Systems  Positive: Back pain Negative: Incontinence, fever  Physical Exam  BP (!) 156/121   Pulse 89   Resp 18   SpO2 90%   Gen:   Awake, no distress   Resp:  Normal effort  MSK:   Moves extremities without difficulty  Other:    Medical Decision Making  Medically screening exam initiated at 4:50 AM.  Appropriate orders placed.  Tameisha Covell was informed that the remainder of the evaluation will be completed by another provider, this initial triage assessment does not replace that evaluation, and the importance of remaining in the ED until their evaluation is complete.  Acute on chronic low back pain.  No focal deficits in triage.   Larene Pickett, PA-C 10/03/21 0455    Drenda Freeze, MD 10/03/21 [phone]

## 2021-10-04 ENCOUNTER — Ambulatory Visit: Payer: Medicaid Other | Attending: Family Medicine | Admitting: Physical Therapy

## 2021-10-04 ENCOUNTER — Other Ambulatory Visit: Payer: Self-pay

## 2021-10-04 ENCOUNTER — Encounter: Payer: Self-pay | Admitting: Physical Therapy

## 2021-10-04 DIAGNOSIS — M5442 Lumbago with sciatica, left side: Secondary | ICD-10-CM | POA: Insufficient documentation

## 2021-10-04 DIAGNOSIS — R2689 Other abnormalities of gait and mobility: Secondary | ICD-10-CM | POA: Insufficient documentation

## 2021-10-04 DIAGNOSIS — M6281 Muscle weakness (generalized): Secondary | ICD-10-CM | POA: Diagnosis present
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
## 2021-10-04 NOTE — Patient Instructions (Signed)
Access Code: [email or web address] URL: [email or web address] Date: 10/04/2021 Prepared by: Shearon Balo  Exercises Prone Press Up On Elbows - 5 x daily - 7 x weekly - 3 sets - 10 reps Seated Sciatic Tensioner - 2 x daily - 7 x weekly - 2 sets - 10 reps

## 2021-10-09 ENCOUNTER — Ambulatory Visit: Payer: Medicaid Other | Admitting: Neurology

## 2021-10-16 ENCOUNTER — Ambulatory Visit: Payer: Medicaid Other

## 2021-10-18 ENCOUNTER — Encounter (HOSPITAL_COMMUNITY): Payer: Self-pay | Admitting: Emergency Medicine

## 2021-10-18 ENCOUNTER — Other Ambulatory Visit: Payer: Self-pay

## 2021-10-18 ENCOUNTER — Ambulatory Visit (HOSPITAL_COMMUNITY)
Admission: EM | Admit: 2021-10-18 | Discharge: 2021-10-18 | Disposition: A | Discharge status: Home / Self Care | Payer: Medicaid Other | Attending: Urgent Care | Admitting: Urgent Care

## 2021-10-18 ENCOUNTER — Ambulatory Visit: Payer: Medicaid Other | Admitting: Adult Health

## 2021-10-18 ENCOUNTER — Ambulatory Visit (INDEPENDENT_AMBULATORY_CARE_PROVIDER_SITE_OTHER): Payer: Medicaid Other

## 2021-10-18 DIAGNOSIS — U071 COVID-19: Secondary | ICD-10-CM | POA: Diagnosis present

## 2021-10-18 DIAGNOSIS — J4530 Mild persistent asthma, uncomplicated: Secondary | ICD-10-CM | POA: Diagnosis present

## 2021-10-18 DIAGNOSIS — R0789 Other chest pain: Secondary | ICD-10-CM | POA: Diagnosis not present

## 2021-10-18 DIAGNOSIS — R030 Elevated blood-pressure reading, without diagnosis of hypertension: Secondary | ICD-10-CM | POA: Insufficient documentation

## 2021-10-18 DIAGNOSIS — I10 Essential (primary) hypertension: Secondary | ICD-10-CM | POA: Insufficient documentation

## 2021-10-18 DIAGNOSIS — R059 Cough, unspecified: Secondary | ICD-10-CM | POA: Diagnosis not present

## 2021-10-18 DIAGNOSIS — R051 Acute cough: Secondary | ICD-10-CM | POA: Insufficient documentation

## 2021-10-18 DIAGNOSIS — R52 Pain, unspecified: Secondary | ICD-10-CM | POA: Insufficient documentation

## 2021-10-18 MED ORDER — BENZONATATE 100 MG PO CAPS: 100.0000 mg | ORAL_CAPSULE | Freq: Three times a day (TID) | ORAL | 0 refills | Status: DC | PRN

## 2021-10-18 MED ORDER — PROMETHAZINE-DM 6.25-15 MG/5ML PO SYRP: 5.0000 mL | ORAL_SOLUTION | Freq: Every evening | ORAL | 0 refills | Status: DC | PRN

## 2021-10-18 MED ORDER — PSEUDOEPHEDRINE HCL 30 MG PO TABS: 30.0000 mg | ORAL_TABLET | Freq: Three times a day (TID) | ORAL | 0 refills | Status: DC | PRN

## 2021-10-18 MED ORDER — AMLODIPINE BESYLATE 5 MG PO TABS: 5.0000 mg | ORAL_TABLET | Freq: Every day | ORAL | 0 refills | Status: AC

## 2021-10-18 MED ORDER — CETIRIZINE HCL 10 MG PO TABS: 10.0000 mg | ORAL_TABLET | Freq: Every day | ORAL | 0 refills | Status: DC

## 2021-10-18 NOTE — ED Triage Notes (Signed)
Pt is present today with a cough, nasal congestion, body aches, loss of appetite/smell and tested positive for Covid this morning. Pt states sx started Monday

## 2021-10-18 NOTE — Discharge Instructions (Addendum)
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
## 2021-10-19 LAB — SARS CORONAVIRUS 2 (TAT 6-24 HRS): SARS Coronavirus 2: POSITIVE — AB

## 2021-10-23 ENCOUNTER — Ambulatory Visit: Payer: Medicaid Other | Admitting: Physical Therapy

## 2021-10-25 ENCOUNTER — Encounter: Payer: Medicaid Other | Admitting: Physical Therapy

## 2021-10-25 ENCOUNTER — Ambulatory Visit: Payer: Medicaid Other | Admitting: Adult Health

## 2021-10-30 ENCOUNTER — Ambulatory Visit: Payer: Medicaid Other | Admitting: Physical Therapy

## 2021-11-01 ENCOUNTER — Encounter: Payer: Medicaid Other | Admitting: Physical Therapy

## 2021-11-06 ENCOUNTER — Ambulatory Visit: Payer: Medicaid Other | Admitting: Physical Therapy

## 2021-11-06 ENCOUNTER — Encounter: Payer: Medicaid Other | Admitting: Physical Therapy

## 2021-11-08 ENCOUNTER — Ambulatory Visit: Payer: Medicaid Other | Attending: Family Medicine | Admitting: Physical Therapy

## 2021-11-08 ENCOUNTER — Encounter: Payer: Medicaid Other | Admitting: Physical Therapy

## 2021-11-08 ENCOUNTER — Other Ambulatory Visit: Payer: Self-pay

## 2021-11-08 ENCOUNTER — Encounter: Payer: Self-pay | Admitting: Physical Therapy

## 2021-11-08 DIAGNOSIS — M6281 Muscle weakness (generalized): Secondary | ICD-10-CM | POA: Insufficient documentation

## 2021-11-08 DIAGNOSIS — M5442 Lumbago with sciatica, left side: Secondary | ICD-10-CM | POA: Insufficient documentation

## 2021-11-08 DIAGNOSIS — R2689 Other abnormalities of gait and mobility: Secondary | ICD-10-CM | POA: Diagnosis present
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
## 2021-11-27 ENCOUNTER — Ambulatory Visit: Payer: Medicaid Other | Admitting: Physical Therapy

## 2021-11-27 ENCOUNTER — Other Ambulatory Visit: Payer: Self-pay

## 2021-11-27 ENCOUNTER — Encounter: Payer: Self-pay | Admitting: Physical Therapy

## 2021-11-27 DIAGNOSIS — R2689 Other abnormalities of gait and mobility: Secondary | ICD-10-CM

## 2021-11-27 DIAGNOSIS — M5442 Lumbago with sciatica, left side: Secondary | ICD-10-CM

## 2021-11-27 DIAGNOSIS — M6281 Muscle weakness (generalized): Secondary | ICD-10-CM
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
## 2022-01-05 ENCOUNTER — Emergency Department (HOSPITAL_COMMUNITY)
Admission: EM | Admit: 2022-01-05 | Discharge: 2022-01-05 | Disposition: A | Discharge status: Home / Self Care | Payer: Medicaid Other | Attending: Emergency Medicine | Admitting: Emergency Medicine

## 2022-01-05 ENCOUNTER — Other Ambulatory Visit: Payer: Self-pay

## 2022-01-05 ENCOUNTER — Emergency Department (HOSPITAL_COMMUNITY): Payer: Medicaid Other

## 2022-01-05 ENCOUNTER — Encounter (HOSPITAL_COMMUNITY): Payer: Self-pay | Admitting: *Deleted

## 2022-01-05 DIAGNOSIS — M542 Cervicalgia: Secondary | ICD-10-CM | POA: Diagnosis present

## 2022-01-05 DIAGNOSIS — M436 Torticollis: Secondary | ICD-10-CM | POA: Diagnosis not present

## 2022-01-05 LAB — CBC WITH DIFFERENTIAL/PLATELET
Abs Immature Granulocytes: 0.02 10*3/uL (ref 0.00–0.07)
Basophils Absolute: 0 10*3/uL (ref 0.0–0.1)
Basophils Relative: 0 %
Eosinophils Absolute: 0.1 10*3/uL (ref 0.0–0.5)
Eosinophils Relative: 1 %
HCT: 42.9 % (ref 36.0–46.0)
Hemoglobin: 13.8 g/dL (ref 12.0–15.0)
Immature Granulocytes: 0 %
Lymphocytes Relative: 32 %
Lymphs Abs: 2.7 10*3/uL (ref 0.7–4.0)
MCH: 30 pg (ref 26.0–34.0)
MCHC: 32.2 g/dL (ref 30.0–36.0)
MCV: 93.3 fL (ref 80.0–100.0)
Monocytes Absolute: 0.8 10*3/uL (ref 0.1–1.0)
Monocytes Relative: 10 %
Neutro Abs: 4.9 10*3/uL (ref 1.7–7.7)
Neutrophils Relative %: 57 %
Platelets: 253 10*3/uL (ref 150–400)
RBC: 4.6 MIL/uL (ref 3.87–5.11)
RDW: 12.9 % (ref 11.5–15.5)
WBC: 8.6 10*3/uL (ref 4.0–10.5)
nRBC: 0 % (ref 0.0–0.2)

## 2022-01-05 LAB — I-STAT CHEM 8, ED
BUN: 10 mg/dL (ref 6–20)
Calcium, Ion: 1.12 mmol/L — ABNORMAL LOW (ref 1.15–1.40)
Chloride: 103 mmol/L (ref 98–111)
Creatinine, Ser: 0.9 mg/dL (ref 0.44–1.00)
Glucose, Bld: 129 mg/dL — ABNORMAL HIGH (ref 70–99)
HCT: 43 % (ref 36.0–46.0)
Hemoglobin: 14.6 g/dL (ref 12.0–15.0)
Potassium: 3.3 mmol/L — ABNORMAL LOW (ref 3.5–5.1)
Sodium: 137 mmol/L (ref 135–145)
TCO2: 23 mmol/L (ref 22–32)

## 2022-01-05 MED ORDER — KETOROLAC TROMETHAMINE 15 MG/ML IJ SOLN
15.0000 mg | Freq: Once | INTRAMUSCULAR | Status: AC
  Administered 2022-01-05: 15 mg via INTRAVENOUS
  Filled 2022-01-05: qty 1

## 2022-01-05 MED ORDER — LIDOCAINE 4 % EX PTCH: 1.0000 | MEDICATED_PATCH | Freq: Two times a day (BID) | CUTANEOUS | 0 refills | Status: DC

## 2022-01-05 MED ORDER — IOHEXOL 300 MG/ML  SOLN
75.0000 mL | Freq: Once | INTRAMUSCULAR | Status: AC | PRN
  Administered 2022-01-05: 75 mL via INTRAVENOUS

## 2022-01-05 MED ORDER — NAPROXEN 500 MG PO TABS: 500.0000 mg | ORAL_TABLET | Freq: Two times a day (BID) | ORAL | 0 refills | Status: DC

## 2022-01-05 MED ORDER — OXYCODONE-ACETAMINOPHEN 5-325 MG PO TABS: 1.0000 | ORAL_TABLET | Freq: Once | ORAL | Status: DC

## 2022-01-05 MED ORDER — DIAZEPAM 5 MG PO TABS: 5.0000 mg | ORAL_TABLET | Freq: Once | ORAL | Status: DC

## 2022-01-05 MED ORDER — LIDOCAINE 5 % EX PTCH
1.0000 | MEDICATED_PATCH | CUTANEOUS | Status: DC
  Administered 2022-01-05: 1 via TRANSDERMAL
  Filled 2022-01-05: qty 1

## 2022-01-05 NOTE — ED Triage Notes (Signed)
Neck pain since Thursday  she has an abscess at   the base of her skull

## 2022-01-05 NOTE — ED Provider Notes (Signed)
[facility] EMERGENCY DEPARTMENT Provider Note   CSN: [id number] Arrival date & time: 01/05/22  9983     History  Chief Complaint  Patient presents with   Torticollis    Victoria Collier is a 51 y.o. female.  HPI     51 year old female comes in with chief complaint of neck pain.  She reports that she started having discomfort to her neck on Wednesday.  Since then there has been increased swelling and pain and discomfort over her neck.  She initially thought that she might have just created neck, however today her family member noted a large nodule which was read.  Patient is tenderness with any kind of movement.  She does not have any history of diabetes.  Home Medications Prior to Admission medications   Medication Sig Start Date End Date Taking? Authorizing Provider  amLODipine (NORVASC) 5 MG tablet Take 1 tablet (5 mg total) by mouth daily. Patient taking differently: Take 5 mg by mouth daily as needed (high blood pressure). 10/18/21  Yes Jaynee Eagles, PA-C  benzonatate (TESSALON) 100 MG capsule Take 1-2 capsules (100-200 mg total) by mouth 3 (three) times daily as needed for cough. 10/18/21  Yes Jaynee Eagles, PA-C  cetirizine (ZYRTEC ALLERGY) 10 MG tablet Take 1 tablet (10 mg total) by mouth daily. Patient taking differently: Take 10 mg by mouth daily as needed for allergies or rhinitis. 10/18/21  Yes Jaynee Eagles, PA-C  gabapentin (NEURONTIN) 300 MG capsule Take 600 mg by mouth 2 (two) times daily as needed (neuropathy). 11/01/21  Yes [provider]  Lidocaine 4 % PTCH Apply 1 patch topically 2 (two) times daily. 01/05/22  Yes Koal Eslinger, MD  naproxen (NAPROSYN) 500 MG tablet Take 1 tablet (500 mg total) by mouth 2 (two) times daily. 01/05/22  Yes Varney Biles, MD  naratriptan (AMERGE) 2.5 MG tablet Take 1 tablet (2.5 mg total) by mouth 2 (two) times daily for 3 days to break prolonged migraine. Patient taking differently: Take 2.5 mg by mouth as  needed for migraine. 03/27/21 01/05/22 Yes   omeprazole (PRILOSEC) 20 MG capsule Take 1 capsule (20 mg total) by mouth daily. 03/20/21  Yes   oxyCODONE (ROXICODONE) 5 MG immediate release tablet Take 1 tablet (5 mg total) by mouth every 4 (four) hours as needed for severe pain. 08/27/21  Yes Davonna Belling, MD  promethazine-dextromethorphan (PROMETHAZINE-DM) 6.25-15 MG/5ML syrup Take 5 mLs by mouth at bedtime as needed for cough. 10/18/21  Yes Jaynee Eagles, PA-C  SUMAtriptan (IMITREX) 25 MG tablet Take 25 mg by mouth 2 (two) times daily as needed for migraine. 08/27/21  Yes [provider]  tiZANidine (ZANAFLEX) 4 MG tablet Take 1 tablet (4 mg total) by mouth every 6 (six) hours as needed for muscle spasms. 09/30/21  Yes Pearson Forster, NP  albuterol (VENTOLIN HFA) 108 (90 Base) MCG/ACT inhaler Inhale 2 puffs into the lungs every 4 (four) hours. Patient not taking: Reported on 01/05/2022 12/12/20     celecoxib (CELEBREX) 200 MG capsule Take 200 mg by mouth daily. Patient not taking: Reported on 10/04/2021 07/17/21   [provider]  levothyroxine (SYNTHROID) 125 MCG tablet Take 1 tablet (125 mcg total) by mouth daily. Patient not taking: Reported on 01/05/2022 02/08/21     predniSONE (STERAPRED UNI-PAK 21 TAB) 10 MG (21) TBPK tablet Take by mouth daily. Take 6 tabs by mouth daily  for 2 days, then 5 tabs for 2 days, then 4 tabs for 2 days, then  3 tabs for 2 days, 2 tabs for 2 days, then 1 tab by mouth daily for 2 days Patient not taking: Reported on 01/05/2022 09/30/21   Pearson Forster, NP  pseudoephedrine (SUDAFED) 30 MG tablet Take 1 tablet (30 mg total) by mouth every 8 (eight) hours as needed for congestion. Patient not taking: Reported on 01/05/2022 10/18/21   Jaynee Eagles, PA-C      Allergies    Acetaminophen, Aspirin, Excedrin tension headache [acetaminophen-caffeine], and Darvocet [propoxyphene n-acetaminophen]    Review of Systems   Review of Systems  Constitutional:  Positive for  activity change.   Physical Exam Updated Vital Signs BP 130/73    Pulse (!) 58    Temp 98.3 F (36.8 C) (Oral)    Resp 17    Ht 5\' 5"  (1.651 m)    Wt 117 kg    SpO2 96%    BMI 42.92 kg/m  Physical Exam Vitals and nursing note reviewed.  Constitutional:      Appearance: She is well-developed.  HENT:     Head: Atraumatic.  Neck:     Comments: There is a large erythematous nodule at the base of the neck that is tender to palpation. No fluctuance, but there is induration Cardiovascular:     Rate and Rhythm: Normal rate.  Pulmonary:     Effort: Pulmonary effort is normal.  Musculoskeletal:     Cervical back: Rigidity and tenderness present.  Skin:    General: Skin is warm and dry.  Neurological:     Mental Status: She is alert and oriented to person, place, and time.       ED Results / Procedures / Treatments   Labs (all labs ordered are listed, but only abnormal results are displayed) Labs Reviewed  I-STAT CHEM 8, ED - Abnormal; Notable for the following components:      Result Value   Potassium 3.3 (*)    Glucose, Bld 129 (*)    Calcium, Ion 1.12 (*)    All other components within normal limits  CBC WITH DIFFERENTIAL/PLATELET    EKG None  Radiology CT Soft Tissue Neck W Contrast  Result Date: 01/05/2022 CLINICAL DATA:  Soft tissue swelling, infection suspected, neck xray done EXAM: CT NECK WITH CONTRAST TECHNIQUE: Multidetector CT imaging of the neck was performed using the standard protocol following the bolus administration of intravenous contrast. CONTRAST:  37mL OMNIPAQUE IOHEXOL 300 MG/ML  SOLN COMPARISON:  CT cervical spine November 01, 2012. FINDINGS: Pharynx and larynx: Normal. No mass or swelling. Salivary glands: No inflammation, mass, or stone. Thyroid: Normal. Lymph nodes: Mildly prominent nodes bilaterally, not enlarged by CT criteria. Vascular: Limited evaluation due to non arterial timing. Major arteries in the neck appear to be grossly patent. Limited  intracranial: Negative. Visualized orbits: Negative. Mastoids and visualized paranasal sinuses: Clear. Skeleton: Moderate degenerative disc disease at C5-C6. Upper chest: Visualized lung apices are clear. Other: Mild soft tissue thickening/stranding in the posterior paraspinal soft tissues at the midcervical level appears chronic when comparing to prior CT of the cervical spine from 2013. No evidence of a discrete drainable fluid collection the neck. IMPRESSION: Unremarkable appearance of the neck. No focal inflammatory change or abscess. Electronically Signed   By: Margaretha Sheffield M.D.   On: 01/05/2022 10:33    Procedures Ultrasound ED Soft Tissue  Date/Time: 01/05/2022 10:22 AM Performed by: Varney Biles, MD Authorized by: Varney Biles, MD   Procedure details:    Indications: localization of abscess and evaluate  for cellulitis     Transverse view:  Visualized   Longitudinal view:  Visualized   Images: archived     Limitations:  Body habitus Location:    Location: neck     Side:  Midline Findings:     no abscess present    no cellulitis present    no foreign body present    Medications Ordered in ED Medications  ketorolac (TORADOL) 15 MG/ML injection 15 mg (has no administration in time range)  lidocaine (LIDODERM) 5 % 1 patch (has no administration in time range)  iohexol (OMNIPAQUE) 300 MG/ML solution 75 mL (75 mLs Intravenous Contrast Given 01/05/22 1017)    ED Course/ Medical Decision Making/ A&P Clinical Course as of 01/05/22 1059  Sat Jan 05, 2022  1059 Results of the ER work-up discussed with the patient.  CT reviewed independently.  No evidence of any abscess appreciated. I agree with the radiologist interpretation as well.  Patient comfortable with outpatient management and stretching exercises.  She has an appointment with PCP coming up next week, and will seek physical therapy if not getting better. [AN]    Clinical Course User Index [AN] Varney Biles, MD                            Medical Decision Making 51 year old woman comes in with chief complaint neck pain. She is noted to have large nodular, erythematous lesion with rigidity in the movement of the neck.  It appears that she has significant muscle spasms.  However the nodular lesion is erythematous and warm to touch.  Bedside ultrasound was completed to see if there is any evidence of abscess.  There is no clear evidence of abscess.  We will get a CT soft tissue to ensure there is no cyst, myositis, abscess that was missed on ultrasound or any evidence of cellulitis.  If the CT scan is reassuring then we will treat this like a muscle spasm and discussed with the patient if she would want [place] to proceed with dry needling.  Amount and/or Complexity of Data Reviewed Radiology: ordered and independent interpretation performed.           Final Clinical Impression(s) / ED Diagnoses Final diagnoses:  Torticollis  Neck pain    Rx / DC Orders ED Discharge Orders          Ordered    Lidocaine 4 % PTCH  2 times daily        01/05/22 1058    naproxen (NAPROSYN) 500 MG tablet  2 times daily        01/05/22 1058              Varney Biles, MD 01/05/22 1100

## 2022-01-05 NOTE — ED Notes (Signed)
Reviewed discharge instructions with patient. Follow-up care and medications reviewed. Patient  verbalized understanding. Patient A&Ox4, VSS, and ambulatory with steady gait upon discharge.  °

## 2022-01-05 NOTE — ED Notes (Signed)
Went into room and pt had removed pulse oximeter and BP cuff. Pt irritable and expressed frustration about getting and IV placed for the CT scan. Pt also complaining about not getting pain meds.

## 2022-01-05 NOTE — Discharge Instructions (Signed)
You are seen in the ER for neck pain.  We suspect that your torticollis  Please take the muscle relaxants and complete the stretching exercises we discussed. Also apply warm compresses.  See your primary care doctor in 3 to 5 days as planned.

## 2022-04-27 ENCOUNTER — Encounter (HOSPITAL_COMMUNITY): Payer: Self-pay | Admitting: Emergency Medicine

## 2022-04-27 ENCOUNTER — Emergency Department (HOSPITAL_COMMUNITY)
Admission: EM | Admit: 2022-04-27 | Discharge: 2022-04-27 | Disposition: A | Discharge status: Home / Self Care | Payer: Medicaid Other | Attending: Emergency Medicine | Admitting: Emergency Medicine

## 2022-04-27 ENCOUNTER — Other Ambulatory Visit: Payer: Self-pay

## 2022-04-27 DIAGNOSIS — Z20822 Contact with and (suspected) exposure to covid-19: Secondary | ICD-10-CM | POA: Diagnosis not present

## 2022-04-27 DIAGNOSIS — R0981 Nasal congestion: Secondary | ICD-10-CM | POA: Diagnosis present

## 2022-04-27 DIAGNOSIS — J0100 Acute maxillary sinusitis, unspecified: Secondary | ICD-10-CM | POA: Insufficient documentation

## 2022-04-27 LAB — RESP PANEL BY RT-PCR (FLU A&B, COVID) ARPGX2
Influenza A by PCR: NEGATIVE
Influenza B by PCR: NEGATIVE
SARS Coronavirus 2 by RT PCR: NEGATIVE

## 2022-04-27 MED ORDER — AMOXICILLIN-POT CLAVULANATE 875-125 MG PO TABS: 1.0000 | ORAL_TABLET | Freq: Two times a day (BID) | ORAL | 0 refills | Status: AC

## 2022-04-27 NOTE — Discharge Instructions (Addendum)
Follow up with your Physician  for recheck next week  

## 2022-04-27 NOTE — ED Provider Notes (Signed)
?[facility] ?Provider Note ? ? ?CSN: [id number] ?Arrival date & time: 04/27/22  1102 ? ?  ? ?History ? ?Chief Complaint  ?Patient presents with  ? Facial Pain  ?  Sinus pain  ? ? ?Victoria Collier is a 51 y.o. female. ? ?Pt complains of sinus congestion and pressure.  Pt reports drainage from nose.  Pt states began 1 week ago after going fishing.  Pt reports recnelty started using cpap again. Pt denies fever or chills.  Pt has had a cough and congestion  ? ?The history is provided by the patient.  ? ?  ? ?Home Medications ?Prior to Admission medications   ?Medication Sig Start Date End Date Taking? Authorizing Provider  ?amoxicillin-clavulanate (AUGMENTIN) 875-125 MG tablet Take 1 tablet by mouth 2 (two) times daily for 10 days. 04/27/22 05/07/22 Yes Fransico Meadow, PA-C  ?albuterol (VENTOLIN HFA) 108 (90 Base) MCG/ACT inhaler Inhale 2 puffs into the lungs every 4 (four) hours. ?Patient not taking: Reported on 01/05/2022 12/12/20     ?amLODipine (NORVASC) 5 MG tablet Take 1 tablet (5 mg total) by mouth daily. ?Patient taking differently: Take 5 mg by mouth daily as needed (high blood pressure). 10/18/21   Jaynee Eagles, PA-C  ?benzonatate (TESSALON) 100 MG capsule Take 1-2 capsules (100-200 mg total) by mouth 3 (three) times daily as needed for cough. 10/18/21   Jaynee Eagles, PA-C  ?celecoxib (CELEBREX) 200 MG capsule Take 200 mg by mouth daily. ?Patient not taking: Reported on 10/04/2021 07/17/21   [provider]  ?cetirizine (ZYRTEC ALLERGY) 10 MG tablet Take 1 tablet (10 mg total) by mouth daily. ?Patient taking differently: Take 10 mg by mouth daily as needed for allergies or rhinitis. 10/18/21   Jaynee Eagles, PA-C  ?gabapentin (NEURONTIN) 300 MG capsule Take 600 mg by mouth 2 (two) times daily as needed (neuropathy). 11/01/21   [provider]  ?levothyroxine (SYNTHROID) 125 MCG tablet Take 1 tablet (125 mcg total) by mouth daily. ?Patient not taking: Reported on  01/05/2022 02/08/21     ?Lidocaine 4 % PTCH Apply 1 patch topically 2 (two) times daily. 01/05/22   Varney Biles, MD  ?naproxen (NAPROSYN) 500 MG tablet Take 1 tablet (500 mg total) by mouth 2 (two) times daily. 01/05/22   Varney Biles, MD  ?naratriptan (AMERGE) 2.5 MG tablet Take 1 tablet (2.5 mg total) by mouth 2 (two) times daily for 3 days to break prolonged migraine. ?Patient taking differently: Take 2.5 mg by mouth as needed for migraine. 03/27/21 01/05/22    ?omeprazole (PRILOSEC) 20 MG capsule Take 1 capsule (20 mg total) by mouth daily. 03/20/21     ?oxyCODONE (ROXICODONE) 5 MG immediate release tablet Take 1 tablet (5 mg total) by mouth every 4 (four) hours as needed for severe pain. 08/27/21   Davonna Belling, MD  ?predniSONE (STERAPRED UNI-PAK 21 TAB) 10 MG (21) TBPK tablet Take by mouth daily. Take 6 tabs by mouth daily  for 2 days, then 5 tabs for 2 days, then 4 tabs for 2 days, then 3 tabs for 2 days, 2 tabs for 2 days, then 1 tab by mouth daily for 2 days ?Patient not taking: Reported on 01/05/2022 09/30/21   Pearson Forster, NP  ?promethazine-dextromethorphan (PROMETHAZINE-DM) 6.25-15 MG/5ML syrup Take 5 mLs by mouth at bedtime as needed for cough. 10/18/21   Jaynee Eagles, PA-C  ?pseudoephedrine (SUDAFED) 30 MG tablet Take 1 tablet (30 mg total) by mouth every 8 (eight) hours as  needed for congestion. ?Patient not taking: Reported on 01/05/2022 10/18/21   Jaynee Eagles, PA-C  ?SUMAtriptan (IMITREX) 25 MG tablet Take 25 mg by mouth 2 (two) times daily as needed for migraine. 08/27/21   [provider]  ?tiZANidine (ZANAFLEX) 4 MG tablet Take 1 tablet (4 mg total) by mouth every 6 (six) hours as needed for muscle spasms. 09/30/21   Pearson Forster, NP  ?   ? ?Allergies    ?Acetaminophen, Aspirin, Excedrin tension headache [acetaminophen-caffeine], and Darvocet [propoxyphene n-acetaminophen]   ? ?Review of Systems   ?Review of Systems  ?All other systems reviewed and are negative. ? ?Physical Exam ?Updated  Vital Signs ?BP (!) 157/101   Pulse 82   Temp 98.9 ?F (37.2 ?C) (Oral)   Resp 20   SpO2 98%  ?Physical Exam ?Vitals and nursing note reviewed.  ?Constitutional:   ?   Appearance: She is well-developed.  ?HENT:  ?   Head: Normocephalic.  ?Eyes:  ?   Pupils: Pupils are equal, round, and reactive to light.  ?Cardiovascular:  ?   Rate and Rhythm: Normal rate and regular rhythm.  ?Pulmonary:  ?   Effort: Pulmonary effort is normal.  ?Abdominal:  ?   General: There is no distension.  ?Musculoskeletal:     ?   General: Normal range of motion.  ?   Cervical back: Normal range of motion.  ?Skin: ?   General: Skin is warm.  ?Neurological:  ?   General: No focal deficit present.  ?   Mental Status: She is alert and oriented to person, place, and time.  ?Psychiatric:     ?   Mood and Affect: Mood normal.  ? ? ?ED Results / Procedures / Treatments   ?Labs ?(all labs ordered are listed, but only abnormal results are displayed) ?Labs Reviewed  ?RESP PANEL BY RT-PCR (FLU A&B, COVID) [id number]  ? ? ?EKG ?None ? ?Radiology ?No results found. ? ?Procedures ?Procedures  ? ? ?Medications Ordered in ED ?Medications - No data to display ? ?ED Course/ Medical Decision Making/ A&P ?  ?                        ?Medical Decision Making ?Risk ?Prescription drug management. ? ? ?MDM: symptoms for over a week,  sinus discomfort.  Pt given rx for Augmentin  ? ? ? ? ? ? ? ?Final Clinical Impression(s) / ED Diagnoses ?Final diagnoses:  ?Acute non-recurrent maxillary sinusitis  ? ? ?Rx / DC Orders ?ED Discharge Orders   ? ?      Ordered  ?  amoxicillin-clavulanate (AUGMENTIN) 875-125 MG tablet  2 times daily       ? 04/27/22 1422  ? ?  ?  ? ?  ? ?An After Visit Summary was printed and given to the patient.  ?  ?Fransico Meadow, PA-C ?04/27/22 1431 ? ?  ?Isla Pence, MD ?04/28/22 1612 ? ?

## 2022-04-27 NOTE — ED Triage Notes (Signed)
Pt reports headache, runny nose, itchy eyes, and headache that started on Monday after fishing.  Denies cough. ?

## 2022-07-03 IMAGING — MR MR LUMBAR SPINE W/O CM
4 of 5 series · 19 of 48 positions shown · non-contrast
Comparison: Lumbar spine radiographs 07/13/2021

CLINICAL DATA: Low back pain with left hip and leg pain

EXAM:
MRI LUMBAR SPINE WITHOUT CONTRAST
TECHNIQUE: Multiplanar, multisequence MR imaging of the lumbar spine was
performed. No intravenous contrast was administered.

[Series 3: T2 · sagittal · 4.0mm · 0.55mm/px · 6 of 15 slices shown (1 of 2)]
[im 1/15]
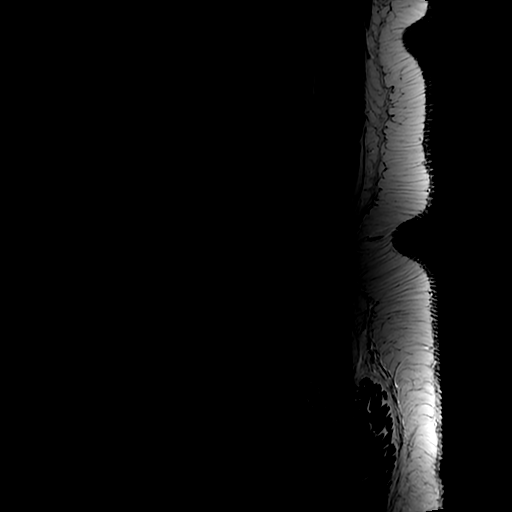
[im 3/15]
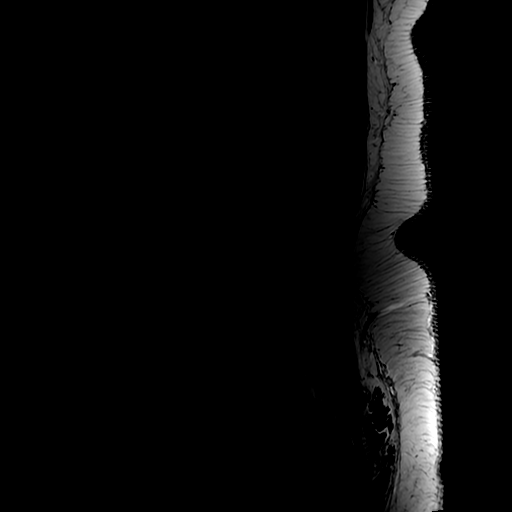
[im 6/15]
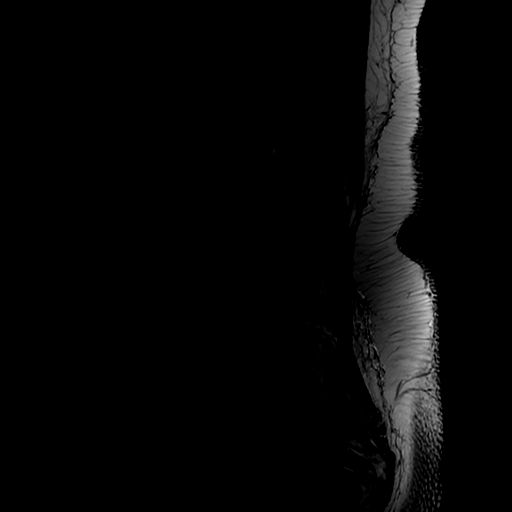
[im 9/15]
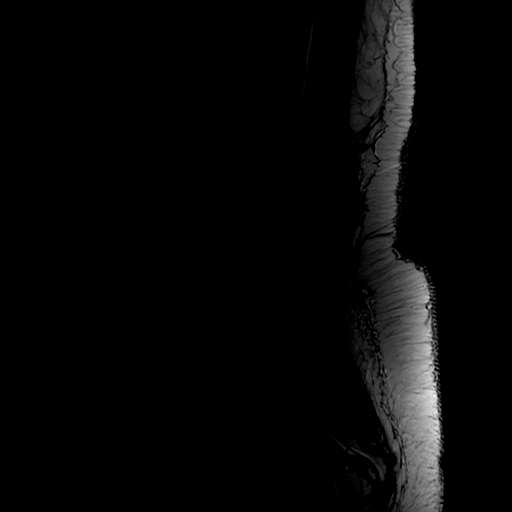
[im 12/15]
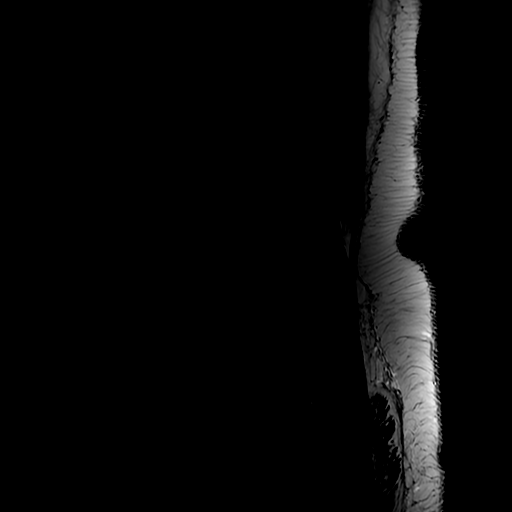
[im 15/15]
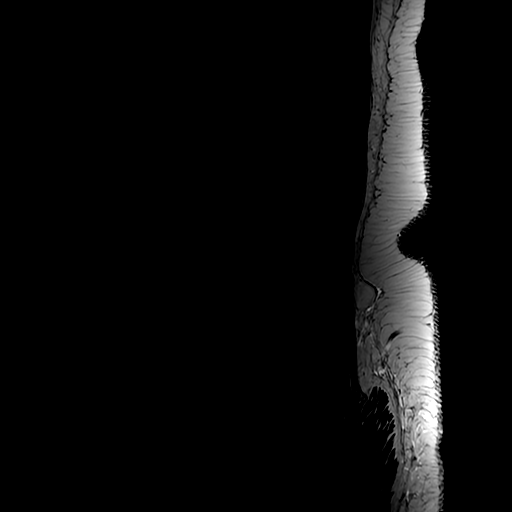

[Series 5: T1 · sagittal · 4.0mm · 0.51mm/px · 3 of 15 slices shown (1 of 2)]
[im 3/15]
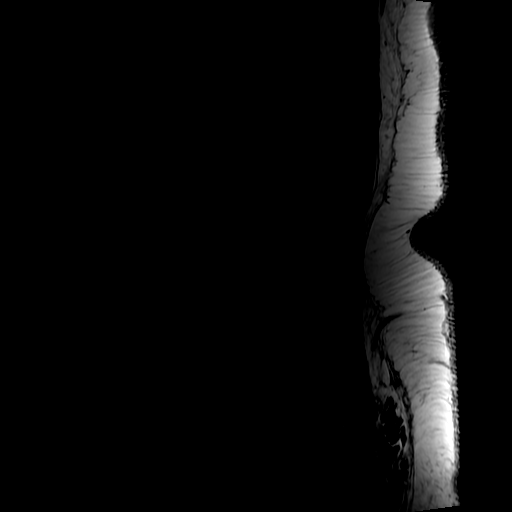
[im 9/15]
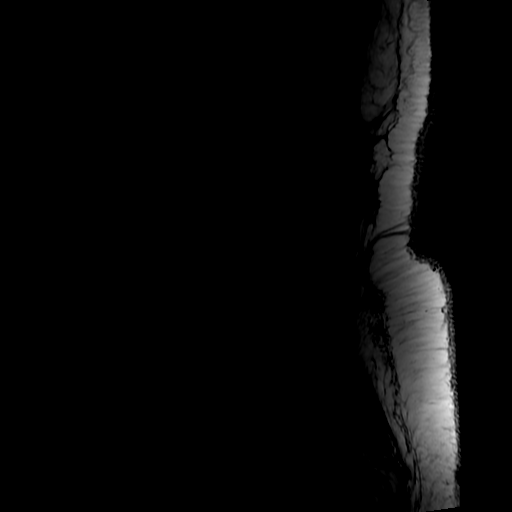
[im 15/15]
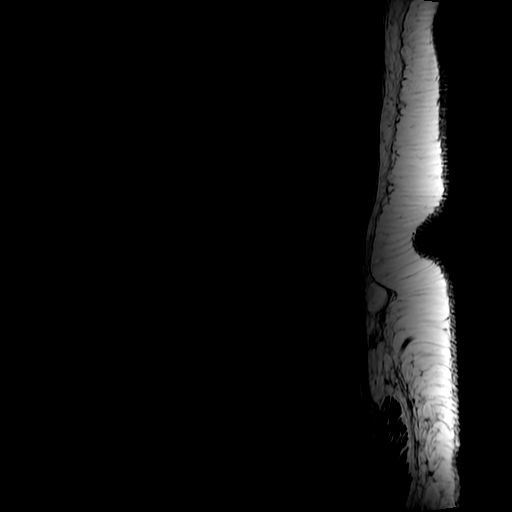

[Series 6: T2 · axial · 4.0mm · 0.39mm/px · z∈[-50,+128]mm · 7 of 39 slices shown (2 of 2)]
[im 1/39]
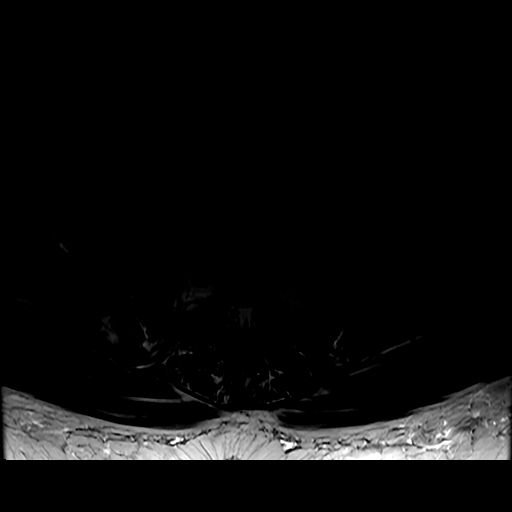
[im 6/39]
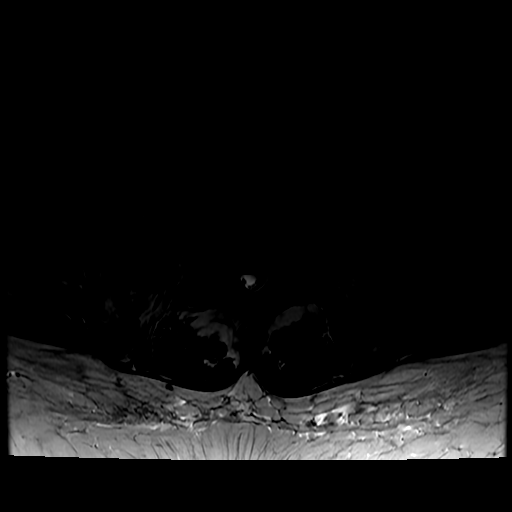
[im 11/39]
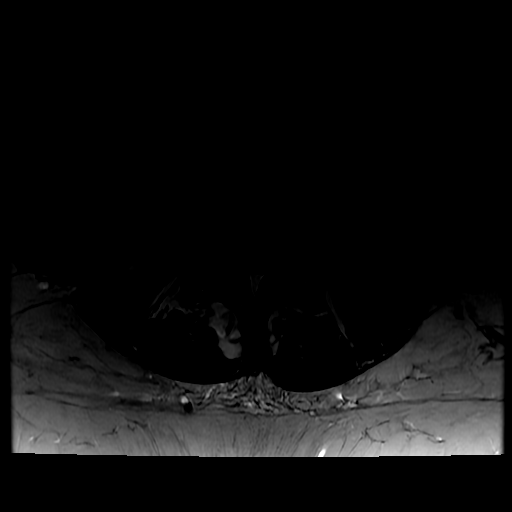
[im 17/39]
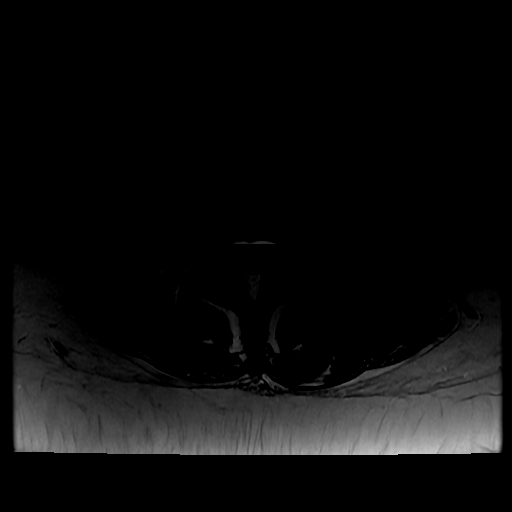
[im 20/39]
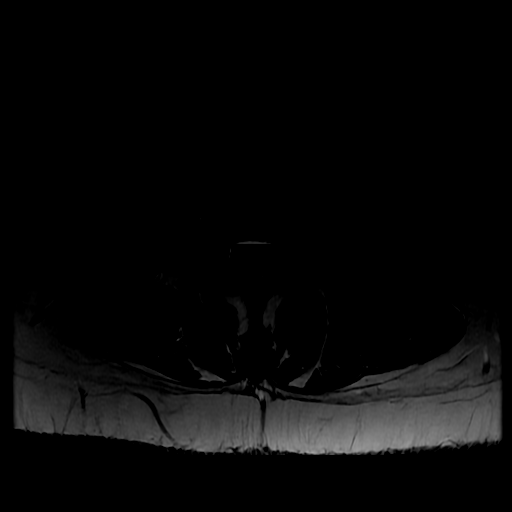
[im 22/39]
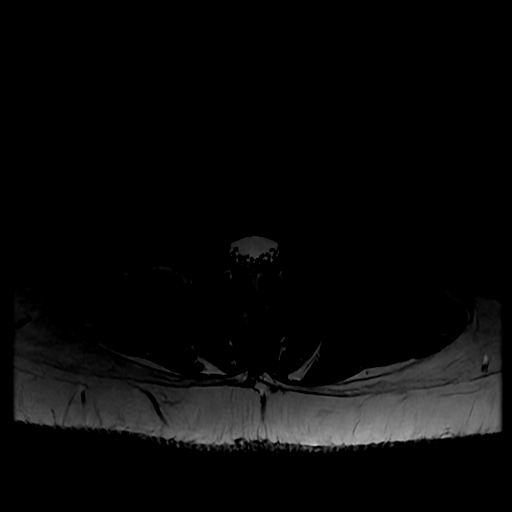
[im 33/39]
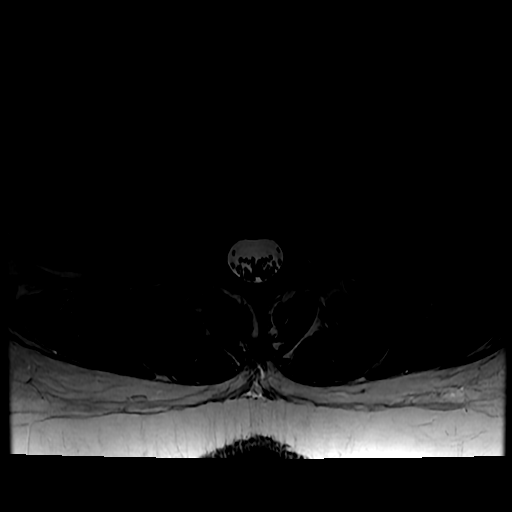

[Series 7: T1 · axial · 4.0mm · 0.39mm/px · z∈[-25,+128]mm · 3 of 39 slices shown (2 of 2)]
[im 6/39]
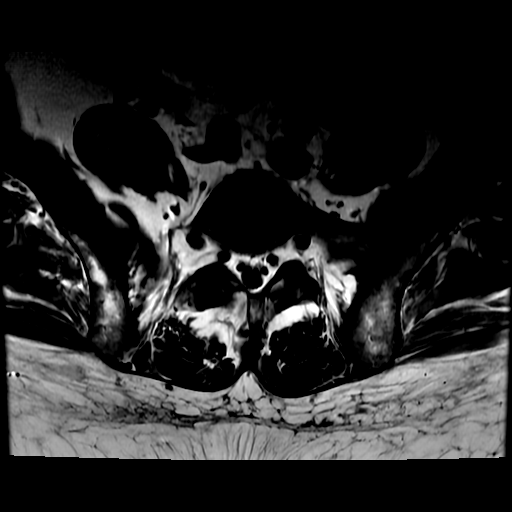
[im 20/39]
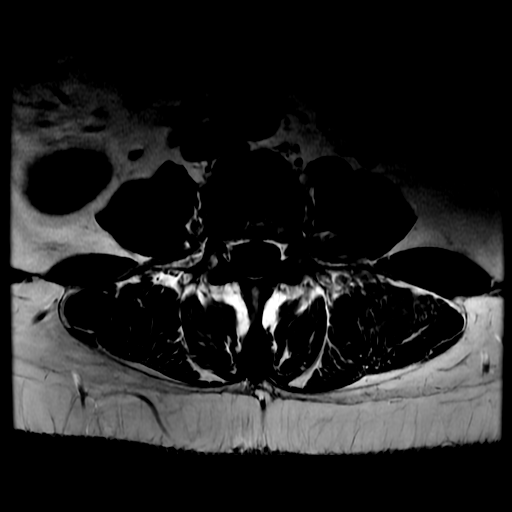
[im 33/39]
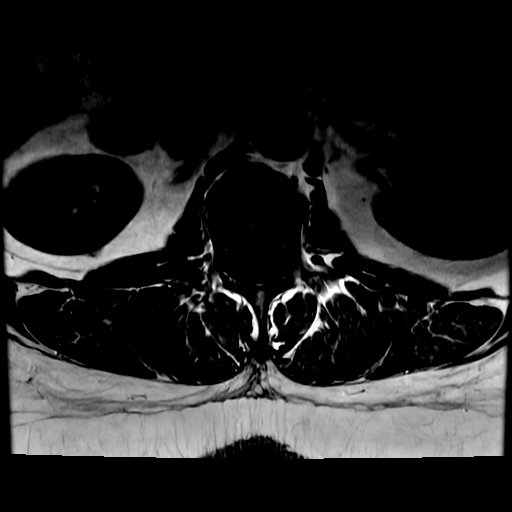

[19 of 48 positions shown; findings below may reference images not displayed]

FINDINGS: Segmentation:  Normal

Alignment:  Slight retrolisthesis L2-3, L3-4, L4-5

Vertebrae:  Normal bone marrow.  Negative for fracture or mass.

Conus medullaris and cauda equina: Conus extends to the L1-2 level.
Conus and cauda equina appear normal.

Paraspinal and other soft tissues: Negative for paraspinous mass,
adenopathy, or soft tissue edema.

Disc levels:

L1-2: Negative

L2-3: Mild disc degeneration and disc bulging. Negative for stenosis

L3-4: Mild disc degeneration and disc bulging. Negative for stenosis

L4-5: Disc degeneration and disc bulging. Superimposed central disc
protrusion. No significant neural compression. Mild facet
hypertrophy bilaterally. Neural foramina patent bilaterally.

L5-S1: Negative
IMPRESSION: Mild disc bulging L2-3 and L3-4

Disc bulging and central disc protrusion L4-5 without neural
impingement.

## 2022-07-09 ENCOUNTER — Emergency Department (HOSPITAL_COMMUNITY): Payer: Medicaid Other

## 2022-07-09 ENCOUNTER — Encounter (HOSPITAL_COMMUNITY): Payer: Self-pay

## 2022-07-09 ENCOUNTER — Emergency Department (HOSPITAL_COMMUNITY)
Admission: EM | Admit: 2022-07-09 | Discharge: 2022-07-10 | Discharge status: Against Medical Advice | Payer: Medicaid Other | Attending: Emergency Medicine | Admitting: Emergency Medicine

## 2022-07-09 ENCOUNTER — Other Ambulatory Visit: Payer: Self-pay

## 2022-07-09 DIAGNOSIS — R0789 Other chest pain: Secondary | ICD-10-CM | POA: Insufficient documentation

## 2022-07-09 DIAGNOSIS — R55 Syncope and collapse: Secondary | ICD-10-CM | POA: Diagnosis not present

## 2022-07-09 DIAGNOSIS — Z5321 Procedure and treatment not carried out due to patient leaving prior to being seen by health care provider: Secondary | ICD-10-CM | POA: Diagnosis not present

## 2022-07-09 DIAGNOSIS — R42 Dizziness and giddiness: Secondary | ICD-10-CM | POA: Insufficient documentation

## 2022-07-09 DIAGNOSIS — M546 Pain in thoracic spine: Secondary | ICD-10-CM | POA: Insufficient documentation

## 2022-07-09 DIAGNOSIS — R0602 Shortness of breath: Secondary | ICD-10-CM | POA: Insufficient documentation

## 2022-07-09 DIAGNOSIS — I10 Essential (primary) hypertension: Secondary | ICD-10-CM | POA: Diagnosis not present

## 2022-07-09 DIAGNOSIS — R61 Generalized hyperhidrosis: Secondary | ICD-10-CM | POA: Insufficient documentation

## 2022-07-09 LAB — BASIC METABOLIC PANEL
Anion gap: 10 (ref 5–15)
BUN: 12 mg/dL (ref 6–20)
CO2: 25 mmol/L (ref 22–32)
Calcium: 9.7 mg/dL (ref 8.9–10.3)
Chloride: 103 mmol/L (ref 98–111)
Creatinine, Ser: 1.11 mg/dL — ABNORMAL HIGH (ref 0.44–1.00)
GFR, Estimated: 59 mL/min — ABNORMAL LOW (ref >60)
Glucose, Bld: 123 mg/dL — ABNORMAL HIGH (ref 70–99)
Potassium: 3.9 mmol/L (ref 3.5–5.1)
Sodium: 138 mmol/L (ref 135–145)

## 2022-07-09 LAB — CBC
HCT: 46.2 % — ABNORMAL HIGH (ref 36.0–46.0)
Hemoglobin: 15.1 g/dL — ABNORMAL HIGH (ref 12.0–15.0)
MCH: 30.9 pg (ref 26.0–34.0)
MCHC: 32.7 g/dL (ref 30.0–36.0)
MCV: 94.5 fL (ref 80.0–100.0)
Platelets: 254 10*3/uL (ref 150–400)
RBC: 4.89 MIL/uL (ref 3.87–5.11)
RDW: 12.8 % (ref 11.5–15.5)
WBC: 10.7 10*3/uL — ABNORMAL HIGH (ref 4.0–10.5)
nRBC: 0 % (ref 0.0–0.2)

## 2022-07-09 LAB — I-STAT BETA HCG BLOOD, ED (MC, WL, AP ONLY): I-stat hCG, quantitative: <5 m[IU]/mL (ref <5)

## 2022-07-09 LAB — TROPONIN I (HIGH SENSITIVITY): Troponin I (High Sensitivity): 9 ng/L (ref <18)
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
## 2022-07-09 NOTE — ED Notes (Signed)
Pt decided to leave while waiting for a room.  

## 2022-07-11 ENCOUNTER — Encounter (HOSPITAL_COMMUNITY): Payer: Self-pay | Admitting: Emergency Medicine

## 2022-07-11 ENCOUNTER — Emergency Department (HOSPITAL_COMMUNITY)
Admission: EM | Admit: 2022-07-11 | Discharge: 2022-07-12 | Disposition: A | Discharge status: Home / Self Care | Payer: Medicaid Other | Attending: Emergency Medicine | Admitting: Emergency Medicine

## 2022-07-11 ENCOUNTER — Emergency Department (HOSPITAL_COMMUNITY): Payer: Medicaid Other

## 2022-07-11 DIAGNOSIS — E039 Hypothyroidism, unspecified: Secondary | ICD-10-CM | POA: Diagnosis not present

## 2022-07-11 DIAGNOSIS — R519 Headache, unspecified: Secondary | ICD-10-CM

## 2022-07-11 DIAGNOSIS — R55 Syncope and collapse: Secondary | ICD-10-CM | POA: Insufficient documentation

## 2022-07-11 DIAGNOSIS — I10 Essential (primary) hypertension: Secondary | ICD-10-CM | POA: Insufficient documentation

## 2022-07-11 DIAGNOSIS — Z79899 Other long term (current) drug therapy: Secondary | ICD-10-CM | POA: Insufficient documentation

## 2022-07-11 LAB — URINALYSIS, ROUTINE W REFLEX MICROSCOPIC
Bacteria, UA: NONE SEEN
Bilirubin Urine: NEGATIVE
Glucose, UA: NEGATIVE mg/dL
Ketones, ur: NEGATIVE mg/dL
Leukocytes,Ua: NEGATIVE
Nitrite: NEGATIVE
Protein, ur: 30 mg/dL — AB
Specific Gravity, Urine: 1.021 (ref 1.005–1.030)
pH: 5 (ref 5.0–8.0)

## 2022-07-11 LAB — BASIC METABOLIC PANEL
Anion gap: 12 (ref 5–15)
BUN: 11 mg/dL (ref 6–20)
CO2: 23 mmol/L (ref 22–32)
Calcium: 9.4 mg/dL (ref 8.9–10.3)
Chloride: 102 mmol/L (ref 98–111)
Creatinine, Ser: 1.1 mg/dL — ABNORMAL HIGH (ref 0.44–1.00)
GFR, Estimated: >60 mL/min (ref >60)
Glucose, Bld: 124 mg/dL — ABNORMAL HIGH (ref 70–99)
Potassium: 4 mmol/L (ref 3.5–5.1)
Sodium: 137 mmol/L (ref 135–145)

## 2022-07-11 LAB — CBC WITH DIFFERENTIAL/PLATELET
Abs Immature Granulocytes: 0.04 K/uL (ref 0.00–0.07)
Basophils Absolute: 0 K/uL (ref 0.0–0.1)
Basophils Relative: 0 %
Eosinophils Absolute: 0.1 K/uL (ref 0.0–0.5)
Eosinophils Relative: 1 %
HCT: 46.3 % — ABNORMAL HIGH (ref 36.0–46.0)
Hemoglobin: 15.4 g/dL — ABNORMAL HIGH (ref 12.0–15.0)
Immature Granulocytes: 1 %
Lymphocytes Relative: 27 %
Lymphs Abs: 2.3 K/uL (ref 0.7–4.0)
MCH: 30.9 pg (ref 26.0–34.0)
MCHC: 33.3 g/dL (ref 30.0–36.0)
MCV: 92.8 fL (ref 80.0–100.0)
Monocytes Absolute: 0.8 K/uL (ref 0.1–1.0)
Monocytes Relative: 9 %
Neutro Abs: 5.4 K/uL (ref 1.7–7.7)
Neutrophils Relative %: 62 %
Platelets: 251 K/uL (ref 150–400)
RBC: 4.99 MIL/uL (ref 3.87–5.11)
RDW: 12.8 % (ref 11.5–15.5)
WBC: 8.7 K/uL (ref 4.0–10.5)
nRBC: 0 % (ref 0.0–0.2)

## 2022-07-11 MED ORDER — SODIUM CHLORIDE 0.9 % IV SOLN
12.5000 mg | Freq: Four times a day (QID) | INTRAVENOUS | Status: DC | PRN
  Administered 2022-07-11: 12.5 mg via INTRAVENOUS
  Filled 2022-07-11: qty 0.5

## 2022-07-11 MED ORDER — AMLODIPINE BESYLATE 5 MG PO TABS: 5.0000 mg | ORAL_TABLET | Freq: Once | ORAL | Status: DC

## 2022-07-11 MED ORDER — KETOROLAC TROMETHAMINE 30 MG/ML IJ SOLN
30.0000 mg | Freq: Once | INTRAMUSCULAR | Status: AC
  Administered 2022-07-12: 30 mg via INTRAMUSCULAR
  Filled 2022-07-11: qty 1

## 2022-07-11 MED ORDER — SODIUM CHLORIDE 0.9 % IV BOLUS
1000.0000 mL | Freq: Once | INTRAVENOUS | Status: AC
  Administered 2022-07-11: 1000 mL via INTRAVENOUS

## 2022-07-11 MED ORDER — PROMETHAZINE HCL 25 MG PO TABS: 25.0000 mg | ORAL_TABLET | Freq: Four times a day (QID) | ORAL | 0 refills | Status: DC | PRN

## 2022-07-11 NOTE — ED Provider Notes (Signed)
[facility] EMERGENCY DEPARTMENT Provider Note   CSN: [id number] Arrival date & time: 07/11/22  1206     History  Chief Complaint  Patient presents with   Hypertension    Victoria Collier is a 51 y.o. female hx of HTN, hypothyroidism, here presenting with headache and syncope.  Patient states that about a week ago she saw her doctor and was noted to be hypertensive and her blood pressure medicine was changed.  Patient states that she had an episode of syncope about 2 days ago.  She came to the ER and had negative troponins at that time and left without being seen.  She states that she has persistent headache since then.  She did not pass out again.  She states that she has been taking her Imitrex with minimal relief.  She states that this is worse than her usual headaches  The history is provided by the patient.       Home Medications Prior to Admission medications   Medication Sig Start Date End Date Taking? Authorizing Provider  albuterol (VENTOLIN HFA) 108 (90 Base) MCG/ACT inhaler Inhale 2 puffs into the lungs every 4 (four) hours. Patient not taking: Reported on 01/05/2022 12/12/20     amLODipine (NORVASC) 5 MG tablet Take 1 tablet (5 mg total) by mouth daily. Patient taking differently: Take 5 mg by mouth daily as needed (high blood pressure). 10/18/21   Jaynee Eagles, PA-C  benzonatate (TESSALON) 100 MG capsule Take 1-2 capsules (100-200 mg total) by mouth 3 (three) times daily as needed for cough. 10/18/21   Jaynee Eagles, PA-C  celecoxib (CELEBREX) 200 MG capsule Take 200 mg by mouth daily. Patient not taking: Reported on 10/04/2021 07/17/21   [provider]  cetirizine (ZYRTEC ALLERGY) 10 MG tablet Take 1 tablet (10 mg total) by mouth daily. Patient taking differently: Take 10 mg by mouth daily as needed for allergies or rhinitis. 10/18/21   Jaynee Eagles, PA-C  gabapentin (NEURONTIN) 300 MG capsule Take 600 mg by mouth 2 (two) times daily as needed  (neuropathy). 11/01/21   [provider]  levothyroxine (SYNTHROID) 125 MCG tablet Take 1 tablet (125 mcg total) by mouth daily. Patient not taking: Reported on 01/05/2022 02/08/21     Lidocaine 4 % PTCH Apply 1 patch topically 2 (two) times daily. 01/05/22   Varney Biles, MD  naproxen (NAPROSYN) 500 MG tablet Take 1 tablet (500 mg total) by mouth 2 (two) times daily. 01/05/22   Varney Biles, MD  naratriptan (AMERGE) 2.5 MG tablet Take 1 tablet (2.5 mg total) by mouth 2 (two) times daily for 3 days to break prolonged migraine. Patient taking differently: Take 2.5 mg by mouth as needed for migraine. 03/27/21 01/05/22    omeprazole (PRILOSEC) 20 MG capsule Take 1 capsule (20 mg total) by mouth daily. 03/20/21     oxyCODONE (ROXICODONE) 5 MG immediate release tablet Take 1 tablet (5 mg total) by mouth every 4 (four) hours as needed for severe pain. 08/27/21   Davonna Belling, MD  predniSONE (STERAPRED UNI-PAK 21 TAB) 10 MG (21) TBPK tablet Take by mouth daily. Take 6 tabs by mouth daily  for 2 days, then 5 tabs for 2 days, then 4 tabs for 2 days, then 3 tabs for 2 days, 2 tabs for 2 days, then 1 tab by mouth daily for 2 days Patient not taking: Reported on 01/05/2022 09/30/21   Pearson Forster, NP  promethazine-dextromethorphan (PROMETHAZINE-DM) 6.25-15 MG/5ML syrup Take 5 mLs by  mouth at bedtime as needed for cough. 10/18/21   Jaynee Eagles, PA-C  pseudoephedrine (SUDAFED) 30 MG tablet Take 1 tablet (30 mg total) by mouth every 8 (eight) hours as needed for congestion. Patient not taking: Reported on 01/05/2022 10/18/21   Jaynee Eagles, PA-C  SUMAtriptan (IMITREX) 25 MG tablet Take 25 mg by mouth 2 (two) times daily as needed for migraine. 08/27/21   [provider]  tiZANidine (ZANAFLEX) 4 MG tablet Take 1 tablet (4 mg total) by mouth every 6 (six) hours as needed for muscle spasms. 09/30/21   Pearson Forster, NP      Allergies    Acetaminophen, Aspirin, Excedrin tension headache  [acetaminophen-caffeine], and Darvocet [propoxyphene n-acetaminophen]    Review of Systems   Review of Systems  Neurological:  Positive for headaches.  All other systems reviewed and are negative.   Physical Exam Updated Vital Signs BP (!) 164/114   Pulse 80   Temp 97.7 F (36.5 C) (Oral)   Resp 18   SpO2 100%  Physical Exam Vitals and nursing note reviewed.  Constitutional:      Comments: Slightly uncomfortable   HENT:     Head: Normocephalic.     Nose: Nose normal.     Mouth/Throat:     Mouth: Mucous membranes are moist.  Eyes:     Extraocular Movements: Extraocular movements intact.     Pupils: Pupils are equal, round, and reactive to light.  Cardiovascular:     Rate and Rhythm: Normal rate and regular rhythm.     Pulses: Normal pulses.     Heart sounds: Normal heart sounds.  Pulmonary:     Effort: Pulmonary effort is normal.     Breath sounds: Normal breath sounds.  Abdominal:     General: Abdomen is flat.     Palpations: Abdomen is soft.  Musculoskeletal:        General: Normal range of motion.     Cervical back: Normal range of motion and neck supple.  Skin:    General: Skin is warm.     Capillary Refill: Capillary refill takes less than 2 seconds.  Neurological:     General: No focal deficit present.     Mental Status: She is alert and oriented to person, place, and time.     Comments: Cranial nerves II to XII intact.  Patient has normal strength and sensation bilateral arms and legs.  Patient has normal gait  Psychiatric:        Mood and Affect: Mood normal.        Behavior: Behavior normal.     ED Results / Procedures / Treatments   Labs (all labs ordered are listed, but only abnormal results are displayed) Labs Reviewed  BASIC METABOLIC PANEL - Abnormal; Notable for the following components:      Result Value   Glucose, Bld 124 (*)    Creatinine, Ser 1.10 (*)    All other components within normal limits  CBC WITH DIFFERENTIAL/PLATELET -  Abnormal; Notable for the following components:   Hemoglobin 15.4 (*)    HCT 46.3 (*)    All other components within normal limits  URINALYSIS, ROUTINE W REFLEX MICROSCOPIC - Abnormal; Notable for the following components:   APPearance HAZY (*)    Hgb urine dipstick SMALL (*)    Protein, ur 30 (*)    All other components within normal limits    EKG EKG Interpretation  Date/Time:  Thursday July 11 2022 12:30:29 EDT Ventricular  Rate:  89 PR Interval:  150 QRS Duration: 86 QT Interval:  354 QTC Calculation: 430 R Axis:   -1 Text Interpretation: Normal sinus rhythm Normal ECG When compared with ECG of 09-Jul-2022 21:15, PREVIOUS ECG IS PRESENT Confirmed by Wandra Arthurs [phone]) on 07/11/2022 9:05:42 PM  Radiology No results found.  Procedures Procedures    Medications Ordered in ED Medications  promethazine (PHENERGAN) 12.5 mg in sodium chloride 0.9 % 50 mL IVPB (12.5 mg Intravenous New Bag/Given 07/11/22 2214)  sodium chloride 0.9 % bolus 1,000 mL (1,000 mLs Intravenous New Bag/Given 07/11/22 2212)    ED Course/ Medical Decision Making/ A&P                           Medical Decision Making Alliana Mcauliff is a 51 y.o. female here presenting with headache and syncope.  Patient had syncope 2 days ago.  Patient has been having persistent headache.  Plan to get labs, CT head to r/o bleed.  We will give migraine cocktail and reassess.   11:48 PM I reviewed patient's labs which were unremarkable.  CT head did not show any bleeding.  Patient felt better after migraine cocktail.  Stable for discharge    Problems Addressed: Hypertension, unspecified type: acute illness or injury Nonintractable headache, unspecified chronicity pattern, unspecified headache type: acute illness or injury  Amount and/or Complexity of Data Reviewed Labs: ordered. Decision-making details documented in ED Course. Radiology: ordered and independent interpretation performed. Decision-making details  documented in ED Course. ECG/medicine tests: ordered and independent interpretation performed. Decision-making details documented in ED Course.    Final Clinical Impression(s) / ED Diagnoses Final diagnoses:  None    Rx / DC Orders ED Discharge Orders     None         Drenda Freeze, MD 07/11/22 2349

## 2022-07-11 NOTE — ED Provider Triage Note (Signed)
Emergency Medicine Provider Triage Evaluation Note  Victoria Collier , a 51 y.o. female  was evaluated in triage.  Pt complains of HTN onset several days. Has associated headache, photophobia, dizziness, lightheadedness, urinary frequency.  Pt was seen a couple of days ago for similar symptoms however, LWBS after triage. Pt has a history of migraines and notes that she took her migraine medication this morning at 3 AM with no relief of her symptoms.  Patient started taking medications for her thyroid as well as blood pressure this week.  Has been evaluated by her primary care provider for her symptoms.  Denies vomiting. Review of Systems  Positive: As per HPI Negative:   Physical Exam  BP (!) 141/99 (BP Location: Right Arm)   Pulse 88   Temp 98.1 F (36.7 C) (Oral)   Resp 16   SpO2 98%  Gen:   Awake, no distress   Resp:  Normal effort  MSK:   Moves extremities without difficulty  Other:  PERRLA. EOMI.  Medical Decision Making  Medically screening exam initiated at 12:53 PM.  Appropriate orders placed.  Braylinn Gulden was informed that the remainder of the evaluation will be completed by another provider, this initial triage assessment does not replace that evaluation, and the importance of remaining in the ED until their evaluation is complete.  Work-up initiated.   Marquavis Hannen A, PA-C 07/11/22 1302

## 2022-07-11 NOTE — ED Triage Notes (Signed)
Patient here with complaint of hypertension for several days. Patient states she came a few days ago to be evaluated but left without beign seen due to wait times. Patient complains of headache, is alert, oriented, ambulatory, and in no apparent distress at this time.

## 2022-07-11 NOTE — Discharge Instructions (Signed)
Take Phenergan as needed for headache.  Your CT head and labs were unremarkable today  Please continue taking your blood pressure medicine.  Return to ER if you have worse headache, vomiting, passing out

## 2022-07-30 ENCOUNTER — Other Ambulatory Visit (HOSPITAL_BASED_OUTPATIENT_CLINIC_OR_DEPARTMENT_OTHER): Payer: Self-pay | Admitting: Physician Assistant

## 2022-07-30 DIAGNOSIS — Z1231 Encounter for screening mammogram for malignant neoplasm of breast: Secondary | ICD-10-CM

## 2022-08-09 ENCOUNTER — Ambulatory Visit (HOSPITAL_BASED_OUTPATIENT_CLINIC_OR_DEPARTMENT_OTHER)
Admission: RE | Admit: 2022-08-09 | Discharge: 2022-08-09 | Disposition: A | Discharge status: Home / Self Care | Payer: Medicaid Other | Source: Ambulatory Visit | Attending: Physician Assistant | Admitting: Physician Assistant

## 2022-08-09 DIAGNOSIS — Z1231 Encounter for screening mammogram for malignant neoplasm of breast: Secondary | ICD-10-CM | POA: Diagnosis present

## 2022-10-16 ENCOUNTER — Encounter (HOSPITAL_COMMUNITY): Payer: Self-pay | Admitting: Emergency Medicine

## 2022-10-16 ENCOUNTER — Ambulatory Visit (HOSPITAL_COMMUNITY)
Admission: EM | Admit: 2022-10-16 | Discharge: 2022-10-16 | Disposition: A | Discharge status: Home / Self Care | Payer: Medicaid Other | Attending: Internal Medicine | Admitting: Internal Medicine

## 2022-10-16 ENCOUNTER — Ambulatory Visit (INDEPENDENT_AMBULATORY_CARE_PROVIDER_SITE_OTHER): Payer: Medicaid Other

## 2022-10-16 DIAGNOSIS — M25562 Pain in left knee: Secondary | ICD-10-CM | POA: Diagnosis not present

## 2022-10-16 DIAGNOSIS — M25462 Effusion, left knee: Secondary | ICD-10-CM

## 2022-10-16 DIAGNOSIS — G8929 Other chronic pain: Secondary | ICD-10-CM

## 2022-10-16 MED ORDER — PREDNISONE 20 MG PO TABS: 20.0000 mg | ORAL_TABLET | Freq: Every day | ORAL | 0 refills | Status: AC

## 2022-10-16 MED ORDER — CYCLOBENZAPRINE HCL 10 MG PO TABS: 10.0000 mg | ORAL_TABLET | Freq: Two times a day (BID) | ORAL | 0 refills | Status: DC | PRN

## 2022-10-16 MED ORDER — PREDNISONE 20 MG PO TABS
40.0000 mg | ORAL_TABLET | Freq: Once | ORAL | Status: AC
  Administered 2022-10-16: 40 mg via ORAL

## 2022-10-16 MED ORDER — METHYLPREDNISOLONE 4 MG PO TBPK: ORAL_TABLET | ORAL | 0 refills | Status: DC

## 2022-10-16 MED ORDER — PREDNISONE 20 MG PO TABS
ORAL_TABLET | ORAL | Status: AC
  Filled 2022-10-16: qty 2

## 2022-10-16 NOTE — ED Triage Notes (Signed)
Pt reports left knee pain and swelling x 3 months. States the pain and swelling will sometimes decrease but never fully resolved. Was referred to Ortho by her PCP and her appointment is next month.
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
## 2022-10-16 NOTE — Discharge Instructions (Addendum)
Your x-rays are negative for bony abnormality but does show some small joint space swelling to the knee.   Start taking methylprednisolone steroid taper as prescribed beginning tomorrow since we gave you a dose of steroid in the clinic today.   Rest, ice, elevate, and use the knee brace to compress your knee.  Follow-up with orthopedics as scheduled. Return to PCP or return to urgent care for reevaluation if symptoms fail to improve in the next 3-4 days while taking steroid dose pak.

## 2022-11-17 ENCOUNTER — Emergency Department (HOSPITAL_COMMUNITY)
Admission: EM | Admit: 2022-11-17 | Discharge: 2022-11-18 | Disposition: A | Discharge status: Home / Self Care | Payer: Medicaid Other | Attending: Emergency Medicine | Admitting: Emergency Medicine

## 2022-11-17 ENCOUNTER — Encounter (HOSPITAL_COMMUNITY): Payer: Self-pay | Admitting: Emergency Medicine

## 2022-11-17 ENCOUNTER — Emergency Department (HOSPITAL_COMMUNITY): Payer: Medicaid Other

## 2022-11-17 ENCOUNTER — Other Ambulatory Visit: Payer: Self-pay

## 2022-11-17 DIAGNOSIS — J029 Acute pharyngitis, unspecified: Secondary | ICD-10-CM | POA: Diagnosis present

## 2022-11-17 DIAGNOSIS — Z20822 Contact with and (suspected) exposure to covid-19: Secondary | ICD-10-CM | POA: Diagnosis not present

## 2022-11-17 DIAGNOSIS — J069 Acute upper respiratory infection, unspecified: Secondary | ICD-10-CM | POA: Diagnosis not present

## 2022-11-17 LAB — RESP PANEL BY RT-PCR (FLU A&B, COVID) ARPGX2
Influenza A by PCR: NEGATIVE
Influenza B by PCR: NEGATIVE
SARS Coronavirus 2 by RT PCR: NEGATIVE

## 2022-11-17 LAB — GROUP A STREP BY PCR: Group A Strep by PCR: NOT DETECTED

## 2022-11-17 MED ORDER — ALBUTEROL SULFATE HFA 108 (90 BASE) MCG/ACT IN AERS: 1.0000 | INHALATION_SPRAY | Freq: Four times a day (QID) | RESPIRATORY_TRACT | 0 refills | Status: AC | PRN

## 2022-11-17 MED ORDER — GUAIFENESIN ER 600 MG PO TB12: 600.0000 mg | ORAL_TABLET | Freq: Two times a day (BID) | ORAL | 0 refills | Status: DC | PRN

## 2022-11-17 NOTE — ED Provider Notes (Signed)
[facility] EMERGENCY DEPARTMENT Provider Note   CSN: [id number] Arrival date & time: 11/17/22  2100     History  Chief Complaint  Patient presents with   Sore Throat    Victoria Collier is a 51 y.o. female.  The history is provided by the patient and medical records.  Sore Throat    51 year old female presenting to the ED with 5 days of URI symptoms.  Specifically she has had cough, congestion, sneezing, and sore throat.  She denies any fever or body aches.  She did COVID test herself at home x2, both were negative.  She denies any sick contacts.  She has tried multiple over-the-counter medications without much relief.  Home Medications Prior to Admission medications   Medication Sig Start Date End Date Taking? Authorizing Provider  albuterol (VENTOLIN HFA) 108 (90 Base) MCG/ACT inhaler Inhale 1-2 puffs into the lungs every 6 (six) hours as needed for wheezing. 11/17/22  Yes Larene Pickett, PA-C  guaiFENesin (MUCINEX) 600 MG 12 hr tablet Take 1 tablet (600 mg total) by mouth 2 (two) times daily as needed for cough or to loosen phlegm. 11/17/22  Yes Larene Pickett, PA-C  amLODipine (NORVASC) 5 MG tablet Take 1 tablet (5 mg total) by mouth daily. Patient taking differently: Take 5 mg by mouth daily as needed (high blood pressure). 10/18/21   Jaynee Eagles, PA-C  benzonatate (TESSALON) 100 MG capsule Take 1-2 capsules (100-200 mg total) by mouth 3 (three) times daily as needed for cough. 10/18/21   Jaynee Eagles, PA-C  celecoxib (CELEBREX) 200 MG capsule Take 200 mg by mouth daily. Patient not taking: Reported on 10/04/2021 07/17/21   [provider]  cetirizine (ZYRTEC ALLERGY) 10 MG tablet Take 1 tablet (10 mg total) by mouth daily. Patient taking differently: Take 10 mg by mouth daily as needed for allergies or rhinitis. 10/18/21   Jaynee Eagles, PA-C  cyclobenzaprine (FLEXERIL) 10 MG tablet Take 1 tablet (10 mg total) by mouth 2 (two) times daily as needed  for muscle spasms. 10/16/22   Talbot Grumbling, FNP  gabapentin (NEURONTIN) 300 MG capsule Take 600 mg by mouth 2 (two) times daily as needed (neuropathy). 11/01/21   [provider]  levothyroxine (SYNTHROID) 125 MCG tablet Take 1 tablet (125 mcg total) by mouth daily. Patient not taking: Reported on 01/05/2022 02/08/21     Lidocaine 4 % PTCH Apply 1 patch topically 2 (two) times daily. 01/05/22   Varney Biles, MD  naproxen (NAPROSYN) 500 MG tablet Take 1 tablet (500 mg total) by mouth 2 (two) times daily. 01/05/22   Varney Biles, MD  naratriptan (AMERGE) 2.5 MG tablet Take 1 tablet (2.5 mg total) by mouth 2 (two) times daily for 3 days to break prolonged migraine. Patient taking differently: Take 2.5 mg by mouth as needed for migraine. 03/27/21 01/05/22    omeprazole (PRILOSEC) 20 MG capsule Take 1 capsule (20 mg total) by mouth daily. 03/20/21     oxyCODONE (ROXICODONE) 5 MG immediate release tablet Take 1 tablet (5 mg total) by mouth every 4 (four) hours as needed for severe pain. 08/27/21   Davonna Belling, MD  promethazine (PHENERGAN) 25 MG tablet Take 1 tablet (25 mg total) by mouth every 6 (six) hours as needed for nausea or vomiting. 07/11/22   Drenda Freeze, MD  promethazine-dextromethorphan (PROMETHAZINE-DM) 6.25-15 MG/5ML syrup Take 5 mLs by mouth at bedtime as needed for cough. 10/18/21   Jaynee Eagles, PA-C  pseudoephedrine (SUDAFED) 30  MG tablet Take 1 tablet (30 mg total) by mouth every 8 (eight) hours as needed for congestion. Patient not taking: Reported on 01/05/2022 10/18/21   Jaynee Eagles, PA-C  SUMAtriptan (IMITREX) 25 MG tablet Take 25 mg by mouth 2 (two) times daily as needed for migraine. 08/27/21   [provider]      Allergies    Acetaminophen, Aspirin, Excedrin tension headache [acetaminophen-caffeine], and Darvocet [propoxyphene n-acetaminophen]    Review of Systems   Review of Systems  HENT:  Positive for congestion, rhinorrhea and sore throat.    Respiratory:  Positive for cough.   All other systems reviewed and are negative.   Physical Exam Updated Vital Signs BP (!) 158/106   Pulse 100   Temp 98.6 F (37 C)   Resp 19   SpO2 98%  Physical Exam Vitals and nursing note reviewed.  Constitutional:      Appearance: She is well-developed.  HENT:     Head: Normocephalic and atraumatic.     Right Ear: Tympanic membrane and ear canal normal.     Left Ear: Tympanic membrane and ear canal normal.     Nose: Congestion present.     Mouth/Throat:     Comments: Tonsils overall normal in appearance bilaterally without exudate; uvula midline without evidence of peritonsillar abscess; handling secretions appropriately; no difficulty swallowing or speaking; normal phonation without stridor Eyes:     Conjunctiva/sclera: Conjunctivae normal.     Pupils: Pupils are equal, round, and reactive to light.  Cardiovascular:     Rate and Rhythm: Normal rate and regular rhythm.     Heart sounds: Normal heart sounds.  Pulmonary:     Effort: Pulmonary effort is normal.     Breath sounds: Normal breath sounds.  Abdominal:     General: Bowel sounds are normal.     Palpations: Abdomen is soft.  Musculoskeletal:        General: Normal range of motion.     Cervical back: Normal range of motion.  Skin:    General: Skin is warm and dry.  Neurological:     Mental Status: She is alert and oriented to person, place, and time.     ED Results / Procedures / Treatments   Labs (all labs ordered are listed, but only abnormal results are displayed) Labs Reviewed  RESP PANEL BY RT-PCR (FLU A&B, COVID) [id number]  GROUP A STREP BY PCR    EKG None  Radiology DG Chest 2 View  Result Date: 11/17/2022 CLINICAL DATA:  Cough EXAM: CHEST - 2 VIEW COMPARISON:  07/09/2022 FINDINGS: The heart size and mediastinal contours are within normal limits. Both lungs are clear. The visualized skeletal structures are unremarkable. IMPRESSION: Negative. Electronically  Signed   By: Rolm Baptise M.D.   On: 11/17/2022 22:17    Procedures Procedures    Medications Ordered in ED Medications - No data to display  ED Course/ Medical Decision Making/ A&P                           Medical Decision Making Risk OTC drugs. Prescription drug management.   51 year old female here with 5 days of URI type symptoms.  She is afebrile and nontoxic in appearance here.  Her exam is overall benign aside from some nasal congestion.  She is in no acute respiratory distress.  Rapid strep, COVID/flu testing are all negative.  Chest x-ray is clear.  Discussed with her that this is  likely a viral process and will be self limiting.  She is requesting refill of her asthma inhaler to help her at night when having to wear her CPAP.  Recommended.  Can continue symptomatic care.  Follow-up with PCP.  Return here for new concerns.  Final Clinical Impression(s) / ED Diagnoses Final diagnoses:  Viral URI with cough    Rx / DC Orders ED Discharge Orders          Ordered    albuterol (VENTOLIN HFA) 108 (90 Base) MCG/ACT inhaler  Every 6 hours PRN        11/17/22 2312    guaiFENesin (MUCINEX) 600 MG 12 hr tablet  2 times daily PRN        11/17/22 2312              Larene Pickett, PA-C 11/17/22 [place], Ankit, MD 11/17/22 2355

## 2022-11-17 NOTE — ED Provider Triage Note (Signed)
Emergency Medicine Provider Triage Evaluation Note  Victoria Collier , a 51 y.o. female  was evaluated in triage.  Pt complains of sore throat onset 5 days.  Notes that she drove over prior to the onset of her symptoms.  Has associated rhinorrhea, nasal congestion, sneezing, cough (yellow-green sputum).  Has tried over-the-counter medications for her symptoms.  Denies chest pain.  Review of Systems  Positive: Negative:   Physical Exam  BP (!) 158/106   Pulse 100   Temp 98.6 F (37 C)   Resp 19   SpO2 98%  Gen:   Awake, no distress   Resp:  Normal effort  MSK:   Moves extremities without difficulty  Other:  Uvula midline without swelling.  No posterior pharyngeal erythema or exudate noted.  Patent airway.  Able to speak in clear complete sentences.  Medical Decision Making  Medically screening exam initiated at 9:41 PM.  Appropriate orders placed.  Malloree Raboin was informed that the remainder of the evaluation will be completed by another provider, this initial triage assessment does not replace that evaluation, and the importance of remaining in the ED until their evaluation is complete.     Rayni Nemitz A, PA-C 11/17/22 2149

## 2022-11-17 NOTE — Discharge Instructions (Signed)
Take the prescribed medication as directed.  Continue symptomatic care at home. Follow-up with your primary care doctor. Return to the ED for new or worsening symptoms.

## 2022-11-18 MED ORDER — LIDOCAINE VISCOUS HCL 2 % MT SOLN
15.0000 mL | Freq: Once | OROMUCOSAL | Status: AC
  Administered 2022-11-18: 15 mL via OROMUCOSAL
  Filled 2022-11-18: qty 15

## 2022-11-18 NOTE — ED Notes (Signed)
Provider notified of pt's BP prior to DC. Provider stated it was ok to DC pt with elevated BP.

## 2023-04-17 ENCOUNTER — Other Ambulatory Visit: Payer: Self-pay

## 2023-04-17 ENCOUNTER — Emergency Department (HOSPITAL_COMMUNITY): Payer: Medicaid Other

## 2023-04-17 ENCOUNTER — Emergency Department (HOSPITAL_COMMUNITY)
Admission: EM | Admit: 2023-04-17 | Discharge: 2023-04-17 | Discharge status: Against Medical Advice | Payer: Medicaid Other | Attending: Emergency Medicine | Admitting: Emergency Medicine

## 2023-04-17 DIAGNOSIS — M25462 Effusion, left knee: Secondary | ICD-10-CM | POA: Insufficient documentation

## 2023-04-17 DIAGNOSIS — Z5321 Procedure and treatment not carried out due to patient leaving prior to being seen by health care provider: Secondary | ICD-10-CM | POA: Insufficient documentation

## 2023-04-17 DIAGNOSIS — W010XXA Fall on same level from slipping, tripping and stumbling without subsequent striking against object, initial encounter: Secondary | ICD-10-CM | POA: Insufficient documentation

## 2023-04-17 NOTE — ED Provider Triage Note (Signed)
Emergency Medicine Provider Triage Evaluation Note  Victoria Collier , a 52 y.o. female  was evaluated in triage.  Pt complains of knee swelling after recent fall.  Fall occurred 2 days ago.  Patient was walking from 1 room to the other in her house when she slipped and fell landing on the lateral aspect of her left knee.  Now with swelling and some tenderness in that knee.  States she has not had a history of fluid around that knee and popping.  Now the knee feels like it buckles at times and endorses instability making it difficult to ambulate.  Denies fever chills.  Review of Systems  Positive: See above Negative: See above  Physical Exam  BP 103/79 (BP Location: Right Arm)   Pulse 86   Temp 98.2 F (36.8 C) (Oral)   Resp 18   SpO2 92%  Gen:   Awake, no distress   Resp:  Normal effort  MSK:   Moves extremities without difficulty  Other:    Medical Decision Making  Medically screening exam initiated at 1:53 PM.  Appropriate orders placed.  Betania Dizon was informed that the remainder of the evaluation will be completed by another provider, this initial triage assessment does not replace that evaluation, and the importance of remaining in the ED until their evaluation is complete.  Work up started   Gareth Eagle, PA-C 04/17/23 1354

## 2023-04-17 NOTE — ED Triage Notes (Signed)
Pt reports left knee swelling x 8 months, reports fall with worsening pain/swelling, pt reports she has known 8 pockets of fluids that has not been drawn off

## 2023-06-30 ENCOUNTER — Other Ambulatory Visit: Payer: Self-pay | Admitting: Orthopedic Surgery

## 2023-06-30 DIAGNOSIS — M25562 Pain in left knee: Secondary | ICD-10-CM

## 2023-08-31 ENCOUNTER — Encounter (HOSPITAL_COMMUNITY): Payer: Self-pay

## 2023-08-31 ENCOUNTER — Emergency Department (HOSPITAL_COMMUNITY): Payer: 59

## 2023-08-31 ENCOUNTER — Emergency Department (HOSPITAL_COMMUNITY)
Admission: EM | Admit: 2023-08-31 | Discharge: 2023-08-31 | Disposition: A | Discharge status: Home / Self Care | Payer: 59 | Attending: Emergency Medicine | Admitting: Emergency Medicine

## 2023-08-31 DIAGNOSIS — Z79899 Other long term (current) drug therapy: Secondary | ICD-10-CM | POA: Diagnosis not present

## 2023-08-31 DIAGNOSIS — W182XXA Fall in (into) shower or empty bathtub, initial encounter: Secondary | ICD-10-CM | POA: Diagnosis not present

## 2023-08-31 DIAGNOSIS — Y92002 Bathroom of unspecified non-institutional (private) residence single-family (private) house as the place of occurrence of the external cause: Secondary | ICD-10-CM | POA: Insufficient documentation

## 2023-08-31 DIAGNOSIS — M545 Low back pain, unspecified: Secondary | ICD-10-CM | POA: Insufficient documentation

## 2023-08-31 DIAGNOSIS — W19XXXA Unspecified fall, initial encounter: Secondary | ICD-10-CM

## 2023-08-31 MED ORDER — OXYCODONE HCL 5 MG PO TABS
5.0000 mg | ORAL_TABLET | Freq: Once | ORAL | Status: AC
  Administered 2023-08-31: 5 mg via ORAL
  Filled 2023-08-31: qty 1

## 2023-08-31 MED ORDER — METHOCARBAMOL 500 MG PO TABS
500.0000 mg | ORAL_TABLET | Freq: Once | ORAL | Status: AC
  Administered 2023-08-31: 500 mg via ORAL
  Filled 2023-08-31: qty 1

## 2023-08-31 MED ORDER — METHOCARBAMOL 500 MG PO TABS: 500.0000 mg | ORAL_TABLET | Freq: Two times a day (BID) | ORAL | 0 refills | Status: DC

## 2023-08-31 MED ORDER — OXYCODONE HCL 5 MG PO TABS: 5.0000 mg | ORAL_TABLET | ORAL | 0 refills | Status: DC | PRN
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
## 2023-08-31 NOTE — ED Triage Notes (Signed)
Pt states that she was taking a shower and has a bad knee that gave out, had mechanical fall onto her back. C/o of lower back pain.

## 2023-08-31 NOTE — ED Notes (Signed)
..  The patient is A&OX4, wheeled out of ED via wheelchair, NAD. Pt verbalized understanding of d/c instructions, prescriptions and follow up care.

## 2023-08-31 NOTE — Discharge Instructions (Signed)
Take the prescribed medication as directed.  Can use heat/ice as well. ?Follow-up with your primary care doctor. ?Return to the ED for new or worsening symptoms. ?

## 2023-09-13 ENCOUNTER — Ambulatory Visit
Admission: RE | Admit: 2023-09-13 | Discharge: 2023-09-13 | Disposition: A | Discharge status: Home / Self Care | Payer: 59 | Source: Ambulatory Visit | Attending: Orthopedic Surgery | Admitting: Orthopedic Surgery

## 2023-09-13 DIAGNOSIS — M25562 Pain in left knee: Secondary | ICD-10-CM

## 2023-11-16 ENCOUNTER — Emergency Department (HOSPITAL_COMMUNITY): Payer: 59

## 2023-11-16 ENCOUNTER — Emergency Department (HOSPITAL_COMMUNITY)
Admission: EM | Admit: 2023-11-16 | Discharge: 2023-11-16 | Disposition: A | Discharge status: Home / Self Care | Payer: 59 | Attending: Emergency Medicine | Admitting: Emergency Medicine

## 2023-11-16 ENCOUNTER — Encounter (HOSPITAL_COMMUNITY): Payer: Self-pay | Admitting: Emergency Medicine

## 2023-11-16 ENCOUNTER — Other Ambulatory Visit: Payer: Self-pay

## 2023-11-16 DIAGNOSIS — J45909 Unspecified asthma, uncomplicated: Secondary | ICD-10-CM | POA: Insufficient documentation

## 2023-11-16 DIAGNOSIS — R079 Chest pain, unspecified: Secondary | ICD-10-CM | POA: Diagnosis present

## 2023-11-16 DIAGNOSIS — R0789 Other chest pain: Secondary | ICD-10-CM | POA: Insufficient documentation

## 2023-11-16 LAB — CBC
HCT: 45.3 % (ref 36.0–46.0)
Hemoglobin: 14.8 g/dL (ref 12.0–15.0)
MCH: 29.9 pg (ref 26.0–34.0)
MCHC: 32.7 g/dL (ref 30.0–36.0)
MCV: 91.5 fL (ref 80.0–100.0)
Platelets: 282 10*3/uL (ref 150–400)
RBC: 4.95 MIL/uL (ref 3.87–5.11)
RDW: 13.2 % (ref 11.5–15.5)
WBC: 7.8 10*3/uL (ref 4.0–10.5)
nRBC: 0 % (ref 0.0–0.2)

## 2023-11-16 LAB — BASIC METABOLIC PANEL
Anion gap: 8 (ref 5–15)
BUN: 10 mg/dL (ref 6–20)
CO2: 23 mmol/L (ref 22–32)
Calcium: 9.3 mg/dL (ref 8.9–10.3)
Chloride: 104 mmol/L (ref 98–111)
Creatinine, Ser: 1 mg/dL (ref 0.44–1.00)
GFR, Estimated: >60 mL/min (ref >60)
Glucose, Bld: 83 mg/dL (ref 70–99)
Potassium: 4.2 mmol/L (ref 3.5–5.1)
Sodium: 135 mmol/L (ref 135–145)

## 2023-11-16 LAB — TROPONIN I (HIGH SENSITIVITY)
Troponin I (High Sensitivity): 4 ng/L (ref <18)
Troponin I (High Sensitivity): 3 ng/L (ref <18)

## 2023-11-16 LAB — HCG, SERUM, QUALITATIVE: Preg, Serum: NEGATIVE

## 2023-11-16 MED ORDER — HYDROMORPHONE HCL 1 MG/ML IJ SOLN
1.0000 mg | Freq: Once | INTRAMUSCULAR | Status: AC
  Administered 2023-11-16: 1 mg via INTRAMUSCULAR
  Filled 2023-11-16: qty 1

## 2023-11-16 MED ORDER — TRAMADOL HCL 50 MG PO TABS
50.0000 mg | ORAL_TABLET | Freq: Four times a day (QID) | ORAL | 0 refills | Status: AC | PRN
Start: 2023-11-16 — End: ?

## 2023-11-16 MED ORDER — TRAMADOL HCL 50 MG PO TABS
50.0000 mg | ORAL_TABLET | Freq: Once | ORAL | Status: AC
  Administered 2023-11-16: 50 mg via ORAL
  Filled 2023-11-16: qty 1

## 2023-11-16 MED ORDER — CELECOXIB 100 MG PO CAPS: 100.0000 mg | ORAL_CAPSULE | Freq: Two times a day (BID) | ORAL | 0 refills | Status: AC

## 2023-11-16 NOTE — Discharge Instructions (Signed)
Follow-up with your family doctor in 1 to 2 weeks for recheck if not improving

## 2023-11-16 NOTE — ED Notes (Signed)
Patient stating she does not need to be hooked up to monitoring equipment per EDP

## 2023-11-16 NOTE — ED Triage Notes (Signed)
T BIB [facility] from home for chest wall, back and left shoulder pain x 4 days.  No injuries, no NV. 12- lead unremarkable  132/78 HR 66, 97% RA

## 2023-11-16 NOTE — ED Provider Notes (Signed)
EMERGENCY DEPARTMENT AT [facility] Provider Note   CSN: [id number] Arrival date & time: 11/16/23  1332     History {Add pertinent medical, surgical, social history, OB history to HPI:1} Chief Complaint  Patient presents with   Chest Pain    Victoria Collier is a 52 y.o. female.  Patient has a history of asthma and GERD.  She complains of left-sided chest pain   Chest Pain      Home Medications Prior to Admission medications   Medication Sig Start Date End Date Taking? Authorizing Provider  celecoxib (CELEBREX) 100 MG capsule Take 1 capsule (100 mg total) by mouth 2 (two) times daily. 11/16/23  Yes Bethann Berkshire, MD  traMADol (ULTRAM) 50 MG tablet Take 1 tablet (50 mg total) by mouth every 6 (six) hours as needed. 11/16/23  Yes Bethann Berkshire, MD  albuterol (VENTOLIN HFA) 108 (90 Base) MCG/ACT inhaler Inhale 1-2 puffs into the lungs every 6 (six) hours as needed for wheezing. 11/17/22   Garlon Hatchet, PA-C  amLODipine (NORVASC) 5 MG tablet Take 1 tablet (5 mg total) by mouth daily. Patient taking differently: Take 5 mg by mouth daily as needed (high blood pressure). 10/18/21   Wallis Bamberg, PA-C  benzonatate (TESSALON) 100 MG capsule Take 1-2 capsules (100-200 mg total) by mouth 3 (three) times daily as needed for cough. 10/18/21   Wallis Bamberg, PA-C  cetirizine (ZYRTEC ALLERGY) 10 MG tablet Take 1 tablet (10 mg total) by mouth daily. Patient taking differently: Take 10 mg by mouth daily as needed for allergies or rhinitis. 10/18/21   Wallis Bamberg, PA-C  cyclobenzaprine (FLEXERIL) 10 MG tablet Take 1 tablet (10 mg total) by mouth 2 (two) times daily as needed for muscle spasms. 10/16/22   Carlisle Beers, FNP  gabapentin (NEURONTIN) 300 MG capsule Take 600 mg by mouth 2 (two) times daily as needed (neuropathy). 11/01/21   [provider]  guaiFENesin (MUCINEX) 600 MG 12 hr tablet Take 1 tablet (600 mg total) by mouth 2 (two) times daily as  needed for cough or to loosen phlegm. 11/17/22   Garlon Hatchet, PA-C  levothyroxine (SYNTHROID) 125 MCG tablet Take 1 tablet (125 mcg total) by mouth daily. Patient not taking: Reported on 01/05/2022 02/08/21     Lidocaine 4 % PTCH Apply 1 patch topically 2 (two) times daily. 01/05/22   Derwood Kaplan, MD  methocarbamol (ROBAXIN) 500 MG tablet Take 1 tablet (500 mg total) by mouth 2 (two) times daily. 08/31/23   Garlon Hatchet, PA-C  naproxen (NAPROSYN) 500 MG tablet Take 1 tablet (500 mg total) by mouth 2 (two) times daily. 01/05/22   Derwood Kaplan, MD  naratriptan (AMERGE) 2.5 MG tablet Take 1 tablet (2.5 mg total) by mouth 2 (two) times daily for 3 days to break prolonged migraine. Patient taking differently: Take 2.5 mg by mouth as needed for migraine. 03/27/21 01/05/22    omeprazole (PRILOSEC) 20 MG capsule Take 1 capsule (20 mg total) by mouth daily. 03/20/21     promethazine (PHENERGAN) 25 MG tablet Take 1 tablet (25 mg total) by mouth every 6 (six) hours as needed for nausea or vomiting. 07/11/22   Charlynne Pander, MD  promethazine-dextromethorphan (PROMETHAZINE-DM) 6.25-15 MG/5ML syrup Take 5 mLs by mouth at bedtime as needed for cough. 10/18/21   Wallis Bamberg, PA-C  pseudoephedrine (SUDAFED) 30 MG tablet Take 1 tablet (30 mg total) by mouth every 8 (eight) hours as needed for congestion. Patient not taking:  Reported on 01/05/2022 10/18/21   Wallis Bamberg, PA-C  SUMAtriptan (IMITREX) 25 MG tablet Take 25 mg by mouth 2 (two) times daily as needed for migraine. 08/27/21   [provider]      Allergies    Acetaminophen, Aspirin, Excedrin tension headache [acetaminophen-caffeine], and Darvocet [propoxyphene n-acetaminophen]    Review of Systems   Review of Systems  Cardiovascular:  Positive for chest pain.    Physical Exam Updated Vital Signs BP 113/86   Pulse (!) 56   Temp 97.7 F (36.5 C) (Oral)   Resp 17   Ht 5\' 5"  (1.651 m)   Wt 111.9 kg   SpO2 100%   BMI 41.07 kg/m   Physical Exam  ED Results / Procedures / Treatments   Labs (all labs ordered are listed, but only abnormal results are displayed) Labs Reviewed  BASIC METABOLIC PANEL  CBC  HCG, SERUM, QUALITATIVE  TROPONIN I (HIGH SENSITIVITY)  TROPONIN I (HIGH SENSITIVITY)    EKG EKG Interpretation Date/Time:  "Sunday November 16 2023 13:40:50 EST Ventricular Rate:  69 PR Interval:  174 QRS Duration:  84 QT Interval:  362 QTC Calculation: 387 R Axis:   11  Text Interpretation: Normal sinus rhythm Normal ECG When compared with ECG of 11-Jul-2022 12:30, PREVIOUS ECG IS PRESENT Confirmed by Karman Veney ([id number]) on 11/16/2023 4:10:26 PM  Radiology DG Chest 2 View  Result Date: 11/16/2023 CLINICAL DATA:  Chest pain, left shoulder pain for 4 days EXAM: CHEST - 2 VIEW COMPARISON:  11/17/2022 FINDINGS: Frontal and lateral views of the chest demonstrate an unremarkable cardiac silhouette. No airspace disease, effusion, or pneumothorax. No acute bony abnormalities. IMPRESSION: 1. No acute intrathoracic process. Electronically Signed   By: Michael  Brown M.D.   On: 11/16/2023 14:58    Procedures Procedures  {Document cardiac monitor, telemetry assessment procedure when appropriate:1}  Medications Ordered in ED Medications  traMADol (ULTRAM) tablet 50 mg (50 mg Oral Given 11/16/23 1402)  HYDROmorphone (DILAUDID) injection 1 mg (1 mg Intramuscular Given 11/16/23 1655)    ED Course/ Medical Decision Making/ A&P   {   Click here for ABCD2, HEART and other calculatorsREFRESH Note before signing :1}                              Medical Decision Making Amount and/or Complexity of Data Reviewed Labs: ordered. Radiology: ordered.  Risk Prescription drug management.   Patient with chest wall pain.  She is started on some Celebrex and given Ultram and will follow-up.  With PCP  {Document critical care time when appropriate:1} {Document review of labs and clinical decision tools ie heart  score, Chads2Vasc2 etc:1}  {Document your independent review of radiology images, and any outside records:1} {Document your discussion with family members, caretakers, and with consultants:1} {Document social determinants of health affecting pt's care:1} {Document your decision making why or why not admission, treatments were needed:1} Final Clinical Impression(s) / ED Diagnoses Final diagnoses:  Chest wall pain    Rx / DC Orders ED Discharge Orders          Ordered    celecoxib (CELEBREX) 100 MG capsule  2 times daily        11/16/23 1937    traMADol (ULTRAM) 50 MG tablet  Every 6 hours PRN        11" /17/24 1937

## 2023-11-16 NOTE — ED Provider Triage Note (Signed)
Emergency Medicine Provider Triage Evaluation Note  Victoria Collier , a 52 y.o. female  was evaluated in triage.  Pt complains of chest pain with movement of her left arm.  Review of Systems  Positive: Chest pain Negative: No sweating no shortness of breath  Physical Exam  BP (!) 119/107 (BP Location: Right Arm)   Pulse 74   Temp 97.8 F (36.6 C) (Oral)   Resp 17   SpO2 97%  Gen:   Awake, mild distress Resp:  Normal effort  MSK:   Moves extremities without difficulty but causes pain in her left calf when she moves her left arm Other:    Medical Decision Making  Medically screening exam initiated at 1:48 PM.  Appropriate orders placed.  Angenette Schrand was informed that the remainder of the evaluation will be completed by another provider, this initial triage assessment does not replace that evaluation, and the importance of remaining in the ED until their evaluation is complete.     Bethann Berkshire, MD 11/16/23 1349

## 2023-12-01 ENCOUNTER — Emergency Department (HOSPITAL_COMMUNITY)
Admission: EM | Admit: 2023-12-01 | Discharge: 2023-12-01 | Disposition: A | Discharge status: Home / Self Care | Payer: 59 | Attending: Emergency Medicine | Admitting: Emergency Medicine

## 2023-12-01 ENCOUNTER — Encounter (HOSPITAL_COMMUNITY): Payer: Self-pay

## 2023-12-01 ENCOUNTER — Other Ambulatory Visit: Payer: Self-pay

## 2023-12-01 ENCOUNTER — Emergency Department (HOSPITAL_COMMUNITY): Payer: 59

## 2023-12-01 DIAGNOSIS — R0789 Other chest pain: Secondary | ICD-10-CM | POA: Diagnosis present

## 2023-12-01 LAB — BASIC METABOLIC PANEL
Anion gap: 10 (ref 5–15)
BUN: 14 mg/dL (ref 6–20)
CO2: 23 mmol/L (ref 22–32)
Calcium: 9 mg/dL (ref 8.9–10.3)
Chloride: 105 mmol/L (ref 98–111)
Creatinine, Ser: 1.06 mg/dL — ABNORMAL HIGH (ref 0.44–1.00)
GFR, Estimated: >60 mL/min (ref >60)
Glucose, Bld: 91 mg/dL (ref 70–99)
Potassium: 3.6 mmol/L (ref 3.5–5.1)
Sodium: 138 mmol/L (ref 135–145)

## 2023-12-01 LAB — TROPONIN I (HIGH SENSITIVITY)
Troponin I (High Sensitivity): 4 ng/L (ref <18)
Troponin I (High Sensitivity): 3 ng/L (ref <18)

## 2023-12-01 LAB — CBC WITH DIFFERENTIAL/PLATELET
Abs Immature Granulocytes: 0.01 10*3/uL (ref 0.00–0.07)
Basophils Absolute: 0 10*3/uL (ref 0.0–0.1)
Basophils Relative: 0 %
Eosinophils Absolute: 0.2 10*3/uL (ref 0.0–0.5)
Eosinophils Relative: 2 %
HCT: 42.7 % (ref 36.0–46.0)
Hemoglobin: 13.8 g/dL (ref 12.0–15.0)
Immature Granulocytes: 0 %
Lymphocytes Relative: 36 %
Lymphs Abs: 3.1 10*3/uL (ref 0.7–4.0)
MCH: 29.3 pg (ref 26.0–34.0)
MCHC: 32.3 g/dL (ref 30.0–36.0)
MCV: 90.7 fL (ref 80.0–100.0)
Monocytes Absolute: 0.9 10*3/uL (ref 0.1–1.0)
Monocytes Relative: 10 %
Neutro Abs: 4.5 10*3/uL (ref 1.7–7.7)
Neutrophils Relative %: 52 %
Platelets: 250 10*3/uL (ref 150–400)
RBC: 4.71 MIL/uL (ref 3.87–5.11)
RDW: 13.1 % (ref 11.5–15.5)
WBC: 8.7 10*3/uL (ref 4.0–10.5)
nRBC: 0 % (ref 0.0–0.2)

## 2023-12-01 MED ORDER — PREDNISONE 10 MG PO TABS: 40.0000 mg | ORAL_TABLET | Freq: Every day | ORAL | 0 refills | Status: AC

## 2023-12-01 MED ORDER — KETOROLAC TROMETHAMINE 15 MG/ML IJ SOLN
15.0000 mg | Freq: Once | INTRAMUSCULAR | Status: AC
  Administered 2023-12-01: 15 mg via INTRAVENOUS
  Filled 2023-12-01: qty 1

## 2023-12-01 MED ORDER — MORPHINE SULFATE (PF) 2 MG/ML IV SOLN
2.0000 mg | Freq: Once | INTRAVENOUS | Status: AC
  Administered 2023-12-01: 2 mg via INTRAVENOUS
  Filled 2023-12-01: qty 1

## 2023-12-01 MED ORDER — PREDNISONE 20 MG PO TABS
40.0000 mg | ORAL_TABLET | Freq: Once | ORAL | Status: AC
  Administered 2023-12-01: 40 mg via ORAL
  Filled 2023-12-01: qty 2

## 2023-12-01 NOTE — ED Triage Notes (Signed)
PT coming from home. Pt was in ER previously 2 wks ago for chest pain due to pulled muscle. Pt was given muscle relaxer but is not taking them stating they do not work. Pt is back for chest pain stating pain has gotten worst. No injuries. Denies N/V. Bp 140/80 Hr 80  O2 99

## 2023-12-01 NOTE — ED Provider Notes (Signed)
[place] EMERGENCY DEPARTMENT AT [facility] Provider Note   CSN: [id number] Arrival date & time: 12/01/23  6578     History {Add pertinent medical, surgical, social history, OB history to HPI:1} Chief Complaint  Patient presents with   Chest Wall Pain    Victoria Collier is a 52 y.o. female.  52 year old female with prior medical history as detailed below presents for evaluation.  Patient reports that she was seen approximately 2 weeks ago for same complaint.  She was diagnosed with chest wall pain.  She reports that her pain has not improved significantly since this last evaluation.  She complains of continued anterior chest pain.  Pain is worse with movement or with twisting.  She reports that medications prescribed at last ER visit did help somewhat.  She is out of those medications.  She has not taken anything for symptoms in the last 24 to 48 hours.  The history is provided by the patient and medical records.       Home Medications Prior to Admission medications   Medication Sig Start Date End Date Taking? Authorizing Provider  albuterol (VENTOLIN HFA) 108 (90 Base) MCG/ACT inhaler Inhale 1-2 puffs into the lungs every 6 (six) hours as needed for wheezing. 11/17/22   Garlon Hatchet, PA-C  amLODipine (NORVASC) 5 MG tablet Take 1 tablet (5 mg total) by mouth daily. Patient taking differently: Take 5 mg by mouth daily as needed (high blood pressure). 10/18/21   Wallis Bamberg, PA-C  benzonatate (TESSALON) 100 MG capsule Take 1-2 capsules (100-200 mg total) by mouth 3 (three) times daily as needed for cough. 10/18/21   Wallis Bamberg, PA-C  celecoxib (CELEBREX) 100 MG capsule Take 1 capsule (100 mg total) by mouth 2 (two) times daily. 11/16/23   Bethann Berkshire, MD  cetirizine (ZYRTEC ALLERGY) 10 MG tablet Take 1 tablet (10 mg total) by mouth daily. Patient taking differently: Take 10 mg by mouth daily as needed for allergies or rhinitis. 10/18/21   Wallis Bamberg, PA-C   cyclobenzaprine (FLEXERIL) 10 MG tablet Take 1 tablet (10 mg total) by mouth 2 (two) times daily as needed for muscle spasms. 10/16/22   Carlisle Beers, FNP  gabapentin (NEURONTIN) 300 MG capsule Take 600 mg by mouth 2 (two) times daily as needed (neuropathy). 11/01/21   [provider]  guaiFENesin (MUCINEX) 600 MG 12 hr tablet Take 1 tablet (600 mg total) by mouth 2 (two) times daily as needed for cough or to loosen phlegm. 11/17/22   Garlon Hatchet, PA-C  levothyroxine (SYNTHROID) 125 MCG tablet Take 1 tablet (125 mcg total) by mouth daily. Patient not taking: Reported on 01/05/2022 02/08/21     Lidocaine 4 % PTCH Apply 1 patch topically 2 (two) times daily. 01/05/22   Derwood Kaplan, MD  methocarbamol (ROBAXIN) 500 MG tablet Take 1 tablet (500 mg total) by mouth 2 (two) times daily. 08/31/23   Garlon Hatchet, PA-C  naproxen (NAPROSYN) 500 MG tablet Take 1 tablet (500 mg total) by mouth 2 (two) times daily. 01/05/22   Derwood Kaplan, MD  naratriptan (AMERGE) 2.5 MG tablet Take 1 tablet (2.5 mg total) by mouth 2 (two) times daily for 3 days to break prolonged migraine. Patient taking differently: Take 2.5 mg by mouth as needed for migraine. 03/27/21 01/05/22    omeprazole (PRILOSEC) 20 MG capsule Take 1 capsule (20 mg total) by mouth daily. 03/20/21     promethazine (PHENERGAN) 25 MG tablet Take 1 tablet (25 mg  total) by mouth every 6 (six) hours as needed for nausea or vomiting. 07/11/22   Charlynne Pander, MD  promethazine-dextromethorphan (PROMETHAZINE-DM) 6.25-15 MG/5ML syrup Take 5 mLs by mouth at bedtime as needed for cough. 10/18/21   Wallis Bamberg, PA-C  pseudoephedrine (SUDAFED) 30 MG tablet Take 1 tablet (30 mg total) by mouth every 8 (eight) hours as needed for congestion. Patient not taking: Reported on 01/05/2022 10/18/21   Wallis Bamberg, PA-C  SUMAtriptan (IMITREX) 25 MG tablet Take 25 mg by mouth 2 (two) times daily as needed for migraine. 08/27/21   [provider]   traMADol (ULTRAM) 50 MG tablet Take 1 tablet (50 mg total) by mouth every 6 (six) hours as needed. 11/16/23   Bethann Berkshire, MD      Allergies    Acetaminophen, Aspirin, Excedrin tension headache [acetaminophen-caffeine], and Darvocet [propoxyphene n-acetaminophen]    Review of Systems   Review of Systems  All other systems reviewed and are negative.   Physical Exam Updated Vital Signs BP (!) 144/79   Pulse 63   Temp 97.9 F (36.6 C)   Resp 18   Ht 5\' 5"  (1.651 m)   Wt 111.6 kg   SpO2 99%   BMI 40.94 kg/m  Physical Exam Vitals and nursing note reviewed.  Constitutional:      General: She is not in acute distress.    Appearance: Normal appearance. She is well-developed.  HENT:     Head: Normocephalic and atraumatic.  Eyes:     Conjunctiva/sclera: Conjunctivae normal.     Pupils: Pupils are equal, round, and reactive to light.  Cardiovascular:     Rate and Rhythm: Normal rate and regular rhythm.     Heart sounds: Normal heart sounds.  Pulmonary:     Effort: Pulmonary effort is normal. No respiratory distress.     Breath sounds: Normal breath sounds.  Abdominal:     General: There is no distension.     Palpations: Abdomen is soft.     Tenderness: There is no abdominal tenderness.  Musculoskeletal:        General: Tenderness present. No deformity. Normal range of motion.     Cervical back: Normal range of motion and neck supple.     Comments: Discrete reproducible tenderness of the anterior chest wall.  Patient's symptoms are reproduced entirely with palpation of the anterior chest.  Skin:    General: Skin is warm and dry.  Neurological:     General: No focal deficit present.     Mental Status: She is alert and oriented to person, place, and time.     ED Results / Procedures / Treatments   Labs (all labs ordered are listed, but only abnormal results are displayed) Labs Reviewed  CBC WITH DIFFERENTIAL/PLATELET  BASIC METABOLIC PANEL  TROPONIN I (HIGH  SENSITIVITY)    EKG EKG Interpretation Date/Time:  Monday December 01 2023 08:04:08 EST Ventricular Rate:  58 PR Interval:  187 QRS Duration:  104 QT Interval:  397 QTC Calculation: 390 R Axis:   6  Text Interpretation: Sinus rhythm Confirmed by Kristine Royal [id number]) on 12/01/2023 8:06:13 AM  Radiology No results found.  Procedures Procedures  {Document cardiac monitor, telemetry assessment procedure when appropriate:1}  Medications Ordered in ED Medications  morphine (PF) 2 MG/ML injection 2 mg (has no administration in time range)    ED Course/ Medical Decision Making/ A&P   {   Click here for ABCD2, HEART and other calculatorsREFRESH Note before signing :1}  Medical Decision Making Amount and/or Complexity of Data Reviewed Labs: ordered. Radiology: ordered.  Risk Prescription drug management.    Medical Screen Complete  This patient presented to the ED with complaint of ***.  This complaint involves an extensive number of treatment options. The initial differential diagnosis includes, but is not limited to, ***  This presentation is: {IllnessRisk:19196::"***","Acute","Chronic","Self-Limited","Previously Undiagnosed","Uncertain Prognosis","Complicated","Systemic Symptoms","Threat to Life/Bodily Function"}    Co morbidities that complicated the patient's evaluation  ***   Additional history obtained:  Additional history obtained from {History source:19196::"EMS","Spouse","Family","Friend","Caregiver"} External records from outside sources obtained and reviewed including prior ED visits and prior Inpatient records.    Lab Tests:  I ordered and personally interpreted labs.  The pertinent results include:  ***   Imaging Studies ordered:  I ordered imaging studies including ***  I independently visualized and interpreted obtained imaging which showed *** I agree with the radiologist interpretation.   Cardiac  Monitoring:  The patient was maintained on a cardiac monitor.  I personally viewed and interpreted the cardiac monitor which showed an underlying rhythm of: ***   Medicines ordered:  I ordered medication including ***  for ***  Reevaluation of the patient after these medicines showed that the patient: {resolved/improved/worsened:23923::"improved"}    Test Considered:  ***   Critical Interventions:  ***   Consultations Obtained:  I consulted ***,  and discussed lab and imaging findings as well as pertinent plan of care.    Problem List / ED Course:  ***   Reevaluation:  After the interventions noted above, I reevaluated the patient and found that they have: {resolved/improved/worsened:23923::"improved"}   Social Determinants of Health:  ***   Disposition:  After consideration of the diagnostic results and the patients response to treatment, I feel that the patent would benefit from ***.    {Document critical care time when appropriate:1} {Document review of labs and clinical decision tools ie heart score, Chads2Vasc2 etc:1}  {Document your independent review of radiology images, and any outside records:1} {Document your discussion with family members, caretakers, and with consultants:1} {Document social determinants of health affecting pt's care:1} {Document your decision making why or why not admission, treatments were needed:1} Final Clinical Impression(s) / ED Diagnoses Final diagnoses:  None    Rx / DC Orders ED Discharge Orders     None

## 2023-12-01 NOTE — Discharge Instructions (Signed)
Return for any problem.  ?

## 2024-02-05 ENCOUNTER — Encounter (HOSPITAL_COMMUNITY): Payer: Self-pay

## 2024-02-05 ENCOUNTER — Emergency Department (HOSPITAL_COMMUNITY)
Admission: EM | Admit: 2024-02-05 | Discharge: 2024-02-05 | Disposition: A | Discharge status: Home / Self Care | Payer: 59 | Attending: Student | Admitting: Student

## 2024-02-05 ENCOUNTER — Other Ambulatory Visit: Payer: Self-pay

## 2024-02-05 DIAGNOSIS — Z20822 Contact with and (suspected) exposure to covid-19: Secondary | ICD-10-CM | POA: Insufficient documentation

## 2024-02-05 DIAGNOSIS — J210 Acute bronchiolitis due to respiratory syncytial virus: Secondary | ICD-10-CM | POA: Insufficient documentation

## 2024-02-05 DIAGNOSIS — J45909 Unspecified asthma, uncomplicated: Secondary | ICD-10-CM | POA: Diagnosis not present

## 2024-02-05 DIAGNOSIS — E039 Hypothyroidism, unspecified: Secondary | ICD-10-CM | POA: Diagnosis not present

## 2024-02-05 DIAGNOSIS — R059 Cough, unspecified: Secondary | ICD-10-CM | POA: Diagnosis present

## 2024-02-05 LAB — RESP PANEL BY RT-PCR (RSV, FLU A&B, COVID)  RVPGX2
Influenza A by PCR: NEGATIVE
Influenza B by PCR: NEGATIVE
Resp Syncytial Virus by PCR: POSITIVE — AB
SARS Coronavirus 2 by RT PCR: NEGATIVE

## 2024-02-05 MED ORDER — KETOROLAC TROMETHAMINE 15 MG/ML IJ SOLN
15.0000 mg | Freq: Once | INTRAMUSCULAR | Status: AC
  Administered 2024-02-05: 15 mg via INTRAMUSCULAR
  Filled 2024-02-05: qty 1

## 2024-02-05 MED ORDER — PSEUDOEPHEDRINE HCL ER 120 MG PO TB12: 120.0000 mg | ORAL_TABLET | Freq: Two times a day (BID) | ORAL | 0 refills | Status: AC

## 2024-02-05 NOTE — ED Triage Notes (Signed)
 Pt reports non productive cough, runny nose, nose congestion, all over body aches, and minor headache, onset Friday night. Denies fevers. Home covid tests were negative.
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
